# Patient Record
Sex: Female | Born: 1995 | Race: White | Hispanic: No | Marital: Married | Smoking: Never smoker
Health system: Southern US, Community
[De-identification: ages and names within clinical notes are randomized; demographics above are authoritative.]

## PROBLEM LIST (undated history)

## (undated) DIAGNOSIS — T7840XA Allergy, unspecified, initial encounter: Secondary | ICD-10-CM

## (undated) DIAGNOSIS — O139 Gestational [pregnancy-induced] hypertension without significant proteinuria, unspecified trimester: Secondary | ICD-10-CM

## (undated) DIAGNOSIS — J45909 Unspecified asthma, uncomplicated: Secondary | ICD-10-CM

## (undated) DIAGNOSIS — F419 Anxiety disorder, unspecified: Secondary | ICD-10-CM

## (undated) DIAGNOSIS — M2550 Pain in unspecified joint: Secondary | ICD-10-CM

## (undated) DIAGNOSIS — K219 Gastro-esophageal reflux disease without esophagitis: Secondary | ICD-10-CM

## (undated) DIAGNOSIS — D649 Anemia, unspecified: Secondary | ICD-10-CM

## (undated) DIAGNOSIS — E785 Hyperlipidemia, unspecified: Secondary | ICD-10-CM

## (undated) DIAGNOSIS — G935 Compression of brain: Secondary | ICD-10-CM

## (undated) DIAGNOSIS — M549 Dorsalgia, unspecified: Secondary | ICD-10-CM

## (undated) DIAGNOSIS — Z8711 Personal history of peptic ulcer disease: Secondary | ICD-10-CM

## (undated) DIAGNOSIS — R12 Heartburn: Secondary | ICD-10-CM

## (undated) DIAGNOSIS — Z8719 Personal history of other diseases of the digestive system: Secondary | ICD-10-CM

## (undated) HISTORY — DX: Gastro-esophageal reflux disease without esophagitis: K21.9

## (undated) HISTORY — DX: Allergy, unspecified, initial encounter: T78.40XA

## (undated) HISTORY — DX: Unspecified asthma, uncomplicated: J45.909

## (undated) HISTORY — PX: WISDOM TOOTH EXTRACTION: SHX21

## (undated) HISTORY — DX: Dorsalgia, unspecified: M54.9

## (undated) HISTORY — DX: Personal history of peptic ulcer disease: Z87.11

## (undated) HISTORY — DX: Compression of brain: G93.5

## (undated) HISTORY — DX: Anxiety disorder, unspecified: F41.9

## (undated) HISTORY — DX: Heartburn: R12

## (undated) HISTORY — DX: Pain in unspecified joint: M25.50

## (undated) HISTORY — DX: Hyperlipidemia, unspecified: E78.5

---
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
## 2016-04-10 DIAGNOSIS — F419 Anxiety disorder, unspecified: Secondary | ICD-10-CM | POA: Insufficient documentation

## 2017-10-23 DIAGNOSIS — J019 Acute sinusitis, unspecified: Secondary | ICD-10-CM | POA: Diagnosis not present

## 2017-11-02 ENCOUNTER — Encounter: Payer: Self-pay | Admitting: Pediatrics

## 2017-11-02 ENCOUNTER — Ambulatory Visit (INDEPENDENT_AMBULATORY_CARE_PROVIDER_SITE_OTHER): Payer: BLUE CROSS/BLUE SHIELD | Admitting: Pediatrics

## 2017-11-02 VITALS — BP 130/80 | HR 97 | Temp 98.5°F | Resp 18 | Ht 67.5 in | Wt 279.8 lb

## 2017-11-02 DIAGNOSIS — H1010 Acute atopic conjunctivitis, unspecified eye: Secondary | ICD-10-CM | POA: Diagnosis not present

## 2017-11-02 DIAGNOSIS — J4530 Mild persistent asthma, uncomplicated: Secondary | ICD-10-CM | POA: Diagnosis not present

## 2017-11-02 DIAGNOSIS — J3089 Other allergic rhinitis: Secondary | ICD-10-CM | POA: Diagnosis not present

## 2017-11-02 DIAGNOSIS — J302 Other seasonal allergic rhinitis: Secondary | ICD-10-CM | POA: Insufficient documentation

## 2017-11-02 HISTORY — DX: Acute atopic conjunctivitis, unspecified eye: H10.10

## 2017-11-02 HISTORY — DX: Mild persistent asthma, uncomplicated: J45.30

## 2017-11-02 HISTORY — DX: Other seasonal allergic rhinitis: J30.2

## 2017-11-02 MED ORDER — MONTELUKAST SODIUM 10 MG PO TABS: ORAL_TABLET | ORAL | 5 refills | Status: DC

## 2017-11-02 NOTE — Progress Notes (Signed)
[address] [place] [address] Dept: [phone]  New Patient Note  Patient ID: Ashley Acevedo, female    DOB: 1995/11/18  Age: 22 y.o. MRN: [id number] Date of Office Visit: 11/02/2017 Referring provider: No referring provider defined for this encounter.    Chief Complaint: Allergic Rhinitis   HPI Ashley Acevedo presents for evaluation of a allergic rhinitis since childhood. Her symptoms have been worse in the past 2 years. Her symptoms are perennial but worse in the spring and fall of the year. She has had seasonal asthma since age 72. She has aggravation of her symptoms on exposure to dust , cigarette smoke,  cats and dogs. Her boyfriend has a cat. She has 2 dogs in the home. Pineapple makes her mouth itch but no other allergic symptoms from it. She does not have a history of eczema or chronic hives.   Review of Systems  Constitutional: Negative.   HENT:       Allergic rhinitis since childhood  Eyes:       Itchy at times  Respiratory:       Asthmatic difficulties since age 40. Pneumonia once at age  21  Cardiovascular: Negative.   Gastrointestinal: Negative.   Genitourinary: Negative.   Musculoskeletal: Negative.   Skin: Negative.   Neurological: Negative.   Endo/Heme/Allergies:       No diabetes or thyroid disease  Psychiatric/Behavioral: Negative.     Outpatient Encounter Medications as of 11/02/2017  Medication Sig  . albuterol (PROAIR HFA) 108 (90 Base) MCG/ACT inhaler Inhale 2 puffs into the lungs every 6 (six) hours as needed for wheezing or shortness of breath.  . cetirizine (ZYRTEC) 10 MG tablet Take 10 mg by mouth daily.  . drospirenone-ethinyl estradiol (YAZ,GIANVI,LORYNA) 3-0.02 MG tablet Take 1 tablet by mouth daily.  . fluticasone (FLONASE) 50 MCG/ACT nasal spray Place 1 spray into both nostrils daily.  . Multiple Vitamins-Minerals (MULTIVITAMIN ADULT) CHEW Chew by mouth.  . montelukast (SINGULAIR) 10 MG tablet Take 1 tablet once a day for coughing or  wheezing   No facility-administered encounter medications on file as of 11/02/2017.      Drug Allergies:  Allergies  Allergen Reactions  . Codeine Rash  . Hydrocodone Rash    Family History: [name]'s family history includes Asthma in her maternal grandmother and mother... Sinus problems in her mother. There is no family history of hayfever, angioedema, eczema, hives, food allergies, chronic bronchitis or emphysema.  Social and environmental. She has 2 dogs indoors. Her boyfriend has a cat . She is not exposed to cigarette smoking. She has never smoked cigarettes. She works indoors. Her symptoms are not worse at work.  Physical Exam: BP 130/80   Pulse 97   Temp 98.5 F (36.9 C) (Oral)   Resp 18   Ht 5' 7.5" (1.715 m)   Wt 279 lb 12.8 oz (126.9 kg)   SpO2 98%   BMI 43.18 kg/m    Physical Exam  Constitutional: She is oriented to person, place, and time. She appears well-developed and well-nourished.  HENT:  Eyes normal. Ears normal. Nose moderate swelling of nasal turbinates. Pharynx normal.  Neck: Neck supple. No thyromegaly present.  Cardiovascular:  S1 and S2 normal no murmurs  Pulmonary/Chest:  Clear to percussion and auscultation  Abdominal: Soft. There is no tenderness (no hepatosplenomegaly).  Lymphadenopathy:    She has no cervical adenopathy.  Neurological: She is alert and oriented to person, place, and time.  Skin:  Clear  Psychiatric:  She has a normal mood and affect. Her behavior is normal. Judgment and thought content normal.  Vitals reviewed.   Diagnostics: FVC 4.25 L FEV1 2.93 L. Predicted FVC 4.31 L predicted FEV1 3.69 L. After albuterol 2 puffs FVC 4.29 L FEV1 3.13 L-this shows a mild reduction in the FEV1 with slight improvement after albuterol  Allergy skin tests were positive to grass pollens, weeds, tree pollens, dust mite, cat, dog, horse and some molds. Skin testing to pineapple was negative but result not always diagnostics for pineapple  allergy   Assessment  Assessment and Plan: 1. Mild persistent asthma without complication   2. Other allergic rhinitis   3. Seasonal allergic conjunctivitis     Meds ordered this encounter  Medications  . montelukast (SINGULAIR) 10 MG tablet    Sig: Take 1 tablet once a day for coughing or wheezing    Dispense:  30 tablet    Refill:  5    Patient Instructions  Environmental control of dust mite Keep the animals out of her bedroom Zyrtec 10 mg-take 1 tablet once a day for runny nose or itchy eyes Fluticasone 2 sprays per nostril at night for stuffy nose Opcon-A one drop 3 times a day if needed for itchy eyes Montelukast 10 mg-take 1 tablet once a day for coughing or wheezing Pro-air 2 puffs every 4 hours if needed for wheezing or coughing spells Add prednisone 20 mg twice a day for 3 days, 20 mg on the fourth day, 10 mg on the fifth day to bring your allergic symptoms under control Call me if you are not doing well on this treatment plan Avoid pineapples   Return in about 4 weeks (around 11/30/2017).   Thank you for the opportunity to care for this patient.  Please do not hesitate to contact me with questions.  Tonette BihariJ. A. Mililani Murthy, M.D.  [facility] [address] [place], [place] [address] [phone]

## 2017-11-02 NOTE — Patient Instructions (Addendum)
Environmental control of dust mite Keep the animals out of her bedroom Zyrtec 10 mg-take 1 tablet once a day for runny nose or itchy eyes Fluticasone 2 sprays per nostril at night for stuffy nose Opcon-A one drop 3 times a day if needed for itchy eyes Montelukast 10 mg-take 1 tablet once a day for coughing or wheezing Pro-air 2 puffs every 4 hours if needed for wheezing or coughing spells Add prednisone 20 mg twice a day for 3 days, 20 mg on the fourth day, 10 mg on the fifth day to bring your allergic symptoms under control Call me if you are not doing well on this treatment plan Avoid pineapples

## 2017-11-24 DIAGNOSIS — R509 Fever, unspecified: Secondary | ICD-10-CM | POA: Diagnosis not present

## 2017-11-24 DIAGNOSIS — J4520 Mild intermittent asthma, uncomplicated: Secondary | ICD-10-CM | POA: Diagnosis not present

## 2017-11-24 DIAGNOSIS — J101 Influenza due to other identified influenza virus with other respiratory manifestations: Secondary | ICD-10-CM | POA: Diagnosis not present

## 2017-12-03 ENCOUNTER — Encounter: Payer: Self-pay | Admitting: Allergy

## 2017-12-03 ENCOUNTER — Ambulatory Visit (INDEPENDENT_AMBULATORY_CARE_PROVIDER_SITE_OTHER): Payer: BLUE CROSS/BLUE SHIELD | Admitting: Allergy

## 2017-12-03 VITALS — BP 130/78 | HR 94 | Resp 18

## 2017-12-03 DIAGNOSIS — H1010 Acute atopic conjunctivitis, unspecified eye: Secondary | ICD-10-CM

## 2017-12-03 DIAGNOSIS — J101 Influenza due to other identified influenza virus with other respiratory manifestations: Secondary | ICD-10-CM | POA: Diagnosis not present

## 2017-12-03 DIAGNOSIS — J3089 Other allergic rhinitis: Secondary | ICD-10-CM | POA: Diagnosis not present

## 2017-12-03 DIAGNOSIS — J4530 Mild persistent asthma, uncomplicated: Secondary | ICD-10-CM

## 2017-12-03 DIAGNOSIS — T781XXD Other adverse food reactions, not elsewhere classified, subsequent encounter: Secondary | ICD-10-CM

## 2017-12-03 NOTE — Patient Instructions (Signed)
Asthma    - Montelukast 10mg  daily    - Pro-air 2 puffs every 4 hours if needed for wheezing or coughing spells Asthma control goals:   Full participation in all desired activities (may need albuterol before activity)  Albuterol use two time or less a week on average (not counting use with activity)  Cough interfering with sleep two time or less a month  Oral steroids no more than once a year  No hospitalizations   Allergic rhinoconjunctivitis    - Environmental control of dust mite    - Keep the animals out of her bedroom    - Zyrtec 10 mg-take 1 tablet once a day for runny nose or itchy eyes    - Fluticasone 2 sprays per nostril at night for stuffy nose    - Opcon-A one drop 3 times a day if needed for itchy eyes  Oral allergy syndrome    - Avoid pineapples   Follow-up 3 months or sooner if needed

## 2017-12-03 NOTE — Progress Notes (Signed)
Follow-up Note  RE: Ashley Acevedo MRN: [id number] DOB: 04/03/1996 Date of Office Visit: 12/03/2017   History of present illness: Ashley Acevedo is a 22 y.o. female presenting today for follow-up of  asthma and allergic rhinoconjunctivitis.  She was last seen in the office on November 02, 2017 by Dr. Beaulah DinningBardelas.  At that time she was having issues with her breathing she was provided with a prednisone 5-day course as well as started on Singulair.  She states she did not notice a difference in her breathing after starting on Singulair.  However last week she developed flulike symptoms and did test positive for flu A.  She completed Tamiflu course.  She states that her boyfriend was sick first with cold-like symptoms that now she knows was flu symptoms.  She states she has improved but she still has some nighttime cough.  She is not needing to use cough medicine anymore.  She also has not had any further fever since initial onset of symptoms.  She does take Zyrtec and Flonase daily.  He has not needed to use her eyedrop.  Continues to avoid pineapples. She is potentially interested in allergy shots as her boyfriend does have a cat.  She states however the cat belongs to his parents and they will be out of the country for 2 years.  She also states that the cat is quite old and thus the cat may die before the parents return to reconnect with the cat.  She is not interested in having any cats in the future.  Review of systems: Review of Systems  Constitutional: Positive for fever. Negative for chills and malaise/fatigue.  HENT: Positive for congestion. Negative for ear discharge, ear pain, nosebleeds, sinus pain, sore throat and tinnitus.   Eyes: Negative for pain, discharge and redness.  Respiratory: Positive for cough and shortness of breath.   Cardiovascular: Negative for chest pain.  Gastrointestinal: Negative for abdominal pain, constipation, diarrhea, heartburn, nausea and vomiting.    Musculoskeletal: Positive for myalgias. Negative for joint pain.  Skin: Negative for itching and rash.  Neurological: Negative for headaches.    All other systems negative unless noted above in HPI  Past medical/social/surgical/family history have been reviewed and are unchanged unless specifically indicated below.  No changes  Medication List: Allergies as of 12/03/2017      Reactions   Codeine Rash   Hydrocodone Rash      Medication List        Accurate as of 12/03/17 12:26 PM. Always use your most recent med list.          cetirizine 10 MG tablet Commonly known as:  ZYRTEC Take 10 mg by mouth daily.   drospirenone-ethinyl estradiol 3-0.02 MG tablet Commonly known as:  YAZ,GIANVI,LORYNA Take 1 tablet by mouth daily.   fluticasone 50 MCG/ACT nasal spray Commonly known as:  FLONASE Place 1 spray into both nostrils daily.   montelukast 10 MG tablet Commonly known as:  SINGULAIR Take 1 tablet once a day for coughing or wheezing   MULTIVITAMIN ADULT Chew Chew by mouth.   PROAIR HFA 108 (90 Base) MCG/ACT inhaler Generic drug:  albuterol Inhale 2 puffs into the lungs every 6 (six) hours as needed for wheezing or shortness of breath.       Known medication allergies: Allergies  Allergen Reactions  . Codeine Rash  . Hydrocodone Rash     Physical examination: Blood pressure 130/78, pulse 94, resp. rate 18, SpO2 95 %.  General: Alert, interactive, in no acute distress. HEENT: PERRLA, TMs pearly gray, turbinates mildly edematous with clear discharge, post-pharynx non erythematous. Neck: Supple without lymphadenopathy. Lungs: Clear to auscultation without wheezing, rhonchi or rales. {no increased work of breathing. CV: Normal S1, S2 without murmurs. Abdomen: Nondistended, nontender. Skin: Warm and dry, without lesions or rashes. Extremities:  No clubbing, cyanosis or edema. Neuro:   Grossly intact.  Diagnositics/Labs:  Spirometry: FEV1: 3.07L 108%, FVC:  4.03L 116%, ratio consistent with Nonobstructive pattern  Assessment and plan:   Asthma, mild persistent    - Montelukast 10mg  daily    - Pro-air 2 puffs every 4 hours if needed for wheezing or coughing spells Asthma control goals:   Full participation in all desired activities (may need albuterol before activity)  Albuterol use two time or less a week on average (not counting use with activity)  Cough interfering with sleep two time or less a month  Oral steroids no more than once a year  No hospitalizations   Allergic rhinoconjunctivitis    - Environmental control of dust mite    - Keep the animals out of her bedroom    - Zyrtec 10 mg-take 1 tablet once a day for runny nose or itchy eyes    - Fluticasone 2 sprays per nostril at night for stuffy nose    - Opcon-A one drop 3 times a day if needed for itchy eyes    -Allergen immunotherapy discussed today including protocol, benefits and risks.  Also provided with the informational packet.  She will think over this option as she knows she cats exposure in the home is only temporary.    Oral allergy syndrome    - Avoid pineapples  Recent influenza type A   -Completed Tamiflu course.  She is improving.  She still has some residual cough  Follow-up 3 months or sooner if needed   I appreciate the opportunity to take part in [name]'s care. Please do not hesitate to contact me with questions.  Sincerely,   Margo Aye, MD Allergy/Immunology [facility]

## 2017-12-11 ENCOUNTER — Ambulatory Visit: Payer: BLUE CROSS/BLUE SHIELD | Admitting: Podiatry

## 2018-01-06 DIAGNOSIS — L600 Ingrowing nail: Secondary | ICD-10-CM | POA: Diagnosis not present

## 2018-03-09 ENCOUNTER — Telehealth: Payer: Self-pay | Admitting: Allergy

## 2018-03-09 NOTE — Telephone Encounter (Signed)
Patient may have a sunscreen allergy?? Can this be tested?? Please call patient to answer any questions or her breakouts

## 2018-03-09 NOTE — Telephone Encounter (Signed)
I spoke with patient and she said it is no one sunscreen that she has the issue with. She has a red raised rash. I have her scheduled 03-22-18 for office visit and a possible patch placement. Please advise with any further information or recommendations.

## 2018-03-09 NOTE — Telephone Encounter (Signed)
If she can make of the sunscreens she has developed a rash with that will be helpful.  There are 2 major types of sunscreen the chemical sunscreen vs physical blockers.  It would be helpful if she has this information for the visit.  And if she wants to perform patch testing to the sunscreens she has reacted to she can bring then in for the visit.

## 2018-03-09 NOTE — Telephone Encounter (Signed)
Patient has been advised and will do as recommended.

## 2018-03-22 ENCOUNTER — Ambulatory Visit (INDEPENDENT_AMBULATORY_CARE_PROVIDER_SITE_OTHER): Payer: BLUE CROSS/BLUE SHIELD | Admitting: Allergy

## 2018-03-22 ENCOUNTER — Encounter: Payer: Self-pay | Admitting: Allergy

## 2018-03-22 VITALS — BP 116/84 | HR 76 | Resp 20

## 2018-03-22 DIAGNOSIS — J3089 Other allergic rhinitis: Secondary | ICD-10-CM

## 2018-03-22 DIAGNOSIS — L253 Unspecified contact dermatitis due to other chemical products: Secondary | ICD-10-CM

## 2018-03-22 DIAGNOSIS — J4530 Mild persistent asthma, uncomplicated: Secondary | ICD-10-CM | POA: Diagnosis not present

## 2018-03-22 DIAGNOSIS — T781XXD Other adverse food reactions, not elsewhere classified, subsequent encounter: Secondary | ICD-10-CM | POA: Diagnosis not present

## 2018-03-22 NOTE — Progress Notes (Signed)
Follow-up Note  RE: Ashley Acevedo MRN: [id number] DOB: 27-Nov-1995 Date of Office Visit: 03/22/2018   History of present illness: Ashley Acevedo is a 22 y.o. female presenting today for evaluation of sunscreen allergy.  She was last seen in the office on December 03, 2017 by myself.  She has a history of asthma, allergic rhinoconjunctivitis and oral allergy syndrome to pineapples.  She states when she was a child there was a sunscreen that she had a reaction to and she stopped using that particular sunscreen.  Then again last summer she reported using 2 different types of sunscreens and developed a very itchy bumpy rash all over her body with sun exposure.  She was seen at an urgent care and was prescribed prednisone for the rash.  She avoided asthma sun exposure as she could and did not use any more of the sunscreen.  The most recently over [facility] she reports using the Sun Bum brand sunscreen and she used a different one on her face than she did for her body.  The one she used on her face cause her to break out again similarly to the rash she had last summer.  She did not break out on the areas on her body where she put the different sunscreen on.  That she is concerned for sunscreen allergy and is here for patch testing today.   She has brought with her 6 different types of sunscreen that she has used in the past 1 of them she recalls not having any symptoms with the rest that she has reacted to. In regards to her other atopic issues she states that her asthma is doing very well on Singulair alone.  She really uses her albuterol and does recall using it this past weekend while she was off her antihistamines and staying at her parents house that have a dog.  She has not required any oral steroids or ED or urgent care visits for her breathing since her last visit. With her allergies they are well controlled with use of Zyrtec daily and fluticasone daily. She continues to avoid  pineapples.   Review of systems: Review of Systems  Constitutional: Negative for chills, fever and malaise/fatigue.  HENT: Negative for congestion, ear discharge, ear pain, nosebleeds and sore throat.   Eyes: Negative for pain, discharge and redness.  Respiratory: Positive for cough. Negative for shortness of breath and wheezing.   Cardiovascular: Negative for chest pain.  Gastrointestinal: Negative for abdominal pain, constipation, diarrhea, heartburn, nausea and vomiting.  Musculoskeletal: Negative for joint pain.  Skin: Negative for itching and rash.  Neurological: Negative for headaches.    All other systems negative unless noted above in HPI  Past medical/social/surgical/family history have been reviewed and are unchanged unless specifically indicated below.  No changes  Medication List: Allergies as of 03/22/2018      Reactions   Codeine Rash   Hydrocodone Rash      Medication List        Accurate as of 03/22/18 12:41 PM. Always use your most recent med list.          cetirizine 10 MG tablet Commonly known as:  ZYRTEC Take 10 mg by mouth daily.   drospirenone-ethinyl estradiol 3-0.02 MG tablet Commonly known as:  YAZ,GIANVI,LORYNA Take 1 tablet by mouth daily.   fluticasone 50 MCG/ACT nasal spray Commonly known as:  FLONASE Place 1 spray into both nostrils daily.   montelukast 10 MG tablet Commonly known as:  SINGULAIR Take 1 tablet once a day for coughing or wheezing   MULTIVITAMIN ADULT Chew Chew by mouth.   PROAIR HFA 108 (90 Base) MCG/ACT inhaler Generic drug:  albuterol Inhale 2 puffs into the lungs every 6 (six) hours as needed for wheezing or shortness of breath.       Known medication allergies: Allergies  Allergen Reactions  . Codeine Rash  . Hydrocodone Rash     Physical examination: Blood pressure 116/84, pulse 76, resp. rate 20, SpO2 98 %.  General: Alert, interactive, in no acute distress. HEENT: PERRLA, TMs pearly gray,  turbinates minimally edematous without discharge, post-pharynx non erythematous. Neck: Supple without lymphadenopathy. Lungs: Clear to auscultation without wheezing, rhonchi or rales. {no increased work of breathing. CV: Normal S1, S2 without murmurs. Abdomen: Nondistended, nontender. Skin: Warm and dry, without lesions or rashes. Extremities:  No clubbing, cyanosis or edema. Neuro:   Grossly intact.  Diagnositics/Labs:  Spirometry: FEV1: 3.12L  83%, FVC: 3.91L  89%, ratio consistent with Nonobstructive pattern   Assessment and plan:   Contact dermatitis   - possible allergy to chemical sunscreens   - TRUE test patches placed as well as testing for the sunscreens she has brought in today   -She has been advised to avoid getting the patches wet and she will return in 2 days for initial reading and then again in 4 days for final reading.  - She has been advised that she can take her antihistamines while the patches are in place     Asthma, mild persistent    - Montelukast 10mg  daily    - Pro-air 2 puffs every 4 hours if needed for wheezing or coughing spells Asthma control goals:   Full participation in all desired activities (may need albuterol before activity)  Albuterol use two time or less a week on average (not counting use with activity)  Cough interfering with sleep two time or less a month  Oral steroids no more than once a year  No hospitalizations   Allergic rhinoconjunctivitis    - Environmental control of dust mite    - Keep the animals out of her bedroom    - Zyrtec 10 mg-take 1 tablet once a day for runny nose or itchy eyes    - Fluticasone 2 sprays per nostril at night for stuffy nose    - Opcon-A one drop 3 times a day if needed for itchy eyes  Oral allergy syndrome    - Avoid pineapples   Follow-up 2 days for patch test reading  I appreciate the opportunity to take part in Tynasia's care. Please do not hesitate to contact me with  questions.  Sincerely,   Margo AyeShaylar Asuna Gallentine, MD Allergy/Immunology [facility]

## 2018-03-22 NOTE — Patient Instructions (Addendum)
Contact dermatitis   - possible allergy to chemical sunscreens   - TRUE test patches placed as well as testing for the sunscreens she has brought in today   -She has been advised to avoid getting the patches wet and she will return in 2 days for initial reading and then again in 4 days for final reading.  - She has been advised that she can take her antihistamines while the patches are in place     Asthma    - Montelukast 10mg  daily    - Pro-air 2 puffs every 4 hours if needed for wheezing or coughing spells Asthma control goals:   Full participation in all desired activities (may need albuterol before activity)  Albuterol use two time or less a week on average (not counting use with activity)  Cough interfering with sleep two time or less a month  Oral steroids no more than once a year  No hospitalizations   Allergic rhinoconjunctivitis    - Environmental control of dust mite    - Keep the animals out of her bedroom    - Zyrtec 10 mg-take 1 tablet once a day for runny nose or itchy eyes    - Fluticasone 2 sprays per nostril at night for stuffy nose    - Opcon-A one drop 3 times a day if needed for itchy eyes  Oral allergy syndrome    - Avoid pineapples   Follow-up 2 days for patch test reading

## 2018-03-24 ENCOUNTER — Encounter: Payer: Self-pay | Admitting: Allergy & Immunology

## 2018-03-24 ENCOUNTER — Ambulatory Visit: Payer: BLUE CROSS/BLUE SHIELD | Admitting: Allergy & Immunology

## 2018-03-24 DIAGNOSIS — L253 Unspecified contact dermatitis due to other chemical products: Secondary | ICD-10-CM

## 2018-03-24 NOTE — Progress Notes (Signed)
    Follow-up Note  RE: Ashley Acevedo MRN: [id number] DOB: 04/28/1996 Date of Office Visit: 03/24/2018  Primary care provider: Ward, Clois ComberKimberly Dawn, FNP Referring provider: Ward, Clois ComberKimberly Dawn, FNP   Ashley Acevedo returns to the office today for the initial patch test interpretation, given suspected history of contact dermatitis.    Diagnostics:   TRUE TEST 48-hour hour reading: equivocal reaction to #18 (Quaternium-15), mild reaction to #25 (Diazolidinyl urea), mild reaction to #31 (Hydrocortisone-17-butyrate) and mild reaction to #32 (Mercapto-benzothiazole)   She also had several samples of sunscreen placed onto her back. These were all negative today.   Plan:   Allergic contact dermatitis - The patient has been provided detailed information regarding the substances she is sensitive to, as well as products containing the substances.   - Meticulous avoidance of these substances is recommended.  - If avoidance is not possible, the use of barrier creams or lotions is recommended. - If symptoms persist or progress despite meticulous avoidance of the above chemicals, Dermatology Referral may be warranted.   Malachi BondsJoel Shallyn Constancio, MD  [facility]

## 2018-03-26 ENCOUNTER — Encounter: Payer: Self-pay | Admitting: Family Medicine

## 2018-03-26 ENCOUNTER — Ambulatory Visit: Payer: BLUE CROSS/BLUE SHIELD | Admitting: Family Medicine

## 2018-03-26 DIAGNOSIS — L253 Unspecified contact dermatitis due to other chemical products: Secondary | ICD-10-CM | POA: Insufficient documentation

## 2018-03-26 NOTE — Progress Notes (Signed)
   [address] [place] [address] Dept: [phone]  FOLLOW UP NOTE  Patient ID: Ashley Acevedo, female    DOB: 12/28/1995  Age: 22 y.o. MRN: [id number] Date of Office Visit: 03/26/2018  Assessment  Chief Complaint: Allergy Testing (Patch Read )  HPI Ashley Acevedo is a 22 year old female who presents to the clinic for the final 72 hour patch reading given the suspected history of contact dermatitis.   Drug Allergies:  Allergies  Allergen Reactions  . Codeine Rash  . Hydrocodone Rash    Physical Exam: There were no vitals taken for this visit.   Physical Exam  Diagnostics: TrueTest patch test was negative for all metals. Sunscreen panel was negative. Sunscreens used in the panel are as follows: left to right top to bottom: Ultra Sheer Neutrogena, Neutrogena Clear Face, Neutrogena Hydra Boost, Sun Bum Mineral, Sun Bum SPF 30, Ocean Potion    Assessment and Plan: 1. 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 Call the office if your symptoms worsen or you have questions.   Return if symptoms worsen or fail to improve.    Thank you for the opportunity to care for this patient.  Please do not hesitate to contact me with questions.  Thermon LeylandAnne Daemion Mcniel, FNP [facility]  I have provided oversight concerning Thermon Leylandnne Deondra Wigger' evaluation and treatment of this patient's health issues addressed during today's encounter. I agree with the assessment and therapeutic plan as outlined in the note.   Thank you for the opportunity to care for this patient.  Please do not hesitate to contact me with questions.  Tonette BihariJ. A. Bardelas, M.D.  [facility] [address] [place], [place] [address] [phone]

## 2018-03-26 NOTE — Patient Instructions (Addendum)
The metas and sunscreen panels were all negative today Try Coppertone for Babies on an isolated spot and see if you develop a rash (not on your face) Call us if this is not working for you Apply Eucrissa to red or itchy areas twice a day as needed  Follow up as needed. Call the office if your symptoms worsen or you have questions.

## 2018-07-20 DIAGNOSIS — L814 Other melanin hyperpigmentation: Secondary | ICD-10-CM | POA: Diagnosis not present

## 2018-07-20 DIAGNOSIS — D2262 Melanocytic nevi of left upper limb, including shoulder: Secondary | ICD-10-CM | POA: Diagnosis not present

## 2018-07-20 DIAGNOSIS — D225 Melanocytic nevi of trunk: Secondary | ICD-10-CM | POA: Diagnosis not present

## 2018-07-20 DIAGNOSIS — Z411 Encounter for cosmetic surgery: Secondary | ICD-10-CM | POA: Diagnosis not present

## 2018-08-16 ENCOUNTER — Other Ambulatory Visit: Payer: Self-pay | Admitting: Allergy

## 2018-08-16 DIAGNOSIS — F419 Anxiety disorder, unspecified: Secondary | ICD-10-CM | POA: Diagnosis not present

## 2018-08-16 DIAGNOSIS — J4520 Mild intermittent asthma, uncomplicated: Secondary | ICD-10-CM | POA: Diagnosis not present

## 2018-08-16 MED ORDER — MONTELUKAST SODIUM 10 MG PO TABS: ORAL_TABLET | ORAL | 5 refills | Status: DC

## 2018-08-31 DIAGNOSIS — L600 Ingrowing nail: Secondary | ICD-10-CM | POA: Diagnosis not present

## 2018-09-29 DIAGNOSIS — J019 Acute sinusitis, unspecified: Secondary | ICD-10-CM | POA: Diagnosis not present

## 2018-10-18 DIAGNOSIS — Z6841 Body Mass Index (BMI) 40.0 and over, adult: Secondary | ICD-10-CM | POA: Diagnosis not present

## 2018-10-18 DIAGNOSIS — Z01419 Encounter for gynecological examination (general) (routine) without abnormal findings: Secondary | ICD-10-CM | POA: Diagnosis not present

## 2018-10-18 DIAGNOSIS — Z113 Encounter for screening for infections with a predominantly sexual mode of transmission: Secondary | ICD-10-CM | POA: Diagnosis not present

## 2018-10-18 DIAGNOSIS — Z130 Encounter for screening for diseases of the blood and blood-forming organs and certain disorders involving the immune mechanism: Secondary | ICD-10-CM | POA: Diagnosis not present

## 2018-10-18 DIAGNOSIS — Z1389 Encounter for screening for other disorder: Secondary | ICD-10-CM | POA: Diagnosis not present

## 2018-10-19 DIAGNOSIS — Z124 Encounter for screening for malignant neoplasm of cervix: Secondary | ICD-10-CM | POA: Diagnosis not present

## 2018-10-19 DIAGNOSIS — Z1151 Encounter for screening for human papillomavirus (HPV): Secondary | ICD-10-CM | POA: Diagnosis not present

## 2018-10-25 DIAGNOSIS — J301 Allergic rhinitis due to pollen: Secondary | ICD-10-CM | POA: Diagnosis not present

## 2018-10-25 DIAGNOSIS — J343 Hypertrophy of nasal turbinates: Secondary | ICD-10-CM | POA: Diagnosis not present

## 2018-10-25 DIAGNOSIS — J019 Acute sinusitis, unspecified: Secondary | ICD-10-CM | POA: Diagnosis not present

## 2018-10-25 DIAGNOSIS — J342 Deviated nasal septum: Secondary | ICD-10-CM | POA: Diagnosis not present

## 2018-11-17 DIAGNOSIS — J329 Chronic sinusitis, unspecified: Secondary | ICD-10-CM | POA: Diagnosis not present

## 2018-11-29 DIAGNOSIS — G44201 Tension-type headache, unspecified, intractable: Secondary | ICD-10-CM | POA: Diagnosis not present

## 2018-11-29 DIAGNOSIS — M531 Cervicobrachial syndrome: Secondary | ICD-10-CM | POA: Diagnosis not present

## 2018-11-29 DIAGNOSIS — M9900 Segmental and somatic dysfunction of head region: Secondary | ICD-10-CM | POA: Diagnosis not present

## 2018-11-29 DIAGNOSIS — M9901 Segmental and somatic dysfunction of cervical region: Secondary | ICD-10-CM | POA: Diagnosis not present

## 2018-12-01 DIAGNOSIS — M531 Cervicobrachial syndrome: Secondary | ICD-10-CM | POA: Diagnosis not present

## 2018-12-01 DIAGNOSIS — M9900 Segmental and somatic dysfunction of head region: Secondary | ICD-10-CM | POA: Diagnosis not present

## 2018-12-01 DIAGNOSIS — G44201 Tension-type headache, unspecified, intractable: Secondary | ICD-10-CM | POA: Diagnosis not present

## 2018-12-01 DIAGNOSIS — M9901 Segmental and somatic dysfunction of cervical region: Secondary | ICD-10-CM | POA: Diagnosis not present

## 2018-12-08 DIAGNOSIS — G44201 Tension-type headache, unspecified, intractable: Secondary | ICD-10-CM | POA: Diagnosis not present

## 2018-12-08 DIAGNOSIS — M531 Cervicobrachial syndrome: Secondary | ICD-10-CM | POA: Diagnosis not present

## 2018-12-08 DIAGNOSIS — M9900 Segmental and somatic dysfunction of head region: Secondary | ICD-10-CM | POA: Diagnosis not present

## 2018-12-08 DIAGNOSIS — M9901 Segmental and somatic dysfunction of cervical region: Secondary | ICD-10-CM | POA: Diagnosis not present

## 2018-12-11 DIAGNOSIS — M9900 Segmental and somatic dysfunction of head region: Secondary | ICD-10-CM | POA: Diagnosis not present

## 2018-12-11 DIAGNOSIS — M9901 Segmental and somatic dysfunction of cervical region: Secondary | ICD-10-CM | POA: Diagnosis not present

## 2018-12-11 DIAGNOSIS — M531 Cervicobrachial syndrome: Secondary | ICD-10-CM | POA: Diagnosis not present

## 2018-12-11 DIAGNOSIS — G44201 Tension-type headache, unspecified, intractable: Secondary | ICD-10-CM | POA: Diagnosis not present

## 2018-12-13 DIAGNOSIS — M9901 Segmental and somatic dysfunction of cervical region: Secondary | ICD-10-CM | POA: Diagnosis not present

## 2018-12-13 DIAGNOSIS — M531 Cervicobrachial syndrome: Secondary | ICD-10-CM | POA: Diagnosis not present

## 2018-12-13 DIAGNOSIS — M9900 Segmental and somatic dysfunction of head region: Secondary | ICD-10-CM | POA: Diagnosis not present

## 2018-12-13 DIAGNOSIS — G44201 Tension-type headache, unspecified, intractable: Secondary | ICD-10-CM | POA: Diagnosis not present

## 2018-12-15 DIAGNOSIS — G44201 Tension-type headache, unspecified, intractable: Secondary | ICD-10-CM | POA: Diagnosis not present

## 2018-12-15 DIAGNOSIS — M9901 Segmental and somatic dysfunction of cervical region: Secondary | ICD-10-CM | POA: Diagnosis not present

## 2018-12-15 DIAGNOSIS — M531 Cervicobrachial syndrome: Secondary | ICD-10-CM | POA: Diagnosis not present

## 2018-12-15 DIAGNOSIS — M9900 Segmental and somatic dysfunction of head region: Secondary | ICD-10-CM | POA: Diagnosis not present

## 2018-12-22 DIAGNOSIS — M9901 Segmental and somatic dysfunction of cervical region: Secondary | ICD-10-CM | POA: Diagnosis not present

## 2018-12-22 DIAGNOSIS — M9900 Segmental and somatic dysfunction of head region: Secondary | ICD-10-CM | POA: Diagnosis not present

## 2018-12-22 DIAGNOSIS — M531 Cervicobrachial syndrome: Secondary | ICD-10-CM | POA: Diagnosis not present

## 2018-12-22 DIAGNOSIS — G44201 Tension-type headache, unspecified, intractable: Secondary | ICD-10-CM | POA: Diagnosis not present

## 2018-12-25 DIAGNOSIS — G44201 Tension-type headache, unspecified, intractable: Secondary | ICD-10-CM | POA: Diagnosis not present

## 2018-12-25 DIAGNOSIS — M9901 Segmental and somatic dysfunction of cervical region: Secondary | ICD-10-CM | POA: Diagnosis not present

## 2018-12-25 DIAGNOSIS — M9900 Segmental and somatic dysfunction of head region: Secondary | ICD-10-CM | POA: Diagnosis not present

## 2018-12-25 DIAGNOSIS — M531 Cervicobrachial syndrome: Secondary | ICD-10-CM | POA: Diagnosis not present

## 2018-12-29 DIAGNOSIS — G44201 Tension-type headache, unspecified, intractable: Secondary | ICD-10-CM | POA: Diagnosis not present

## 2018-12-29 DIAGNOSIS — M9900 Segmental and somatic dysfunction of head region: Secondary | ICD-10-CM | POA: Diagnosis not present

## 2018-12-29 DIAGNOSIS — M9901 Segmental and somatic dysfunction of cervical region: Secondary | ICD-10-CM | POA: Diagnosis not present

## 2018-12-29 DIAGNOSIS — M531 Cervicobrachial syndrome: Secondary | ICD-10-CM | POA: Diagnosis not present

## 2019-08-10 DIAGNOSIS — Z0000 Encounter for general adult medical examination without abnormal findings: Secondary | ICD-10-CM | POA: Diagnosis not present

## 2019-08-10 DIAGNOSIS — F419 Anxiety disorder, unspecified: Secondary | ICD-10-CM | POA: Diagnosis not present

## 2019-08-10 DIAGNOSIS — J301 Allergic rhinitis due to pollen: Secondary | ICD-10-CM | POA: Diagnosis not present

## 2019-08-10 DIAGNOSIS — Z6841 Body Mass Index (BMI) 40.0 and over, adult: Secondary | ICD-10-CM | POA: Diagnosis not present

## 2019-08-10 DIAGNOSIS — J4520 Mild intermittent asthma, uncomplicated: Secondary | ICD-10-CM | POA: Diagnosis not present

## 2019-08-24 ENCOUNTER — Other Ambulatory Visit: Payer: Self-pay

## 2019-08-24 ENCOUNTER — Encounter: Payer: Self-pay | Admitting: Family Medicine

## 2019-08-24 ENCOUNTER — Encounter (INDEPENDENT_AMBULATORY_CARE_PROVIDER_SITE_OTHER): Payer: Self-pay | Admitting: Family Medicine

## 2019-08-24 ENCOUNTER — Ambulatory Visit (INDEPENDENT_AMBULATORY_CARE_PROVIDER_SITE_OTHER): Payer: BC Managed Care – PPO | Admitting: Family Medicine

## 2019-08-24 VITALS — BP 122/83 | HR 109 | Temp 98.0°F | Ht 68.0 in | Wt 295.0 lb

## 2019-08-24 DIAGNOSIS — Z9189 Other specified personal risk factors, not elsewhere classified: Secondary | ICD-10-CM

## 2019-08-24 DIAGNOSIS — R0602 Shortness of breath: Secondary | ICD-10-CM | POA: Diagnosis not present

## 2019-08-24 DIAGNOSIS — R5383 Other fatigue: Secondary | ICD-10-CM

## 2019-08-24 DIAGNOSIS — R739 Hyperglycemia, unspecified: Secondary | ICD-10-CM

## 2019-08-24 DIAGNOSIS — Z6841 Body Mass Index (BMI) 40.0 and over, adult: Secondary | ICD-10-CM

## 2019-08-24 DIAGNOSIS — F3289 Other specified depressive episodes: Secondary | ICD-10-CM

## 2019-08-24 DIAGNOSIS — E7849 Other hyperlipidemia: Secondary | ICD-10-CM | POA: Diagnosis not present

## 2019-08-24 DIAGNOSIS — J45909 Unspecified asthma, uncomplicated: Secondary | ICD-10-CM

## 2019-08-24 DIAGNOSIS — K219 Gastro-esophageal reflux disease without esophagitis: Secondary | ICD-10-CM

## 2019-08-24 DIAGNOSIS — Z0289 Encounter for other administrative examinations: Secondary | ICD-10-CM

## 2019-08-24 NOTE — Progress Notes (Signed)
.  Office: [phone]  /  Fax: [phone]   HPI:   Chief Complaint: OBESITY  Ashley Acevedo (MR# [id number]) is a 23 y.o. female who presents on 08/24/2019 for obesity evaluation and treatment. Current BMI is Body mass index is 44.85 kg/m.Marland Kitchen. Thao has struggled with obesity for years and has been unsuccessful in either losing weight or maintaining long term weight loss. Sera attended our information session and states she is currently in the action stage of change and ready to dedicate time achieving and maintaining a healthier weight.   Rin's mom is a patient here. [facility]  Janelie states her family eats meals together she thinks her family will eat healthier with her her desired weight loss is 80-95 lbs she has been heavy most of her life her heaviest weight ever was 295 lbs. she skips breakfast most weekdays she is frequently drinking liquids with calories she sometimes makes poor food choices she frequently eats larger portions than normal  she has binge eating behaviors she struggles with emotional eating    Fatigue Eloina feels her energy is lower than it should be. This has worsened with weight gain and has worsened recently. Elinora denies daytime somnolence and  denies waking up still tired. Patient generally gets 6-8 hours of sleep per night, and states they generally have restful sleep. Snoring is present. Apneic episodes are not present. Epworth Sleepiness Score is 3.  Dyspnea on exertion Malisha notes increasing shortness of breath with exercising and seems to be worsening over time with weight gain. She notes getting out of breath sooner with activity than she used to. This has not gotten worse recently. Estephanie denies orthopnea.  Hyperglycemia Katiria has a history of some elevated blood glucose readings without a diagnosis of diabetes. She craves bread and drinks sugary drinks, i.e., juice, tea, and regular soda.  At risk for diabetes Yevonne is at  higher than average risk for developing diabetes due to her obesity. She currently denies polyuria or polydipsia.  Depression with emotional eating behaviors Deyjah is struggling with emotional eating and using food for comfort to the extent that it is negatively impacting her health. She often snacks when she is not hungry. Adelin sometimes feels she is out of control and then feels guilty that she made poor food choices. She has been working on behavior modification techniques to help reduce her emotional eating and has been somewhat successful. Jerri reports increased weight gain with COVID/stress and increased portions. She shows no sign of suicidal or homicidal ideations.  Depression Screen Kalysta's Food and Mood (modified PHQ-9) score was 8. Depression screen PHQ 2/9 08/24/2019  Decreased Interest 1  Down, Depressed, Hopeless 1  PHQ - 2 Score 2  Altered sleeping 1  Tired, decreased energy 2  Change in appetite 1  Feeling bad or failure about yourself  1  Trouble concentrating 1  Moving slowly or fidgety/restless 0  Suicidal thoughts 0  PHQ-9 Score 8  Difficult doing work/chores Not difficult at all   Hyperlipidemia Lashane has hyperlipidemia and has been trying to improve her cholesterol levels with intensive lifestyle modification including a low saturated fat diet, exercise and weight loss. We reviewed her recent FLP she had at her PCP's. HDL in the high 70's, which is good.  She denies any chest pain, claudication or myalgias.  Asthma Semaj has a diagnosis of asthma, which is stable.  Gastroesophageal Reflux Disease (GERD) Priscillia has a diagnosis of GERD, which is stable.  ASSESSMENT AND PLAN:  Other fatigue - Plan: EKG 12-Lead, Anemia panel, Vitamin D (25 hydroxy)  Shortness of breath on exertion  Other hyperlipidemia  Hyperglycemia - Plan: HgB A1c, Insulin, random  Asthma, unspecified asthma severity, unspecified whether complicated, unspecified whether  persistent  Gastroesophageal reflux disease, unspecified whether esophagitis present  Other depression - with emotional eating  At risk for diabetes mellitus  Class 3 severe obesity with serious comorbidity and body mass index (BMI) of 45.0 to 49.9 in adult, unspecified obesity type ([place])  PLAN:  Fatigue Jeanine was informed that her fatigue may be related to obesity, depression or many other causes. Labs will be ordered, and in the meanwhile Aneka has agreed to work on diet, exercise and weight loss to help with fatigue. Proper sleep hygiene was discussed including the need for 7-8 hours of quality sleep each night. A sleep study was not ordered based on symptoms and Epworth score. EKG within normal limits. She has a history of decreased MCV. Will check labs.  Dyspnea on exertion Lachrisha's shortness of breath appears to be obesity related and exercise induced. IC was reviewed. Will check anemia panel. She has agreed to work on weight loss and gradually increase exercise to treat her exercise induced shortness of breath. If Zulma follows our instructions and loses weight without improvement of her shortness of breath, we will plan to refer to pulmonology. We will monitor this condition regularly. Malena agrees to this plan.  Hyperglycemia Fasting labs will be obtained and results with be discussed with Madalina in 2 weeks at her follow-up visit. In the meanwhile Jaiyanna was started on a lower simple carbohydrate diet and will work on weight loss efforts. She was instructed to increase her water intake.  Diabetes risk counseling Senya was given extended (15 minutes) diabetes prevention counseling today. She is 23 y.o. female and has risk factors for diabetes including obesity. We discussed intensive lifestyle modifications today with an emphasis on weight loss as well as increasing exercise and decreasing simple carbohydrates in her diet.  Depression with Emotional Eating Behaviors We discussed  behavior modification techniques today to help Tifany deal with her emotional eating and depression. Harvest declines seeing Dr. Mallie Mussel, our bariatric psychologist, at this time.  Depression Screen Safira had a mildly positive depression screening. Depression is commonly associated with obesity and often results in emotional eating behaviors. We will monitor this closely and work on CBT to help improve the non-hunger eating patterns. Referral to Psychology may be required if no improvement is seen as she continues in our clinic.  Hyperlipidemia Freddye was informed of the American Heart Association Guidelines emphasizing intensive lifestyle modifications as the first line treatment for hyperlipidemia. We discussed many lifestyle modifications today in depth, and Chanler will continue to work on decreasing saturated fats such as fatty red meat, butter and many fried foods. Will monitor going forward. She will also increase vegetables and lean protein in her diet and continue to work on exercise and weight loss efforts.  Asthma Will monitor asthma at this time and Sanaiyah will follow-up as directed.  Gastroesophageal Reflux Disease (GERD) Will monitor GERD at this time and Aibhlinn will follow-up as directed.  Obesity Wilhelmine is currently in the action stage of change and her goal is to continue with weight loss efforts. She has agreed to follow the Category 3 plan. Natalin is currently walking 60-90 minutes 1 time per week (pre-COVID was at the gym much more often). We discussed the following Behavioral Modification Strategies  today: increasing lean protein intake, decreasing simple carbohydrates, increasing vegetables, increase  H20 intake, decrease liquid calories, work on meal planning and easy cooking plans, and celebration eating strategies.  Donn has agreed to follow-up with our clinic in 2 weeks. She was informed of the importance of frequent follow-up visits to maximize her success with intensive  lifestyle modifications for her multiple health conditions. She was informed we would discuss her lab results at her next visit unless there is a critical issue that needs to be addressed sooner. Maraya agreed to keep her next visit at the agreed upon time to discuss these results.  ALLERGIES: Allergies  Allergen Reactions  . Codeine Rash  . Hydrocodone Rash  . Sunscreens Itching and Rash    MEDICATIONS: Current Outpatient Medications on File Prior to Visit  Medication Sig Dispense Refill  . albuterol (PROAIR HFA) 108 (90 Base) MCG/ACT inhaler Inhale 2 puffs into the lungs every 6 (six) hours as needed for wheezing or shortness of breath.    . busPIRone (BUSPAR) 10 MG tablet Take 10 mg by mouth 3 (three) times daily.    . cetirizine (ZYRTEC) 10 MG tablet Take 10 mg by mouth daily.    . diphenhydrAMINE HCl, Sleep, (ZZZQUIL PO) Take by mouth.    . drospirenone-ethinyl estradiol (YAZ,GIANVI,LORYNA) 3-0.02 MG tablet Take 1 tablet by mouth daily.    . fluticasone (FLONASE) 50 MCG/ACT nasal spray Place 1 spray into both nostrils daily.    Marland Kitchen ibuprofen (ADVIL) 800 MG tablet Take 800 mg by mouth every 8 (eight) hours as needed.     No current facility-administered medications on file prior to visit.     PAST MEDICAL HISTORY: Past Medical History:  Diagnosis Date  . Allergies   . Anxiety   . Asthma   . Back pain   . Heartburn   . History of stomach ulcers   . Hyperlipidemia   . Joint pain     PAST SURGICAL HISTORY: Past Surgical History:  Procedure Laterality Date  . WISDOM TOOTH EXTRACTION      SOCIAL HISTORY: Social History   Tobacco Use  . Smoking status: Never Smoker  . Smokeless tobacco: Never Used  Substance Use Topics  . Alcohol use: No    Frequency: Never  . Drug use: No    FAMILY HISTORY: Family History  Problem Relation Age of Onset  . Asthma Mother   . Obesity Mother   . Asthma Maternal Grandmother   . Hyperlipidemia Father   . Allergic rhinitis Neg Hx    . Angioedema Neg Hx   . Eczema Neg Hx   . Immunodeficiency Neg Hx   . Urticaria Neg Hx    ROS: Review of Systems  HENT: Positive for sinus pain.   Eyes:       Positive for wearing glasses or contacts at work when using computer. Positive for floaters.  Respiratory:       Positive for asthma.  Cardiovascular: Negative for chest pain, orthopnea and claudication.  Gastrointestinal:       Positive for GERD.  Musculoskeletal: Negative for myalgias.  Skin:       Positive for skin dryness.  Psychiatric/Behavioral: Positive for depression (emotional eating). Negative for suicidal ideas.       Negative for homicidal ideas. Positive for stress.   PHYSICAL EXAM: Blood pressure 122/83, pulse (!) 109, temperature 98 F (36.7 C), temperature source Oral, height  (1.727 m), weight 295 lb (133.8 kg), last menstrual period 08/20/2019, SpO2  97 %. Body mass index is 44.85 kg/m. Physical Exam Vitals signs reviewed.  Constitutional:      Appearance: Normal appearance. She is well-developed. She is obese.  HENT:     Head: Normocephalic and atraumatic.     Nose: Nose normal.  Eyes:     General: No scleral icterus. Neck:     Musculoskeletal: Normal range of motion.  Cardiovascular:     Rate and Rhythm: Normal rate and regular rhythm.  Pulmonary:     Effort: Pulmonary effort is normal. No respiratory distress.  Abdominal:     Palpations: Abdomen is soft.     Tenderness: There is no abdominal tenderness.  Musculoskeletal: Normal range of motion.     Comments: Range of motion normal in all four extremities.  Skin:    General: Skin is warm and dry.  Neurological:     Mental Status: She is alert and oriented to person, place, and time.     Coordination: Coordination normal.  Psychiatric:        Mood and Affect: Mood and affect normal.        Behavior: Behavior normal.   RECENT LABS AND TESTS: BMET No results found for: NA, K, CL, CO2, GLUCOSE, BUN, CREATININE, CALCIUM, GFRNONAA,  GFRAA No results found for: HGBA1C No results found for: INSULIN CBC No results found for: WBC, RBC, HGB, HCT, PLT, MCV, MCH, MCHC, RDW, LYMPHSABS, MONOABS, EOSABS, BASOSABS Iron/TIBC/Ferritin/ %Sat No results found for: IRON, TIBC, FERRITIN, IRONPCTSAT Lipid Panel  No results found for: CHOL, TRIG, HDL, CHOLHDL, VLDL, LDLCALC, LDLDIRECT Hepatic Function Panel  No results found for: PROT, ALBUMIN, AST, ALT, ALKPHOS, BILITOT, BILIDIR, IBILI No results found for: TSH  No results found for: Vitamin D, 25-Hydroxy  ECG shows sinus rhythm with a rate of 94 BPM. Low voltage in precordial leads. Nonspecific T-abnormality. Nondiagnostic for age. Abnormal.  INDIRECT CALORIMETER done today shows a VO2 of 281 and a REE of 1958. Her calculated basal metabolic rate is 7829 thus her basal metabolic rate is worse than expected.  OBESITY BEHAVIORAL INTERVENTION VISIT  Today's visit was #1   Starting weight: 295 lbs Starting date: 08/24/2019 Today's weight: 295 lbs  Today's date: 08/24/2019 Total lbs lost to date: 0    08/24/2019  Height  (1.727 m)  Weight 295 lb (133.8 kg)  BMI (Calculated) 44.86  BLOOD PRESSURE - SYSTOLIC 122  BLOOD PRESSURE - DIASTOLIC 83  Waist Measurement  51 inches   Body Fat % 51.4 %  Total Body Water (lbs) 107.4 lbs  RMR 1958   ASK: We discussed the diagnosis of obesity with Charnele E Lyda Kalata today and Aja agreed to give [place] permission to discuss obesity behavioral modification therapy today.  ASSESS: Laveyah has the diagnosis of obesity and her BMI today is 45.0. Delories is in the action stage of change.   ADVISE: Annaleise was educated on the multiple health risks of obesity as well as the benefit of weight loss to improve her health. She was advised of the need for long term treatment and the importance of lifestyle modifications to improve her current health and to decrease her risk of future health problems.  AGREE: Multiple dietary modification options  and treatment options were discussed and  Silas agreed to follow the recommendations documented in the above note.  ARRANGE: Mesha was educated on the importance of frequent visits to treat obesity as outlined per CMS and USPSTF guidelines and agreed to schedule her next follow up appointment today.  IMarianna Payment, am acting as Energy manager for [employer]. Earlene Plater, DO  I have reviewed the above documentation for accuracy and completeness, and I agree with the above. Helane Rima, DO

## 2019-08-26 LAB — ANEMIA PANEL
Ferritin: 35 ng/mL (ref 15–150)
Folate, Hemolysate: 414 ng/mL
Folate, RBC: 1119 ng/mL (ref >498)
Hematocrit: 37 % (ref 34.0–46.6)
Iron Saturation: 18 % (ref 15–55)
Iron: 66 ug/dL (ref 27–159)
Retic Ct Pct: 1.7 % (ref 0.6–2.6)
Total Iron Binding Capacity: 360 ug/dL (ref 250–450)
UIBC: 294 ug/dL (ref 131–425)
Vitamin B-12: 284 pg/mL (ref 232–1245)

## 2019-08-26 LAB — VITAMIN D 25 HYDROXY (VIT D DEFICIENCY, FRACTURES): Vit D, 25-Hydroxy: 25.3 ng/mL — ABNORMAL LOW (ref 30.0–100.0)

## 2019-08-26 LAB — HEMOGLOBIN A1C
Est. average glucose Bld gHb Est-mCnc: 94 mg/dL
Hgb A1c MFr Bld: 4.9 % (ref 4.8–5.6)

## 2019-08-26 LAB — INSULIN, RANDOM: INSULIN: 27.6 u[IU]/mL — ABNORMAL HIGH (ref 2.6–24.9)

## 2019-09-05 ENCOUNTER — Other Ambulatory Visit: Payer: Self-pay

## 2019-09-05 ENCOUNTER — Ambulatory Visit (INDEPENDENT_AMBULATORY_CARE_PROVIDER_SITE_OTHER): Payer: BC Managed Care – PPO | Admitting: Family Medicine

## 2019-09-05 ENCOUNTER — Encounter (INDEPENDENT_AMBULATORY_CARE_PROVIDER_SITE_OTHER): Payer: Self-pay | Admitting: Family Medicine

## 2019-09-05 VITALS — BP 131/79 | HR 93 | Temp 98.3°F | Ht 68.0 in | Wt 293.0 lb

## 2019-09-05 DIAGNOSIS — E559 Vitamin D deficiency, unspecified: Secondary | ICD-10-CM

## 2019-09-05 DIAGNOSIS — G4709 Other insomnia: Secondary | ICD-10-CM

## 2019-09-05 DIAGNOSIS — E538 Deficiency of other specified B group vitamins: Secondary | ICD-10-CM

## 2019-09-05 DIAGNOSIS — Z6841 Body Mass Index (BMI) 40.0 and over, adult: Secondary | ICD-10-CM

## 2019-09-05 DIAGNOSIS — E8881 Metabolic syndrome: Secondary | ICD-10-CM | POA: Diagnosis not present

## 2019-09-05 DIAGNOSIS — Z9189 Other specified personal risk factors, not elsewhere classified: Secondary | ICD-10-CM | POA: Diagnosis not present

## 2019-09-05 MED ORDER — VITAMIN D (ERGOCALCIFEROL) 1.25 MG (50000 UNIT) PO CAPS: 50000.0000 [IU] | ORAL_CAPSULE | ORAL | 0 refills | Status: DC

## 2019-09-05 MED ORDER — TRAZODONE HCL 50 MG PO TABS: 25.0000 mg | ORAL_TABLET | Freq: Every evening | ORAL | 0 refills | Status: DC | PRN

## 2019-09-06 ENCOUNTER — Encounter (INDEPENDENT_AMBULATORY_CARE_PROVIDER_SITE_OTHER): Payer: Self-pay | Admitting: Family Medicine

## 2019-09-06 NOTE — Progress Notes (Signed)
Office: [phone]  /  Fax: [phone]   HPI:   Chief Complaint: OBESITY Ashley Acevedo is here to discuss her progress with her obesity treatment plan. She is on the Category 3 plan and is following her eating plan approximately 50 % of the time. 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 Ashley Acevedo is moving into a new (older) home. She does not have a refrigerator yet. She has not been able to follow the diet well because of this. When she did follow breakfast and lunch, she was not hungry. Her weight is 293 lb (132.9 kg) today and has had a weight loss of 2 pounds over a period of 1 to 2 weeks since her last visit. She has lost 2 lbs since starting treatment with Ashley Acevedo.  Vitamin D Deficiency Ashley Acevedo has a diagnosis of vitamin D deficiency. She is not currently taking Vit D and denies nausea, vomiting or muscle weakness.  At risk for osteopenia and osteoporosis Ashley Acevedo is at higher risk of osteopenia and osteoporosis due to vitamin D deficiency.   Insulin Resistance Ashley Acevedo has a diagnosis of insulin resistance based on her elevated fasting insulin level >5. Last insulin level was 27.6 on 08/24/2019. Although Ashley Acevedo blood glucose readings are still under good control, insulin resistance puts her at greater risk of metabolic syndrome and diabetes. She is not taking metformin currently and would prefer to avoid medications. She continues to work on diet and exercise to decrease risk of diabetes.  Vitamin B12 Deficiency Ashley Acevedo has a diagnosis of B12 insufficiency. Last Vit B12 level was 284 on 08/24/2019, the goal level is >400. She is not a vegetarian and does not have a previous diagnosis of pernicious anemia. She does not have a history of weight loss surgery.   Insomnia Ashley Acevedo complains of insomnia. She states she only sleeps 6 to 8 hours per night on average. She is taking benadryl 25 mg PO qhs.  ASSESSMENT AND PLAN:  Vitamin D deficiency - Plan: Vitamin D,  Ergocalciferol, (DRISDOL) 1.25 MG (50000 UT) CAPS capsule  Insulin resistance  B12 nutritional deficiency  Other insomnia - Plan: traZODone (DESYREL) 50 MG tablet  At risk for osteoporosis  Class 3 severe obesity with serious comorbidity and body mass index (BMI) of 40.0 to 44.9 in adult, unspecified obesity type (HCC)  PLAN:  Vitamin D Deficiency Ashley Acevedo was informed that low vitamin D levels contributes to fatigue and are associated with obesity, breast, and colon cancer. Ashley Acevedo agrees to start prescription Vit D 50,000 IU every week #4 with no refills. She will follow up for routine testing of vitamin D, at least 2-3 times per year. She was informed of the risk of over-replacement of vitamin D and agrees to not increase her dose unless she discusses this with Ashley Acevedo first. Ashley Acevedo agrees to follow up with our clinic in 2 weeks.  At risk for osteopenia and osteoporosis Ashley Acevedo was given extended (15 minutes) osteoporosis prevention counseling today. Ashley Acevedo is at risk for osteopenia and osteoporsis due to her vitamin D deficiency. She was encouraged to take her vitamin D and follow her higher calcium diet and increase strengthening exercise to help strengthen her bones and decrease her risk of osteopenia and osteoporosis.  Insulin Resistance Ashley Acevedo will continue to work on weight loss, exercise, and decreasing simple carbohydrates in her diet to help decrease the risk of diabetes. We dicussed metformin including benefits and risks. She was informed that eating too many simple carbohydrates  or too many calories at one sitting increases the likelihood of GI side effects. Ashley Acevedo agrees to follow up with [place] as directed to monitor her progress.  Vitamin B12 Deficiency Ashley Acevedo will work on increasing B12 rich foods in her diet. We discussed diets high in B12 Vitamins. Ashley Acevedo is to take [employer] life-B complex vitamins. Ashley Acevedo agrees to follow up with our clinic in 2 weeks.  Insomnia The problem of  recurrent insomnia was discussed. Avoidance of caffeine sources was strongly encouraged and sleep hygiene issues were reviewed. Ashley Acevedo agrees to start Trazodone 50 mg 0.5-1 tablet PO qhs as needed for sleep #30 with no refills. Ashley Acevedo agrees to follow up with our clinic in 2 weeks.  Obesity Ashley Acevedo is currently in the action stage of change. As such, her goal is to continue with weight loss efforts She has agreed to follow the Category 3 plan Ashley Acevedo has been instructed to work up to a goal of 150 minutes of combined cardio and strengthening exercise per week or as tolerated for weight loss and overall health benefits. We discussed the following Behavioral Modification Strategies today: increasing lean protein intake, decreasing simple carbohydrates, increasing vegetables, increase H20 intake, work on meal planning and easy cooking plans, holiday eating strategies  and decrease liquid calories   Ashley Acevedo has agreed to follow up with our clinic in 2 weeks. She was informed of the importance of frequent follow up visits to maximize her success with intensive lifestyle modifications for her multiple health conditions.  ALLERGIES: Allergies  Allergen Reactions  . Codeine Rash  . Hydrocodone Rash  . Sunscreens Itching and Rash    MEDICATIONS: Current Outpatient Medications on File Prior to Visit  Medication Sig Dispense Refill  . albuterol (PROAIR HFA) 108 (90 Base) MCG/ACT inhaler Inhale 2 puffs into the lungs every 6 (six) hours as needed for wheezing or shortness of breath.    . busPIRone (BUSPAR) 10 MG tablet Take 10 mg by mouth 3 (three) times daily.    . cetirizine (ZYRTEC) 10 MG tablet Take 10 mg by mouth daily.    . diphenhydrAMINE HCl, Sleep, (ZZZQUIL PO) Take by mouth.    . drospirenone-ethinyl estradiol (YAZ,GIANVI,LORYNA) 3-0.02 MG tablet Take 1 tablet by mouth daily.    . fluticasone (FLONASE) 50 MCG/ACT nasal spray Place 1 spray into both nostrils daily.    Marland Kitchen ibuprofen (ADVIL) 800 MG  tablet Take 800 mg by mouth every 8 (eight) hours as needed.     No current facility-administered medications on file prior to visit.     PAST MEDICAL HISTORY: Past Medical History:  Diagnosis Date  . Allergies   . Anxiety   . Asthma   . Back pain   . Heartburn   . History of stomach ulcers   . Hyperlipidemia   . Joint pain     PAST SURGICAL HISTORY: Past Surgical History:  Procedure Laterality Date  . WISDOM TOOTH EXTRACTION      SOCIAL HISTORY: Social History   Tobacco Use  . Smoking status: Never Smoker  . Smokeless tobacco: Never Used  Substance Use Topics  . Alcohol use: No    Frequency: Never  . Drug use: No    FAMILY HISTORY: Family History  Problem Relation Age of Onset  . Asthma Mother   . Obesity Mother   . Asthma Maternal Grandmother   . Hyperlipidemia Father   . Allergic rhinitis Neg Hx   . Angioedema Neg Hx   . Eczema Neg Hx   .  Immunodeficiency Neg Hx   . Urticaria Neg Hx     ROS: Review of Systems  Constitutional: Positive for weight loss.  Gastrointestinal: Negative for nausea and vomiting.  Musculoskeletal:       Negative muscle weakness    PHYSICAL EXAM: Blood pressure 131/79, pulse 93, temperature 98.3 F (36.8 C), temperature source Oral, height 5\' 8"  (1.727 m), weight 293 lb (132.9 kg), last menstrual period 08/20/2019, SpO2 94 %. Body mass index is 44.55 kg/m. Physical Exam Vitals signs reviewed.  Constitutional:      Appearance: Normal appearance. She is obese.  Cardiovascular:     Rate and Rhythm: Normal rate.     Pulses: Normal pulses.  Pulmonary:     Effort: Pulmonary effort is normal.     Breath sounds: Normal breath sounds.  Musculoskeletal: Normal range of motion.  Skin:    General: Skin is warm and dry.  Neurological:     Mental Status: She is alert and oriented to person, place, and time.  Psychiatric:        Mood and Affect: Mood normal.        Behavior: Behavior normal.     RECENT LABS AND  TESTS: BMET No results found for: NA, K, CL, CO2, GLUCOSE, BUN, CREATININE, CALCIUM, GFRNONAA, GFRAA Lab Results  Component Value Date   HGBA1C 4.9 08/24/2019   Lab Results  Component Value Date   INSULIN 27.6 (H) 08/24/2019   CBC    Component Value Date/Time   HCT 37.0 08/24/2019 0915   Iron/TIBC/Ferritin/ %Sat    Component Value Date/Time   IRON 66 08/24/2019 0915   TIBC 360 08/24/2019 0915   FERRITIN 35 08/24/2019 0915   IRONPCTSAT 18 08/24/2019 0915   Lipid Panel  No results found for: CHOL, TRIG, HDL, CHOLHDL, VLDL, LDLCALC, LDLDIRECT Hepatic Function Panel  No results found for: PROT, ALBUMIN, AST, ALT, ALKPHOS, BILITOT, BILIDIR, IBILI No results found for: TSH    OBESITY BEHAVIORAL INTERVENTION VISIT  Today's visit was # 2   Starting weight: 295 lbs Starting date: 08/24/2019 Today's weight : 293 lbs Today's date: 09/05/2019 Total lbs lost to date: 2    ASK: We discussed the diagnosis of obesity with 09/07/2019 today and Karisha agreed to give [name] permission to discuss obesity behavioral modification therapy today.  ASSESS: Marranda has the diagnosis of obesity and her BMI today is 44.56 Malashia is in the action stage of change   ADVISE: Tagen was educated on the multiple health risks of obesity as well as the benefit of weight loss to improve her health. She was advised of the need for long term treatment and the importance of lifestyle modifications to improve her current health and to decrease her risk of future health problems.  AGREE: Multiple dietary modification options and treatment options were discussed and  Cletus agreed to follow the recommendations documented in the above note.  ARRANGE: Quantia was educated on the importance of frequent visits to treat obesity as outlined per CMS and USPSTF guidelines and agreed to schedule her next follow up appointment today.  [place], am acting as transcriptionist for Trude Mcburney, DO  I have  reviewed the above documentation for accuracy and completeness, and I agree with the above. Helane Rima, DO

## 2019-09-22 ENCOUNTER — Other Ambulatory Visit: Payer: Self-pay

## 2019-09-22 ENCOUNTER — Encounter (INDEPENDENT_AMBULATORY_CARE_PROVIDER_SITE_OTHER): Payer: Self-pay | Admitting: Family Medicine

## 2019-09-22 ENCOUNTER — Ambulatory Visit (INDEPENDENT_AMBULATORY_CARE_PROVIDER_SITE_OTHER): Payer: BC Managed Care – PPO | Admitting: Family Medicine

## 2019-09-22 VITALS — BP 122/67 | HR 97 | Temp 97.7°F | Ht 68.0 in | Wt 294.0 lb

## 2019-09-22 DIAGNOSIS — E8881 Metabolic syndrome: Secondary | ICD-10-CM

## 2019-09-22 DIAGNOSIS — G4709 Other insomnia: Secondary | ICD-10-CM

## 2019-09-22 DIAGNOSIS — Z6841 Body Mass Index (BMI) 40.0 and over, adult: Secondary | ICD-10-CM

## 2019-09-22 DIAGNOSIS — Z9189 Other specified personal risk factors, not elsewhere classified: Secondary | ICD-10-CM

## 2019-09-22 DIAGNOSIS — E538 Deficiency of other specified B group vitamins: Secondary | ICD-10-CM

## 2019-09-22 DIAGNOSIS — E559 Vitamin D deficiency, unspecified: Secondary | ICD-10-CM | POA: Diagnosis not present

## 2019-09-22 DIAGNOSIS — F419 Anxiety disorder, unspecified: Secondary | ICD-10-CM

## 2019-09-22 MED ORDER — VITAMIN D (ERGOCALCIFEROL) 1.25 MG (50000 UNIT) PO CAPS: 50000.0000 [IU] | ORAL_CAPSULE | ORAL | 0 refills | Status: DC

## 2019-09-26 ENCOUNTER — Encounter (INDEPENDENT_AMBULATORY_CARE_PROVIDER_SITE_OTHER): Payer: Self-pay | Admitting: Family Medicine

## 2019-09-26 NOTE — Telephone Encounter (Signed)
Please advise 

## 2019-09-26 NOTE — Progress Notes (Signed)
Office: [phone]  /  Fax: [phone]   HPI:  Chief Complaint: OBESITY Ashley Acevedo is here to discuss her progress with her obesity treatment plan. She is on the Category 3 plan and states she is following her eating plan approximately 60 % of the time. She states she is working on the house for 3 hours 7 times per week, and is walking 2-3 miles 1 time per week.  [name] finally got a refrigerator last week. She was only able to adhere to the plan well since then. She is still sleeping on the floor, but she is expecting a new king sized bed today. She is walking more and [place] so walking to see the Christmas ball lights.  Today's visit was # 3  Starting weight: 295 lbs Starting date: 08/24/2019 Today's weight : 294 lbs Today's date: 09/22/2019 Total lbs lost to date: 1 Total lbs lost since last in-office visit: 0  Vitamin D Deficiency Ashley Acevedo has a diagnosis of vitamin D deficiency. She is taking prescription Vit D supplement, and is tolerating it well.   Insulin Resistance Ashley Acevedo has a diagnosis of insulin resistance based on her last insulin level of 27.6 on 08/24/2019. She prefers to avoid medications.  At risk for diabetes Ashley Acevedo is at higher than average risk for developing diabetes due to her obesity and insulin resistance.   Insomnia Ashley Acevedo complains of insomnia. She was prescribed Trazadone at her last visit. She is taking 50 mg dose at night. She denies morning sedation. She is still waking up sometimes.  Vitamin B12 Deficiency Ashley Acevedo has a diagnosis of B12 deficiency. Last Vit B12 level was 284 on 08/24/2019, goal is >400. She is now on a high B12 diet and is taking B complex q AM.  Anxiety Ashley Acevedo is taking Buspar 10 mg PO TID as needed. She denies any concerns.  ASSESSMENT AND PLAN:  Vitamin D deficiency - Plan: Vitamin D, Ergocalciferol, (DRISDOL) 1.25 MG (50000 UT) CAPS capsule  Insulin resistance  B12 nutritional deficiency  Other  insomnia  Anxiety  At risk for diabetes mellitus  Class 3 severe obesity with serious comorbidity and body mass index (BMI) of 40.0 to 44.9 in adult, unspecified obesity type (HCC)  PLAN:  Vitamin D Deficiency Low vitamin D level contributes to fatigue and are associated with obesity, breast, and colon cancer. [name] agrees to continue taking prescription Vit D 50,000 IU every week #4 and we will refill for 1 month. She will follow up for routine testing of vitamin D, at least 2-3 times per year to avoid over-replacement. [name] agrees to follow up with our clinic in 2 weeks.  Insulin Resistance [name] will continue to work on weight loss, increasing exercise, and decreasing simple carbohydrates to help decrease the risk of diabetes. [name] agreed to follow up with us as directed to closely monitor her progress. We will recheck labs in 3 months.  Diabetes risk counseling (~15 min) Ashley Acevedo is a 23 y.o. female and has risk factors for diabetes including obesity and insulin resistance. We discussed intensive lifestyle modifications today with an emphasis on weight loss as well as increasing exercise and decreasing simple carbohydrates in her diet.  Insomnia The problem of recurrent insomnia was discussed. Sleep hygiene issues were reviewed. Ashley Acevedo will see if her new bed will improve her sleep, if not it is ok to increase Trazadone to 1.5 tablet qhs. Ashley Acevedo agrees to follow up with our clinic in 2 weeks.  Vitamin B12 Deficiency Ashley Acevedo will  work on increasing B12 rich foods in her diet. Ashley Acevedo will continue with her B12 suppelment and will follow up.  Anxiety We will continue to monitor her progress.  Obesity Ashley Acevedo is currently in the action stage of change. As such, her goal is to continue with weight loss efforts. She has agreed to follow the Category 3 plan.  Ashley Acevedo has been instructed to work up to a goal of 150 minutes of combined cardio and strengthening exercise per week for weight loss and  overall health benefits.   We discussed the following Behavioral Modification Strategies today: work on meal planning and easy cooking plans, keeping healthy foods in the home, and planning for success  Ashley Acevedo has agreed to follow up with our clinic in 2 weeks. She was informed of the importance of frequent follow up visits to maximize her success with intensive lifestyle modifications for her multiple health conditions.  ALLERGIES: Allergies  Allergen Reactions  . Codeine Rash  . Hydrocodone Rash  . Sunscreens Itching and Rash   MEDICATIONS: Current Outpatient Medications on File Prior to Visit  Medication Sig Dispense Refill  . albuterol (PROAIR HFA) 108 (90 Base) MCG/ACT inhaler Inhale 2 puffs into the lungs every 6 (six) hours as needed for wheezing or shortness of breath.    . busPIRone (BUSPAR) 10 MG tablet Take 10 mg by mouth 3 (three) times daily.    . cetirizine (ZYRTEC) 10 MG tablet Take 10 mg by mouth daily.    . diphenhydrAMINE HCl, Sleep, (ZZZQUIL PO) Take by mouth.    . drospirenone-ethinyl estradiol (YAZ,GIANVI,LORYNA) 3-0.02 MG tablet Take 1 tablet by mouth daily.    . fluticasone (FLONASE) 50 MCG/ACT nasal spray Place 1 spray into both nostrils daily.    Marland Kitchen ibuprofen (ADVIL) 800 MG tablet Take 800 mg by mouth every 8 (eight) hours as needed.    . traZODone (DESYREL) 50 MG tablet Take 0.5-1 tablets (25-50 mg total) by mouth at bedtime as needed for sleep. 30 tablet 0   No current facility-administered medications on file prior to visit.   PAST MEDICAL HISTORY: Past Medical History:  Diagnosis Date  . Allergies   . Anxiety   . Asthma   . Back pain   . Heartburn   . History of stomach ulcers   . Hyperlipidemia   . Joint pain    PAST SURGICAL HISTORY: Past Surgical History:  Procedure Laterality Date  . WISDOM TOOTH EXTRACTION     SOCIAL HISTORY: Social History   Tobacco Use  . Smoking status: Never Smoker  . Smokeless tobacco: Never Used  Substance Use  Topics  . Alcohol use: No  . Drug use: No   FAMILY HISTORY: Family History  Problem Relation Age of Onset  . Asthma Mother   . Obesity Mother   . Asthma Maternal Grandmother   . Hyperlipidemia Father   . Allergic rhinitis Neg Hx   . Angioedema Neg Hx   . Eczema Neg Hx   . Immunodeficiency Neg Hx   . Urticaria Neg Hx    ROS: Review of Systems  Constitutional: Negative for weight loss.  Psychiatric/Behavioral: The patient has insomnia.        + Anxiety   PHYSICAL EXAM: Blood pressure 122/67, pulse 97, temperature 97.7 F (36.5 C), temperature source Oral, height 5\' 8"  (1.727 m), weight 294 lb (133.4 kg), last menstrual period 09/15/2019, SpO2 96 %. Body mass index is 44.7 kg/m.   Physical Exam Vitals reviewed.  Constitutional:  Appearance: Normal appearance. She is obese.  Cardiovascular:     Rate and Rhythm: Normal rate.     Pulses: Normal pulses.  Pulmonary:     Effort: Pulmonary effort is normal.     Breath sounds: Normal breath sounds.  Musculoskeletal:        General: Normal range of motion.  Skin:    General: Skin is warm and dry.  Neurological:     Mental Status: She is alert and oriented to person, place, and time.  Psychiatric:        Mood and Affect: Mood normal.        Behavior: Behavior normal.    RECENT LABS AND TESTS: BMET No results found for: NA, K, CL, CO2, GLUCOSE, BUN, CREATININE, CALCIUM, GFRNONAA, GFRAA Lab Results  Component Value Date   HGBA1C 4.9 08/24/2019   Lab Results  Component Value Date   INSULIN 27.6 (H) 08/24/2019   CBC    Component Value Date/Time   HCT 37.0 08/24/2019 0915   Iron/TIBC/Ferritin/ %Sat    Component Value Date/Time   IRON 66 08/24/2019 0915   TIBC 360 08/24/2019 0915   FERRITIN 35 08/24/2019 0915   IRONPCTSAT 18 08/24/2019 0915   I, Burt Knack, am acting as transcriptionist for Helane Rima, DO  I have reviewed the above documentation for accuracy and completeness, and I agree with the  above. Helane Rima, DO

## 2019-09-27 DIAGNOSIS — K219 Gastro-esophageal reflux disease without esophagitis: Secondary | ICD-10-CM | POA: Diagnosis not present

## 2019-09-27 DIAGNOSIS — Z6841 Body Mass Index (BMI) 40.0 and over, adult: Secondary | ICD-10-CM | POA: Diagnosis not present

## 2019-09-27 DIAGNOSIS — R112 Nausea with vomiting, unspecified: Secondary | ICD-10-CM | POA: Diagnosis not present

## 2019-10-03 ENCOUNTER — Other Ambulatory Visit (INDEPENDENT_AMBULATORY_CARE_PROVIDER_SITE_OTHER): Payer: Self-pay | Admitting: Family Medicine

## 2019-10-03 DIAGNOSIS — G4709 Other insomnia: Secondary | ICD-10-CM

## 2019-10-04 ENCOUNTER — Other Ambulatory Visit: Payer: Self-pay

## 2019-10-04 ENCOUNTER — Ambulatory Visit (INDEPENDENT_AMBULATORY_CARE_PROVIDER_SITE_OTHER): Payer: BC Managed Care – PPO | Admitting: Family Medicine

## 2019-10-04 ENCOUNTER — Encounter (INDEPENDENT_AMBULATORY_CARE_PROVIDER_SITE_OTHER): Payer: Self-pay | Admitting: Family Medicine

## 2019-10-04 VITALS — BP 125/76 | HR 87 | Temp 98.3°F | Ht 68.0 in | Wt 289.0 lb

## 2019-10-04 DIAGNOSIS — E538 Deficiency of other specified B group vitamins: Secondary | ICD-10-CM | POA: Diagnosis not present

## 2019-10-04 DIAGNOSIS — Z9189 Other specified personal risk factors, not elsewhere classified: Secondary | ICD-10-CM | POA: Diagnosis not present

## 2019-10-04 DIAGNOSIS — E559 Vitamin D deficiency, unspecified: Secondary | ICD-10-CM | POA: Diagnosis not present

## 2019-10-04 DIAGNOSIS — G4709 Other insomnia: Secondary | ICD-10-CM

## 2019-10-04 DIAGNOSIS — Z6841 Body Mass Index (BMI) 40.0 and over, adult: Secondary | ICD-10-CM

## 2019-10-04 DIAGNOSIS — F419 Anxiety disorder, unspecified: Secondary | ICD-10-CM

## 2019-10-04 MED ORDER — VITAMIN D (ERGOCALCIFEROL) 1.25 MG (50000 UNIT) PO CAPS: 50000.0000 [IU] | ORAL_CAPSULE | ORAL | 0 refills | Status: DC

## 2019-10-08 NOTE — Telephone Encounter (Signed)
Please advise 

## 2019-10-11 ENCOUNTER — Encounter (INDEPENDENT_AMBULATORY_CARE_PROVIDER_SITE_OTHER): Payer: Self-pay | Admitting: Family Medicine

## 2019-10-11 NOTE — Progress Notes (Signed)
Chief Complaint: OBESITY Ashley Acevedo is here to discuss her progress with her obesity treatment plan. Ashley Acevedo is on the Category 3 plan and states she is following her eating plan approximately 80 % of the time. Ashley Acevedo states she is walking for 30-45 minutes 2 times per week.  Today's visit was # 4  Starting weight: 295 lbs Starting date: 08/24/2019 Today's weight : 289 lbs Today's date: 10/04/2019 Total lbs lost to date: 6 Total lbs lost since last in-office visit: 5  Subjective:   Interim History: Ashley Acevedo is doing much better now that she has a bed and fridge. She had a few episodes of epigastric pain and nausea. Once after eating fries with ketchup and then after eating pizza. She saw her primary care physician about her concerns and was diagnoses with possible gastritis. She was given a PPI to take if it did not improve. She has not taken the medication yet.  1. Vitamin D deficiency Ashley Acevedo is taking prescription Vit D. Last Vit D level was 27.6.  2. B12 nutritional deficiency Ashley Acevedo is taking B complex supplementation. Last Vit B12 level was 284.  3. Anxiety Ashley Acevedo is taking Buspar as needed and rarely.  4. Other insomnia Ashley Acevedo is taking Trazodone as needed to help her sleep.  5. At risk for osteopenia and osteoporosis Ashley Acevedo is at higher risk of osteopenia and osteoporosis due to Vitamin D deficiency.   Reviewed and updated this visit by clinician and staff: allergies, medications, problem list, medical history, surgical history, family history, social history and previous encounter notes.  General review of systems is unchanged or negative, with the exception of new weight loss and epigastric pain.  Assessment:   1. Vitamin D deficiency   2. B12 nutritional deficiency   3. Anxiety   4. Other insomnia   5. At risk for osteoporosis   6. Class 3 severe obesity with serious comorbidity and body mass index (BMI) of 40.0 to 44.9 in adult, unspecified  obesity type ([place])    Plan:   1. Vitamin D deficiency Low Vitamin D level contributes to fatigue and are associated with obesity, breast, and colon cancer. Ashley Acevedo agrees to continue taking prescription Vitamin D 50,000 IU every week #4 and we will refill for 1 month. She will follow up for routine testing of vitamin D, at least 2-3 times per year to avoid over-replacement. Davyn agrees to follow up with [place] as directed to monitor her progress.  2. B12 nutritional deficiency Ashley Acevedo will work on increasing B12 rich foods in her diet. B12 supplementation was not prescribed today. We will continue to monitor.  3. Anxiety We will continue to monitor her progress.  4. Other insomnia The problem of recurrent insomnia was discussed. Avoidance of caffeine was strongly encouraged and sleep hygiene issues were reviewed. We will continue to monitor her progress.  5. At risk for osteopenia and osteoporosis Ashley Acevedo was given extended (15 minutes) osteoporosis prevention counseling today. Ashley Acevedo is at risk for osteopenia and osteoporosis due to her Vitamin D deficiency. She was encouraged to take her Vitamin D and follow her higher calcium diet and increase strengthening exercise to help strengthen her bones and decrease her risk of osteopenia and osteoporosis.  6. Class 3 severe obesity with serious comorbidity and body mass index (BMI) of 40.0 to 44.9 in adult, unspecified obesity type [facility]) Ashley Acevedo is currently in the action stage of change. As such, her goal is to continue  with weight loss efforts. She has agreed to follow the Category 3 plan. The exercise goal is to eventually work up to 150 minutes of combined cardio and strengthening exercise per week for weight loss and overall health benefits. We discussed the following Behavioral Modification Strategies today: increasing lean protein intake, decreasing simple carbohydrates , increasing vegetables and increasing water intake.  Ashley Acevedo  has agreed to follow-up with our clinic in 2 to 3 weeks. She was informed of the importance of frequent follow-up visits to maximize her success with intensive lifestyle modifications for her multiple health conditions.  Objective:   Blood pressure 125/76, pulse 87, temperature 98.3 F (36.8 C), temperature source Oral, height 5\' 8"  (1.727 m), weight 289 lb (131.1 kg), last menstrual period 09/15/2019, SpO2 97 %. Body mass index is 43.94 kg/m.  General: Cooperative, alert, well developed, in no acute distress. HEENT: Conjunctivae and lids unremarkable. Neck: No thyromegaly.  Cardiovascular: Regular rhythm.  Lungs: Normal work of breathing. Extremities: No edema.  Neurologic: No focal deficits.      Component Value Date/Time   HCT 37.0 08/24/2019 0915   Lab Results  Component Value Date   HGBA1C 4.9 08/24/2019   Lab Results  Component Value Date   INSULIN 27.6 (H) 08/24/2019   Lab Results  Component Value Date   HGBA1C 4.9 08/24/2019   Lab Results  Component Value Date   IRON 66 08/24/2019   TIBC 360 08/24/2019   FERRITIN 35 08/24/2019   Attestation Statements:   13/08/2019, am acting as transcriptionist for Trude Mcburney, DO.  I have reviewed the above documentation for accuracy and completeness, and I agree with the above. Helane Rima, DO

## 2019-10-27 DIAGNOSIS — Z130 Encounter for screening for diseases of the blood and blood-forming organs and certain disorders involving the immune mechanism: Secondary | ICD-10-CM | POA: Diagnosis not present

## 2019-10-27 DIAGNOSIS — Z6841 Body Mass Index (BMI) 40.0 and over, adult: Secondary | ICD-10-CM | POA: Diagnosis not present

## 2019-10-27 DIAGNOSIS — Z01419 Encounter for gynecological examination (general) (routine) without abnormal findings: Secondary | ICD-10-CM | POA: Diagnosis not present

## 2019-10-27 DIAGNOSIS — Z1389 Encounter for screening for other disorder: Secondary | ICD-10-CM | POA: Diagnosis not present

## 2019-10-31 ENCOUNTER — Other Ambulatory Visit: Payer: Self-pay

## 2019-10-31 ENCOUNTER — Ambulatory Visit (INDEPENDENT_AMBULATORY_CARE_PROVIDER_SITE_OTHER): Payer: BC Managed Care – PPO | Admitting: Family Medicine

## 2019-10-31 ENCOUNTER — Encounter (INDEPENDENT_AMBULATORY_CARE_PROVIDER_SITE_OTHER): Payer: Self-pay | Admitting: Family Medicine

## 2019-10-31 VITALS — BP 124/74 | HR 95 | Temp 98.0°F | Ht 68.0 in | Wt 288.0 lb

## 2019-10-31 DIAGNOSIS — F419 Anxiety disorder, unspecified: Secondary | ICD-10-CM

## 2019-10-31 DIAGNOSIS — G4709 Other insomnia: Secondary | ICD-10-CM | POA: Diagnosis not present

## 2019-10-31 DIAGNOSIS — E559 Vitamin D deficiency, unspecified: Secondary | ICD-10-CM

## 2019-10-31 DIAGNOSIS — E538 Deficiency of other specified B group vitamins: Secondary | ICD-10-CM | POA: Diagnosis not present

## 2019-10-31 DIAGNOSIS — Z6841 Body Mass Index (BMI) 40.0 and over, adult: Secondary | ICD-10-CM

## 2019-10-31 DIAGNOSIS — E8881 Metabolic syndrome: Secondary | ICD-10-CM

## 2019-10-31 MED ORDER — VITAMIN D (ERGOCALCIFEROL) 1.25 MG (50000 UNIT) PO CAPS: 50000.0000 [IU] | ORAL_CAPSULE | ORAL | 0 refills | Status: DC

## 2019-10-31 NOTE — Progress Notes (Signed)
Chief Complaint:   OBESITY Ashley Acevedo is here to discuss her progress with her obesity treatment plan along with follow-up of her obesity related diagnoses. Ashley Acevedo is on the Category 3 Plan and states she is following her eating plan approximately 70% of the time. Ashley Acevedo states she is walking/high energy/dance for 30-45 minutes 3-4 times per week.  Today's visit was #: 5 Starting weight: 295 lbs Starting date: 08/24/2019 Today's weight: 288 lbs Today's date: 10/31/2019 Total lbs lost to date: 7 lbs Total lbs lost since last in-office visit: 1 lb  Interim History: Ashley Acevedo says she enjoyed the ice cream that her neighbor gave her over the holidays.  Her dyspepsia has improved after she took a PPI for 1 week.  She is now taking a probiotic.  Subjective:   1. Anxiety Stable at this time.     2. Vitamin D deficiency Ashley Acevedo's Vitamin D level was 25.3 on 08/24/2019. She is currently taking vit D. She denies nausea, vomiting or muscle weakness.  3. B12 nutritional deficiency She is not a vegetarian.  She does not have a previous diagnosis of pernicious anemia.  She does not have a history of weight loss surgery.   Lab Results  Component Value Date   VITAMINB12 284 08/24/2019   4. Other insomnia Ashley Acevedo is currently taking trazodone as needed for insomnia.  5. Insulin resistance Ashley Acevedo has a diagnosis of insulin resistance based on her elevated fasting insulin level >5. She continues to work on diet and exercise to decrease her risk of diabetes.  Lab Results  Component Value Date   INSULIN 27.6 (H) 08/24/2019   Lab Results  Component Value Date   HGBA1C 4.9 08/24/2019   Assessment/Plan:   1. Anxiety Behavior modification techniques were discussed today to help Ashley Acevedo deal with her anxiety.  Orders and follow up as documented in patient record.   2. Vitamin D deficiency Low Vitamin D level contributes to fatigue and are associated with obesity, breast, and colon cancer. She agrees to  continue to take prescription Vitamin D @50 ,000 IU every week and will follow-up for routine testing of Vitamin D, at least 2-3 times per year to avoid over-replacement.  - Vitamin D, Ergocalciferol, (DRISDOL) 1.25 MG (50000 UNIT) CAPS capsule; Take 1 capsule (50,000 Units total) by mouth every 7 (seven) days.  Dispense: 4 capsule; Refill: 0  3. B12 nutritional deficiency We will continue to monitor. Orders and follow up as documented in patient record.  Counseling . The body needs vitamin B12: to make red blood cells; to make DNA; and to help the nerves work properly so they can carry messages from the brain to the body.  . The main causes of vitamin B12 deficiency include dietary deficiency, digestive diseases, pernicious anemia, and having a surgery in which part of the stomach or small intestine is removed.  . Certain medicines can make it harder for the body to absorb vitamin B12. These medicines include: heartburn medications; some antibiotics; some medications used to treat diabetes, gout, and high cholesterol.  . In some cases, there are no symptoms of this condition. If the condition leads to anemia or nerve damage, various symptoms can occur, such as weakness or fatigue, shortness of breath, and numbness or tingling in your hands and feet.   . Treatment:  o May include taking vitamin B12 supplements.  o Avoid alcohol.  o Eat lots of healthy foods that contain vitamin B12: - Beef, pork, chicken, , and organ meats, such  as liver.  - Seafood: This includes clams, rainbow trout, salmon, tuna, and haddock. Eggs.  - Cereal and dairy products that are fortified: This means that vitamin B12 has been added to the food.  -  4. Other insomnia Orders and follow up as documented in patient record. We discussed several lifestyle modifications today and she will continue to work on diet, exercise and weight loss efforts.   Counseling  Limit or avoid alcohol, caffeinated beverages, and  cigarettes, especially close to bedtime.   Do not eat a large meal or eat spicy foods right before bedtime. This can lead to digestive discomfort that can make it hard for you to sleep.  Keep a sleep diary to help you and your health care provider figure out what could be causing your insomnia.  . Make your bedroom a dark, comfortable place where it is easy to fall asleep. ? Put up shades or blackout curtains to block light from outside. ? Use a white noise machine to block noise. ? Keep the temperature cool. . Limit screen use before bedtime. This includes: ? Watching TV. ? Using your smartphone, tablet, or computer. . Stick to a routine that includes going to bed and waking up at the same times every day and night. This can help you fall asleep faster. Consider making a quiet activity, such as reading, part of your nighttime routine. . Try to avoid taking naps during the day so that you sleep better at night. . Get out of bed if you are still awake after 15 minutes of trying to sleep. Keep the lights down, but try reading or doing a quiet activity. When you feel sleepy, go back to bed.  5. Insulin resistance Ashley Acevedo will continue to work on weight loss, exercise, and decreasing simple carbohydrates to help decrease the risk of diabetes. Ashley Acevedo agreed to follow-up with [place] as directed to closely monitor her progress.  6. Class 3 severe obesity with serious comorbidity and body mass index (BMI) of 40.0 to 44.9 in adult, unspecified obesity type Ashley Acevedo) Ashley Acevedo is currently in the action stage of change. As such, her goal is to continue with weight loss efforts. She has agreed to the Category 3 Plan.   Exercise goals: For substantial health benefits, adults should do at least 150 minutes (2 hours and 30 minutes) a week of moderate-intensity, or 75 minutes (1 hour and 15 minutes) a week of vigorous-intensity aerobic physical activity, or an equivalent combination of moderate- and vigorous-intensity  aerobic activity. Aerobic activity should be performed in episodes of at least 10 minutes, and preferably, it should be spread throughout the week. Adults should also include muscle-strengthening activities that involve all major muscle groups on 2 or more days a week.  She can also use the [employer] App.  Behavioral modification strategies: increasing lean protein intake, decreasing simple carbohydrates, increasing vegetables, increasing water intake and planning for success.  Ashley Acevedo has agreed to follow-up with our clinic in 2 weeks. She was informed of the importance of frequent follow-up visits to maximize her success with intensive lifestyle modifications for her multiple health conditions.   Objective:   Blood pressure 124/74, pulse 95, temperature 98 F (36.7 C), temperature source Oral, height 5\' 8"  (1.727 m), weight 288 lb (130.6 kg), last menstrual period 10/10/2019, SpO2 95 %. Body mass index is 43.79 kg/m.  General: Cooperative, alert, well developed, in no acute distress. HEENT: Conjunctivae and lids unremarkable. Cardiovascular: Regular rhythm.  Lungs: Normal work of breathing. Neurologic:  No focal deficits.   Lab Results  Component Value Date   HGBA1C 4.9 08/24/2019   Lab Results  Component Value Date   INSULIN 27.6 (H) 08/24/2019   Lab Results  Component Value Date   HCT 37.0 08/24/2019   Lab Results  Component Value Date   IRON 66 08/24/2019   TIBC 360 08/24/2019   FERRITIN 35 08/24/2019   Attestation Statements:   Reviewed by clinician on day of visit: allergies, medications, problem list, medical history, surgical history, family history, social history, and previous encounter notes.  I, Insurance claims handler, CMA, am acting as Energy manager for [employer], DO.  I have reviewed the above documentation for accuracy and completeness, and I agree with the above. Helane Rima, DO

## 2019-11-21 ENCOUNTER — Encounter (INDEPENDENT_AMBULATORY_CARE_PROVIDER_SITE_OTHER): Payer: Self-pay | Admitting: Family Medicine

## 2019-11-21 ENCOUNTER — Other Ambulatory Visit: Payer: Self-pay

## 2019-11-21 ENCOUNTER — Ambulatory Visit (INDEPENDENT_AMBULATORY_CARE_PROVIDER_SITE_OTHER): Payer: BC Managed Care – PPO | Admitting: Family Medicine

## 2019-11-21 VITALS — BP 117/73 | HR 104 | Temp 98.3°F | Ht 68.0 in | Wt 285.0 lb

## 2019-11-21 DIAGNOSIS — Z9189 Other specified personal risk factors, not elsewhere classified: Secondary | ICD-10-CM | POA: Diagnosis not present

## 2019-11-21 DIAGNOSIS — E8881 Metabolic syndrome: Secondary | ICD-10-CM | POA: Diagnosis not present

## 2019-11-21 DIAGNOSIS — E538 Deficiency of other specified B group vitamins: Secondary | ICD-10-CM

## 2019-11-21 DIAGNOSIS — G4709 Other insomnia: Secondary | ICD-10-CM

## 2019-11-21 DIAGNOSIS — E559 Vitamin D deficiency, unspecified: Secondary | ICD-10-CM

## 2019-11-21 DIAGNOSIS — F419 Anxiety disorder, unspecified: Secondary | ICD-10-CM

## 2019-11-21 DIAGNOSIS — Z6841 Body Mass Index (BMI) 40.0 and over, adult: Secondary | ICD-10-CM

## 2019-11-21 MED ORDER — VITAMIN D (ERGOCALCIFEROL) 1.25 MG (50000 UNIT) PO CAPS: 50000.0000 [IU] | ORAL_CAPSULE | ORAL | 0 refills | Status: DC

## 2019-11-21 MED ORDER — ESCITALOPRAM OXALATE 10 MG PO TABS: 10.0000 mg | ORAL_TABLET | Freq: Every day | ORAL | 0 refills | Status: DC

## 2019-11-21 NOTE — Progress Notes (Signed)
Chief Complaint:   OBESITY Ashley Acevedo is here to discuss her progress with her obesity treatment plan along with follow-up of her obesity related diagnoses. Ashley Acevedo is on the Category 3 Plan and states she is following her eating plan approximately 85% of the time. Ashley Acevedo states she is walking for 45 minutes 3 times per week.  Today's visit was #: 6 Starting weight: 295 lbs Starting date: 10/31/2019 Today's weight: 285 lbs Today's date: 11/21/2019 Total lbs lost to date: 10 lbs Total lbs lost since last in-office visit: 3 lbs  Interim History: Ashley Acevedo has done some celebratory eating.  She has a friend who got engaged and also celebrated during the [employer].  She has had increased anxiety lately due to lots of life changes and family illness.  Subjective:   1. Vitamin D deficiency Ashley Acevedo's Vitamin D level was 25.3 on 08/24/2019. She is currently taking vit D. She denies nausea, vomiting or muscle weakness.  2. B12 nutritional deficiency She is not a vegetarian.  She does not have a previous diagnosis of pernicious anemia.  She does not have a history of weight loss surgery.   Lab Results  Component Value Date   VITAMINB12 284 08/24/2019   3. Other insomnia Kla takes trazodone as needed at night to help her sleep.  4. Insulin resistance Ashley Acevedo has a diagnosis of insulin resistance based on her elevated fasting insulin level >5. She continues to work on diet and exercise to decrease her risk of diabetes.  Lab Results  Component Value Date   INSULIN 27.6 (H) 08/24/2019   Lab Results  Component Value Date   HGBA1C 4.9 08/24/2019   5. Anxiety Ashley Acevedo is taking Buspar 10 mg twice daily for anxiety.  6. At risk for diabetes mellitus Ashley Acevedo is at higher than average risk for developing diabetes due to her obesity.   Assessment/Plan:   1. Vitamin D deficiency Low Vitamin D level contributes to fatigue and are associated with obesity, breast, and colon cancer. She agrees to continue  to take prescription Vitamin D @50 ,000 IU every week and will follow-up for routine testing of Vitamin D, at least 2-3 times per year to avoid over-replacement.  Orders - Vitamin D, Ergocalciferol, (DRISDOL) 1.25 MG (50000 UNIT) CAPS capsule; Take 1 capsule (50,000 Units total) by mouth every 7 (seven) days.  Dispense: 4 capsule; Refill: 0  2. B12 nutritional deficiency We will continue to monitor. Orders and follow up as documented in patient record.  Counseling . The body needs vitamin B12: to make red blood cells; to make DNA; and to help the nerves work properly so they can carry messages from the brain to the body.  . The main causes of vitamin B12 deficiency include dietary deficiency, digestive diseases, pernicious anemia, and having a surgery in which part of the stomach or small intestine is removed.  . Certain medicines can make it harder for the body to absorb vitamin B12. These medicines include: heartburn medications; some antibiotics; some medications used to treat diabetes, gout, and high cholesterol.  . In some cases, there are no symptoms of this condition. If the condition leads to anemia or nerve damage, various symptoms can occur, such as weakness or fatigue, shortness of breath, and numbness or tingling in your hands and feet.   . Treatment:  o May include taking vitamin B12 supplements.  o Avoid alcohol.  o Eat lots of healthy foods that contain vitamin B12: - Beef, pork, chicken, , and organ  meats, such as liver.  - Seafood: This includes clams, rainbow trout, salmon, tuna, and haddock. Eggs.  - Cereal and dairy products that are fortified: This means that vitamin B12 has been added to the food.   3. Other insomnia The problem of recurrent insomnia was discussed. Orders and follow up as documented in patient record. Counseling: Intensive lifestyle modifications are the first line treatment for this issue. We discussed several lifestyle modifications today and she  will continue to work on diet, exercise and weight loss efforts.   Counseling  Limit or avoid alcohol, caffeinated beverages, and cigarettes, especially close to bedtime.   Do not eat a large meal or eat spicy foods right before bedtime. This can lead to digestive discomfort that can make it hard for you to sleep.  Keep a sleep diary to help you and your health care provider figure out what could be causing your insomnia.  . Make your bedroom a dark, comfortable place where it is easy to fall asleep. ? Put up shades or blackout curtains to block light from outside. ? Use a white noise machine to block noise. ? Keep the temperature cool. . Limit screen use before bedtime. This includes: ? Watching TV. ? Using your smartphone, tablet, or computer. . Stick to a routine that includes going to bed and waking up at the same times every day and night. This can help you fall asleep faster. Consider making a quiet activity, such as reading, part of your nighttime routine. . Try to avoid taking naps during the day so that you sleep better at night. . Get out of bed if you are still awake after 15 minutes of trying to sleep. Keep the lights down, but try reading or doing a quiet activity. When you feel sleepy, go back to bed.  4. Insulin resistance Ashley Acevedo will continue to work on weight loss, exercise, and decreasing simple carbohydrates to help decrease the risk of diabetes. Ashley Acevedo agreed to follow-up with [place] as directed to closely monitor her progress.  5. Anxiety Behavior modification techniques were discussed today to help Ashley Acevedo deal with her anxiety.  Orders and follow up as documented in patient record.   Orders - escitalopram (LEXAPRO) 10 MG tablet; Take 1 tablet (10 mg total) by mouth daily.  Dispense: 30 tablet; Refill: 0  6. At risk for diabetes mellitus Ashley Acevedo was given approximately 15 minutes of diabetes education and counseling today. We discussed intensive lifestyle modifications today  with an emphasis on weight loss as well as increasing exercise and decreasing simple carbohydrates in her diet. We also reviewed medication options with an emphasis on risk versus benefit of those discussed.   Repetitive spaced learning was employed today to elicit superior memory formation and behavioral change.  7. Class 3 severe obesity with serious comorbidity and body mass index (BMI) of 40.0 to 44.9 in adult, unspecified obesity type [facility]) Ashley Acevedo is currently in the action stage of change. As such, her goal is to continue with weight loss efforts. She has agreed to the Category 3 Plan.   Exercise goals: For substantial health benefits, adults should do at least 150 minutes (2 hours and 30 minutes) a week of moderate-intensity, or 75 minutes (1 hour and 15 minutes) a week of vigorous-intensity aerobic physical activity, or an equivalent combination of moderate- and vigorous-intensity aerobic activity. Aerobic activity should be performed in episodes of at least 10 minutes, and preferably, it should be spread throughout the week. Adults should also include muscle-strengthening  activities that involve all major muscle groups on 2 or more days a week.  Behavioral modification strategies: emotional eating strategies.  Ashley Acevedo has agreed to follow-up with our clinic in 2 weeks. She was informed of the importance of frequent follow-up visits to maximize her success with intensive lifestyle modifications for her multiple health conditions.   Objective:   Blood pressure 117/73, pulse (!) 104, temperature 98.3 F (36.8 C), temperature source Oral, height 5\' 8"  (1.727 m), weight 285 lb (129.3 kg), last menstrual period 11/10/2019, SpO2 97 %. Body mass index is 43.33 kg/m.  General: Cooperative, alert, well developed, in no acute distress. HEENT: Conjunctivae and lids unremarkable. Cardiovascular: Regular rhythm.  Lungs: Normal work of breathing. Neurologic: No focal deficits.   Lab Results    Component Value Date   HGBA1C 4.9 08/24/2019   Lab Results  Component Value Date   INSULIN 27.6 (H) 08/24/2019   No results found for: TSH No results found for: CHOL, HDL, LDLCALC, LDLDIRECT, TRIG, CHOLHDL Lab Results  Component Value Date   HCT 37.0 08/24/2019   Lab Results  Component Value Date   IRON 66 08/24/2019   TIBC 360 08/24/2019   FERRITIN 35 08/24/2019   Attestation Statements:   Reviewed by clinician on day of visit: allergies, medications, problem list, medical history, surgical history, family history, social history, and previous encounter notes.  I, Water quality scientist, CMA, am acting as Location manager for [employer], DO.  I have reviewed the above documentation for accuracy and completeness, and I agree with the above. Briscoe Deutscher, DO

## 2019-11-23 NOTE — Progress Notes (Signed)
Ashley Acevedo is a 24 y.o. female here to Establish care.  I acted as a Neurosurgeon for [employer], PA-C Corky Mull, LPN  History of Present Illness:   Chief Complaint  Patient presents with  . Establish Care  . Back Pain    Back pain Pt c/o mid back pain R-sided off and on x 2 months. No radiation, denies nausea or diarrhea. Takes Ibuprofen 400 mg with relief. She had this pain in the past. In 2018 presented to her prior PCP with right upper quadrant and right flank pain for 1 month.  She had an ultrasound at that time which showed fatty liver, but no gallstones.  She did have a CT scan that was negative.  She also had OB/GYN work-up to see if maybe she was having endometriosis or other etiology of pain.  Overall her symptoms went away.  However now that they have returned, she is concerned because she still does not with causing this.  She recently moved and at first attributed her symptoms to moving, sleeping on a mattress, etc.  The pain continues.  It is in her right mid, and it wraps around at times to her right flank.  She does not have any association of pain with eating.  Denies any pain with eating certain type of foods.  Denies: Unintentional weight loss, fever, rectal bleeding, stool incontinence, numbness or tingling in legs.  Health Maintenance: Immunizations -- UTD Colonoscopy -- N/A Mammogram -- N/A PAP -- done 10/2018 normal per pt will obtain records Bone Density -- N/A Weight -- Weight: 289 lb 6.1 oz (131.3 kg)    Depression screen [facility] 2/9 10/31/2019  Decreased Interest 0  Down, Depressed, Hopeless 0  PHQ - 2 Score 0  Altered sleeping 0  Tired, decreased energy 0  Change in appetite 0  Feeling bad or failure about yourself  0  Trouble concentrating 0  Moving slowly or fidgety/restless 0  Suicidal thoughts 0  PHQ-9 Score 0  Difficult doing work/chores -    GAD 7 : Generalized Anxiety Score 11/24/2019  Nervous, Anxious, on Edge 2  Control/stop worrying  2  Worry too much - different things 3  Trouble relaxing 2  Restless 2  Easily annoyed or irritable 2  Afraid - awful might happen 1  Total GAD 7 Score 14  Anxiety Difficulty Somewhat difficult     Other providers/specialists: Patient Care Team: Jarold Motto, [place] as PCP - General (Physician Assistant) Ward, Clois Comber, FNP as Nurse Practitioner (Pulmonary Disease)   Past Medical History:  Diagnosis Date  . Allergies   . Anxiety   . Asthma   . Back pain   . GERD (gastroesophageal reflux disease)   . Heartburn   . History of stomach ulcers   . Hyperlipidemia   . Joint pain      Social History   Socioeconomic History  . Marital status: Single    Spouse name: Not on file  . Number of children: Not on file  . Years of education: Not on file  . Highest education level: Not on file  Occupational History  . Not on file  Tobacco Use  . Smoking status: Never Smoker  . Smokeless tobacco: Never Used  Substance and Sexual Activity  . Alcohol use: Yes    Comment: socially  . Drug use: No  . Sexual activity: Yes    Birth control/protection: Pill  Other Topics Concern  . Not on file  Social History Narrative  Biology degree --> wants to do research   Works at a [employer]    No children or pets   Social Determinants of Corporate investment banker Strain:   . Difficulty of Paying Living Expenses: Not on file  Food Insecurity:   . Worried About Programme researcher, broadcasting/film/video in the Last Year: Not on file  . Ran Out of Food in the Last Year: Not on file  Transportation Needs:   . Lack of Transportation (Medical): Not on file  . Lack of Transportation (Non-Medical): Not on file  Physical Activity:   . Days of Exercise per Week: Not on file  . Minutes of Exercise per Session: Not on file  Stress:   . Feeling of Stress : Not on file  Social Connections:   . Frequency of Communication with Friends and Family: Not on file  . Frequency of Social  Gatherings with Friends and Family: Not on file  . Attends Religious Services: Not on file  . Active Member of Clubs or Organizations: Not on file  . Attends Banker Meetings: Not on file  . Marital Status: Not on file  Intimate Partner Violence:   . Fear of Current or Ex-Partner: Not on file  . Emotionally Abused: Not on file  . Physically Abused: Not on file  . Sexually Abused: Not on file    Past Surgical History:  Procedure Laterality Date  . WISDOM TOOTH EXTRACTION      Family History  Problem Relation Age of Onset  . Asthma Mother   . Obesity Mother   . Asthma Maternal Grandmother   . Stomach cancer Maternal Grandmother   . Hyperlipidemia Father   . Atrial fibrillation Father        had ablation and is now in rhythm  . Lung cancer Maternal Grandfather   . Prostate cancer Maternal Grandfather   . Colon cancer Maternal Grandfather        late 60's/early 55's  . Alzheimer's disease Maternal Grandfather   . Brain cancer Paternal Grandmother   . Lung cancer Paternal Grandfather   . Allergic rhinitis Neg Hx   . Angioedema Neg Hx   . Eczema Neg Hx   . Immunodeficiency Neg Hx   . Urticaria Neg Hx     Allergies  Allergen Reactions  . Clindamycin Rash  . Codeine Rash  . Hydrocodone Rash  . Prednisone Rash     Current Medications:   Current Outpatient Medications:  .  albuterol (PROAIR HFA) 108 (90 Base) MCG/ACT inhaler, Inhale 2 puffs into the lungs every 6 (six) hours as needed for wheezing or shortness of breath., Disp: , Rfl:  .  b complex vitamins capsule, Take by mouth., Disp: , Rfl:  .  cetirizine (ZYRTEC) 10 MG tablet, Take 10 mg by mouth daily., Disp: , Rfl:  .  diphenhydrAMINE HCl, Sleep, (ZZZQUIL PO), Take by mouth as needed. , Disp: , Rfl:  .  drospirenone-ethinyl estradiol (YAZ,GIANVI,LORYNA) 3-0.02 MG tablet, Take 1 tablet by mouth daily., Disp: , Rfl:  .  escitalopram (LEXAPRO) 10 MG tablet, Take 1 tablet (10 mg total) by mouth daily.,  Disp: 30 tablet, Rfl: 0 .  fluticasone (FLONASE) 50 MCG/ACT nasal spray, Place 1 spray into both nostrils daily., Disp: , Rfl:  .  ibuprofen (ADVIL) 800 MG tablet, Take 800 mg by mouth every 8 (eight) hours as needed., Disp: , Rfl:  .  pantoprazole (PROTONIX) 40 MG tablet, Take  40 mg by mouth daily. , Disp: , Rfl:  .  traZODone (DESYREL) 50 MG tablet, Take 0.5-1 tablets (25-50 mg total) by mouth at bedtime as needed for sleep., Disp: 30 tablet, Rfl: 0 .  Vitamin D, Ergocalciferol, (DRISDOL) 1.25 MG (50000 UNIT) CAPS capsule, Take 1 capsule (50,000 Units total) by mouth every 7 (seven) days., Disp: 4 capsule, Rfl: 0   Review of Systems:   ROS  Negative unless otherwise specified per HPI.   Vitals:   Vitals:   11/24/19 0840  BP: 124/86  Pulse: 98  Temp: (!) 97.2 F (36.2 C)  TempSrc: Temporal  SpO2: 97%  Weight: 289 lb 6.1 oz (131.3 kg)  Height: 5\' 8"  (1.727 m)      Body mass index is 44 kg/m.  Physical Exam:   Physical Exam Vitals and nursing note reviewed.  Constitutional:      General: She is not in acute distress.    Appearance: She is well-developed. She is not ill-appearing or toxic-appearing.  Cardiovascular:     Rate and Rhythm: Normal rate and regular rhythm.     Pulses: Normal pulses.     Heart sounds: Normal heart sounds, S1 normal and S2 normal.     Comments: No LE edema Pulmonary:     Effort: Pulmonary effort is normal.     Breath sounds: Normal breath sounds.  Abdominal:     General: Abdomen is flat. Bowel sounds are normal.     Palpations: Abdomen is soft.     Tenderness: There is no abdominal tenderness. There is no right CVA tenderness or left CVA tenderness. Negative signs include Murphy's sign.  Musculoskeletal:     Comments: No decreased ROM 2/2 pain with flexion/extension, lateral side bends, or rotation. No reproducible tenderness with deep palpation to bilateral paraspinal muscles. No bony tenderness. No evidence of erythema, rash or ecchymosis.     Skin:    General: Skin is warm and dry.  Neurological:     Mental Status: She is alert.     GCS: GCS eye subscore is 4. GCS verbal subscore is 5. GCS motor subscore is 6.  Psychiatric:        Speech: Speech normal.        Behavior: Behavior normal. Behavior is cooperative.       Assessment and Plan:   Ashley Acevedo was seen today for establish care and back pain.  Diagnoses and all orders for this visit:  Chronic right-sided thoracic back pain -     Ambulatory referral to Sports Medicine   Unclear etiology of symptoms. Possible muscle strain vs other.  Will refer to Dr. Lynne Leader, and leave imaging modalities up to his discretion.  Patient is agreeable to plan.  No red flags on discussion  . Reviewed expectations re: course of current medical issues. . Discussed self-management of symptoms. . Outlined signs and symptoms indicating need for more acute intervention. . Patient verbalized understanding and all questions were answered. . See orders for this visit as documented in the electronic medical record. . Patient received an After-Visit Summary.  CMA or LPN served as scribe during this visit. History, Physical, and Plan performed by medical provider. The above documentation has been reviewed and is accurate and complete.   Inda Coke, PA-C

## 2019-11-24 ENCOUNTER — Ambulatory Visit (INDEPENDENT_AMBULATORY_CARE_PROVIDER_SITE_OTHER): Payer: BC Managed Care – PPO | Admitting: Physician Assistant

## 2019-11-24 ENCOUNTER — Encounter: Payer: Self-pay | Admitting: Physician Assistant

## 2019-11-24 ENCOUNTER — Other Ambulatory Visit: Payer: Self-pay

## 2019-11-24 VITALS — BP 124/86 | HR 98 | Temp 97.2°F | Ht 68.0 in | Wt 289.4 lb

## 2019-11-24 DIAGNOSIS — M546 Pain in thoracic spine: Secondary | ICD-10-CM

## 2019-11-24 DIAGNOSIS — G8929 Other chronic pain: Secondary | ICD-10-CM | POA: Diagnosis not present

## 2019-11-24 NOTE — Patient Instructions (Signed)
It was great to see you!  A referral has been placed for you to see Dr. Evan Corey with [facility]. Someone from there office will be in touch soon regarding your appointment with him. His location: [facility] [place] [address] on the 1st floor.   Phone number [phone], Fax [phone].  This location is across the street from the entrance to [facility] and in the same complex as the [facility] and [facility]   Take care,  Jyssica Rief PA-C  

## 2019-11-28 ENCOUNTER — Encounter: Payer: Self-pay | Admitting: Family Medicine

## 2019-11-28 ENCOUNTER — Ambulatory Visit (INDEPENDENT_AMBULATORY_CARE_PROVIDER_SITE_OTHER): Payer: BC Managed Care – PPO | Admitting: Family Medicine

## 2019-11-28 ENCOUNTER — Ambulatory Visit (INDEPENDENT_AMBULATORY_CARE_PROVIDER_SITE_OTHER): Payer: BC Managed Care – PPO

## 2019-11-28 ENCOUNTER — Other Ambulatory Visit: Payer: Self-pay

## 2019-11-28 VITALS — BP 122/88 | HR 97 | Ht 68.0 in | Wt 290.6 lb

## 2019-11-28 DIAGNOSIS — M549 Dorsalgia, unspecified: Secondary | ICD-10-CM | POA: Diagnosis not present

## 2019-11-28 DIAGNOSIS — M48061 Spinal stenosis, lumbar region without neurogenic claudication: Secondary | ICD-10-CM | POA: Diagnosis not present

## 2019-11-28 DIAGNOSIS — M4134 Thoracogenic scoliosis, thoracic region: Secondary | ICD-10-CM | POA: Diagnosis not present

## 2019-11-28 NOTE — Patient Instructions (Signed)
Thank you for coming in today. Attend PT.  Use heat and TENS unit.  Recheck with me in 6 weeks.  Return sooner if needed.  If not doing well let me know.   Come back or go to the emergency room if you notice new weakness new numbness problems walking or bowel or bladder problems.  TENS UNIT: This is helpful for muscle pain and spasm.   Search and Purchase a TENS 7000 2nd edition at  www.tenspros.com or www.Amazon.com It should be less than $30.     TENS unit instructions: Do not shower or bathe with the unit on Turn the unit off before removing electrodes or batteries If the electrodes lose stickiness add a drop of water to the electrodes after they are disconnected from the unit and place on plastic sheet. If you continued to have difficulty, call the TENS unit company to purchase more electrodes. Do not apply lotion on the skin area prior to use. Make sure the skin is clean and dry as this will help prolong the life of the electrodes. After use, always check skin for unusual red areas, rash or other skin difficulties. If there are any skin problems, does not apply electrodes to the same area. Never remove the electrodes from the unit by pulling the wires. Do not use the TENS unit or electrodes other than as directed. Do not change electrode placement without consultating your therapist or physician. Keep 2 fingers with between each electrode. Wear time ratio is 2:1, on to off times.    For example on for 30 minutes off for 15 minutes and then on for 30 minutes off for 15 minutes

## 2019-11-28 NOTE — Progress Notes (Signed)
Subjective:    I'm seeing this patient as a consultation for:  Rinaldo Cloud, [place]. Note will be routed back to referring provider/PCP.  CC: R-sided T-spine pain  I, Molly Weber, LAT, ATC, am serving as scribe for Dr. Clementeen Graham.  HPI: Pt is a 24 y/o female presenting w/ R-sided T-spine pain x 2 months w/ no known MOI.  She rates her pain at a 4-5/10 and describes her pain as dull, constant pain  .  She notes that she had a similar issue in 2018 that resolved itself.  Pt notes that she is working from home and sitting a lot during the day.  Moved new home in late November and pain started mid to late December.   Radiating pain: No UE weakness: No UE numbness/tingling: No Aggravating factors: None noted Treatments tried: Heat, ice, IBU w/ limited relief  Diagnostic testing:  Past medical history, Surgical history, Family history, Social history, Allergies, and medications have been entered into the medical record, reviewed.   Review of Systems: No new headache, visual changes, nausea, vomiting, diarrhea, constipation, dizziness, abdominal pain, skin rash, fevers, chills, night sweats, weight loss, swollen lymph nodes, body aches, joint swelling, muscle aches, chest pain, shortness of breath, mood changes, visual or auditory hallucinations.   Objective:    Vitals:   11/28/19 1021  BP: 122/88  Pulse: 97  SpO2: 98%   General: Well Developed, well nourished, and in no acute distress.  Neuro/Psych: Alert and oriented x3, extra-ocular muscles intact, able to move all 4 extremities, sensation grossly intact. Skin: Warm and dry, no rashes noted.  Respiratory: Not using accessory muscles, speaking in full sentences, trachea midline.  Cardiovascular: Pulses palpable, no extremity edema. Abdomen: Does not appear distended. MSK:  T-spine: Nontender to spinal midline.  Minimally tender palpation right lower thoracic paraspinal musculature. L-spine: Nontender midline.  Mildly tender  palpation right upper lumbar paraspinal musculature. Normal lumbar and thoracic motion. Normal upper and lower extremity motion and strength.  Reflexes cap refill and sensation are intact throughout.  Lab and Radiology Results  X-ray images obtained today personally and independently reviewed.  T-spine: No acute fractures or severe degenerative changes.  Trace scoliosis convex left  L-spine: Mild to moderate DDD and facet DJD L5-S1.  No acute fractures or severe degenerative changes.  Await formal radiology review  Impression and Recommendations:    Assessment and Plan: 24 y.o. female with persistent right lower thoracic upper lumbar back pain.  Pain at this point due to myofascial dysfunction and spasm.  Very unlikely to be due to significant nerve or bone injury or other cause.  X-rays today show mild degenerative scoliosis changes however radiology overread is still pending.  Plan for trial of physical therapy.  Also recommend heat and TENS unit.  Recheck back in 6 weeks.  If not better would consider advanced imaging such as MRI.  Precautions reviewed.   Orders Placed This Encounter  Procedures  . DG Thoracic Spine 2 View    Standing Status:   Future    Number of Occurrences:   1    Standing Expiration Date:   01/25/2021    Order Specific Question:   Reason for Exam (SYMPTOM  OR DIAGNOSIS REQUIRED)    Answer:   eval right low to midback pain x 2 months    Order Specific Question:   Is patient pregnant?    Answer:   No    Order Specific Question:   Preferred imaging location?  Answer:   [facility]    Order Specific Question:   Radiology Contrast Protocol - do NOT remove file path    Answer:   \\[email or web address]  . DG Lumbar Spine 2-3 Views    Standing Status:   Future    Number of Occurrences:   1    Standing Expiration Date:   01/25/2021    Order Specific Question:   Reason for Exam (SYMPTOM  OR DIAGNOSIS REQUIRED)     Answer:   eval right upper lumbar pain x 2 months    Order Specific Question:   Is patient pregnant?    Answer:   No    Order Specific Question:   Preferred imaging location?    Answer:   [facility]    Order Specific Question:   Radiology Contrast Protocol - do NOT remove file path    Answer:   \\[email or web address]  . Ambulatory referral to Physical Therapy    Referral Priority:   Routine    Referral Type:   Physical Medicine    Referral Reason:   Specialty Services Required    Requested Specialty:   Physical Therapy   No orders of the defined types were placed in this encounter.   Discussed warning signs or symptoms. Please see discharge instructions. Patient expresses understanding.   The above documentation has been reviewed and is accurate and complete Lynne Leader

## 2019-11-29 NOTE — Progress Notes (Signed)
X-ray thoracic spine shows minimal scoliosis.  No significant arthritis.

## 2019-11-29 NOTE — Progress Notes (Signed)
X-ray shows minimal arthritis changes in low back.  Minimal scoliosis present.  Otherwise pretty normal looking back x-ray.

## 2019-12-12 ENCOUNTER — Ambulatory Visit (INDEPENDENT_AMBULATORY_CARE_PROVIDER_SITE_OTHER): Payer: BC Managed Care – PPO | Admitting: Family Medicine

## 2019-12-12 ENCOUNTER — Encounter (INDEPENDENT_AMBULATORY_CARE_PROVIDER_SITE_OTHER): Payer: Self-pay | Admitting: Family Medicine

## 2019-12-12 ENCOUNTER — Other Ambulatory Visit: Payer: Self-pay

## 2019-12-12 VITALS — BP 118/84 | HR 103 | Temp 98.5°F | Ht 68.0 in | Wt 285.0 lb

## 2019-12-12 DIAGNOSIS — G4709 Other insomnia: Secondary | ICD-10-CM

## 2019-12-12 DIAGNOSIS — E559 Vitamin D deficiency, unspecified: Secondary | ICD-10-CM | POA: Diagnosis not present

## 2019-12-12 DIAGNOSIS — E8881 Metabolic syndrome: Secondary | ICD-10-CM | POA: Diagnosis not present

## 2019-12-12 DIAGNOSIS — E538 Deficiency of other specified B group vitamins: Secondary | ICD-10-CM | POA: Diagnosis not present

## 2019-12-12 DIAGNOSIS — Z9189 Other specified personal risk factors, not elsewhere classified: Secondary | ICD-10-CM

## 2019-12-12 DIAGNOSIS — E88819 Insulin resistance, unspecified: Secondary | ICD-10-CM

## 2019-12-12 DIAGNOSIS — Z6841 Body Mass Index (BMI) 40.0 and over, adult: Secondary | ICD-10-CM

## 2019-12-12 DIAGNOSIS — F419 Anxiety disorder, unspecified: Secondary | ICD-10-CM

## 2019-12-12 MED ORDER — ESCITALOPRAM OXALATE 10 MG PO TABS: 10.0000 mg | ORAL_TABLET | Freq: Every day | ORAL | 0 refills | Status: DC

## 2019-12-12 MED ORDER — VITAMIN D (ERGOCALCIFEROL) 1.25 MG (50000 UNIT) PO CAPS: 50000.0000 [IU] | ORAL_CAPSULE | ORAL | 0 refills | Status: DC

## 2019-12-12 NOTE — Progress Notes (Signed)
Chief Complaint:   OBESITY Ashley Acevedo is here to discuss her progress with her obesity treatment plan along with follow-up of her obesity related diagnoses. Ashley Acevedo is on the Category 3 Plan and states she is following her eating plan approximately 75% of the time. Ashley Acevedo states she is walking for 60 minutes 3 times per week.  Today's visit was #: 7 Starting weight: 295 lbs Starting date: 10/31/2019 Today's weight: 285 lbs Today's date: 12/12/2019 Total lbs lost to date: 10 lbs Total lbs lost since last in-office visit: 0  Interim History: Food recall:  Breakfast:  Two eggs with raspberries.  Lunch:  Frozen meal or sandwich.  Dinner will be chicken and roasted vegetables.  Snack will be yogurt, cheese, almonds.  She has increased water consumption.  She will be back at work 8-5 starting today.    Subjective:   1. Insulin resistance Ashley Acevedo has a diagnosis of insulin resistance based on her elevated fasting insulin level >5. She continues to work on diet and exercise to decrease her risk of diabetes.  Lab Results  Component Value Date   INSULIN 27.6 (H) 08/24/2019   Lab Results  Component Value Date   HGBA1C 4.9 08/24/2019   2. Vitamin D deficiency Ashley Acevedo's Vitamin D level was 25.3 on 08/24/2019. She is currently taking vit D. She denies nausea, vomiting or muscle weakness.  3. B12 nutritional deficiency She is not a vegetarian.  She does not have a previous diagnosis of pernicious anemia.  She does not have a history of weight loss surgery.  She is taking a B complex vitamin.  Lab Results  Component Value Date   VITAMINB12 284 08/24/2019   4. Other insomnia Latoria takes trazodone for sleep.  5. Anxiety, with emotional eating Ashley Acevedo started Lexapro 10 mg daily at last visit.  Assessment/Plan:   1. Insulin resistance Ashley Acevedo will continue to work on weight loss, exercise, and decreasing simple carbohydrates to help decrease the risk of diabetes. Ashley Acevedo agreed to follow-up with Ashley Acevedo  as directed to closely monitor her progress.  2. Vitamin D deficiency Low Vitamin D level contributes to fatigue and are associated with obesity, breast, and colon cancer. She agrees to continue to take prescription Vitamin D @50 ,000 IU every week and will follow-up for routine testing of Vitamin D, at least 2-3 times per year to avoid over-replacement.  Orders - Vitamin D, Ergocalciferol, (DRISDOL) 1.25 MG (50000 UNIT) CAPS capsule; Take 1 capsule (50,000 Units total) by mouth every 7 (seven) days.  Dispense: 4 capsule; Refill: 0  3. B12 nutritional deficiency The diagnosis was reviewed with the patient. Counseling provided today, see below. We will continue to monitor. Orders and follow up as documented in patient record.  Counseling . The body needs vitamin B12: to make red blood cells; to make DNA; and to help the nerves work properly so they can carry messages from the brain to the body.  . The main causes of vitamin B12 deficiency include dietary deficiency, digestive diseases, pernicious anemia, and having a surgery in which part of the stomach or small intestine is removed.  . Certain medicines can make it harder for the body to absorb vitamin B12. These medicines include: heartburn medications; some antibiotics; some medications used to treat diabetes, gout, and high cholesterol.  . In some cases, there are no symptoms of this condition. If the condition leads to anemia or nerve damage, various symptoms can occur, such as weakness or fatigue, shortness of breath, and numbness  or tingling in your hands and feet.   . Treatment:  o May include taking vitamin B12 supplements.  o Avoid alcohol.  o Eat lots of healthy foods that contain vitamin B12: - Beef, pork, chicken, [place], and organ meats, such as liver.  - Seafood: This includes clams, rainbow trout, salmon, tuna, and haddock. Eggs.  - Cereal and dairy products that are fortified: This means that vitamin B12 has been added to the  food.   4. Other insomnia The problem of recurrent insomnia was discussed. Orders and follow up as documented in patient record. Counseling: Intensive lifestyle modifications are the first line treatment for this issue. We discussed several lifestyle modifications today and she will continue to work on diet, exercise and weight loss efforts.   Counseling  Limit or avoid alcohol, caffeinated beverages, and cigarettes, especially close to bedtime.   Do not eat a large meal or eat spicy foods right before bedtime. This can lead to digestive discomfort that can make it hard for you to sleep.  Keep a sleep diary to help you and your health care provider figure out what could be causing your insomnia.  . Make your bedroom a dark, comfortable place where it is easy to fall asleep. ? Put up shades or blackout curtains to block light from outside. ? Use a white noise machine to block noise. ? Keep the temperature cool. . Limit screen use before bedtime. This includes: ? Watching TV. ? Using your smartphone, tablet, or computer. . Stick to a routine that includes going to bed and waking up at the same times every day and night. This can help you fall asleep faster. Consider making a quiet activity, such as reading, part of your nighttime routine. . Try to avoid taking naps during the day so that you sleep better at night. . Get out of bed if you are still awake after 15 minutes of trying to sleep. Keep the lights down, but try reading or doing a quiet activity. When you feel sleepy, go back to bed.  5. Anxiety, with emotional eating Behavior modification techniques were discussed today to help Ashley Acevedo deal with her anxiety.  Orders and follow up as documented in patient record.   Orders - escitalopram (LEXAPRO) 10 MG tablet; Take 1 tablet (10 mg total) by mouth daily.  Dispense: 90 tablet; Refill: 0  6. At risk for diabetes mellitus Ashley Acevedo was given approximately 15 minutes of diabetes education and  counseling today. We discussed intensive lifestyle modifications today with an emphasis on weight loss as well as increasing exercise and decreasing simple carbohydrates in her diet. We also reviewed medication options with an emphasis on risk versus benefit of those discussed.   Repetitive spaced learning was employed today to elicit superior memory formation and behavioral change.  7. Class 3 severe obesity with serious comorbidity and body mass index (BMI) of 40.0 to 44.9 in adult, unspecified obesity type [facility]) Ashley Acevedo is currently in the action stage of change. As such, her goal is to continue with weight loss efforts. She has agreed to the Category 3 Plan.   Exercise goals: As is.  Behavioral modification strategies: increasing lean protein intake, increasing water intake and keeping a strict food journal.  Brenlynn has agreed to follow-up with our clinic in 3 weeks. She was informed of the importance of frequent follow-up visits to maximize her success with intensive lifestyle modifications for her multiple health conditions.   Objective:   Blood pressure 118/84, pulse Marland Kitchen)  103, temperature 98.5 F (36.9 C), temperature source Oral, height 5\' 8"  (1.727 m), weight 285 lb (129.3 kg), last menstrual period 12/09/2019, SpO2 97 %. Body mass index is 43.33 kg/m.  General: Cooperative, alert, well developed, in no acute distress. HEENT: Conjunctivae and lids unremarkable. Cardiovascular: Regular rhythm.  Lungs: Normal work of breathing. Neurologic: No focal deficits.   Lab Results  Component Value Date   HGBA1C 4.9 08/24/2019   Lab Results  Component Value Date   INSULIN 27.6 (H) 08/24/2019   Lab Results  Component Value Date   HCT 37.0 08/24/2019   Lab Results  Component Value Date   IRON 66 08/24/2019   TIBC 360 08/24/2019   FERRITIN 35 08/24/2019   Attestation Statements:   Reviewed by clinician on day of visit: allergies, medications, problem list, medical history,  surgical history, family history, social history, and previous encounter notes.  I, 13/08/2019, CMA, am acting as Insurance claims handler for Energy manager DO.  I have reviewed the above documentation for accuracy and completeness, and I agree with the above. [employer], DO

## 2019-12-16 ENCOUNTER — Ambulatory Visit: Payer: BC Managed Care – PPO | Attending: Family Medicine | Admitting: Physical Therapy

## 2019-12-16 ENCOUNTER — Other Ambulatory Visit: Payer: Self-pay

## 2019-12-16 ENCOUNTER — Encounter: Payer: Self-pay | Admitting: Physical Therapy

## 2019-12-16 DIAGNOSIS — M62830 Muscle spasm of back: Secondary | ICD-10-CM | POA: Diagnosis not present

## 2019-12-16 DIAGNOSIS — R293 Abnormal posture: Secondary | ICD-10-CM | POA: Diagnosis not present

## 2019-12-16 NOTE — Therapy (Signed)
[place], [place], [address] Phone: [phone]   Fax:  [phone]  Physical Therapy Evaluation  Patient Details  Name: Ashley Acevedo MRN: [id number] Date of Birth: 12-27-1995 Referring Provider (PT): Gregor Hams, MD   Encounter Date: 12/16/2019  PT End of Session - 12/16/19 0801    Visit Number  1    Number of Visits  13    Date for PT Re-Evaluation  01/27/20    Authorization Type  [employer] 30VL    PT Start Time  0716    PT Stop Time  0755    PT Time Calculation (min)  39 min    Activity Tolerance  Patient tolerated treatment well    Behavior During Therapy  [facility] for tasks assessed/performed       Past Medical History:  Diagnosis Date  . Allergies   . Anxiety   . Asthma   . Back pain   . GERD (gastroesophageal reflux disease)   . Heartburn   . History of stomach ulcers   . Hyperlipidemia   . Joint pain     Past Surgical History:  Procedure Laterality Date  . WISDOM TOOTH EXTRACTION      There were no vitals filed for this visit.   Subjective Assessment - 12/16/19 0722    Subjective  I moved in Nov and doing a lot of renovations. I don't know what happened. Points to lower Rt rib cage- something is pressing. sometimes goes higher. every now and then Rt elbow hurts- hyperextended in high school. First week back in office in  a year and cannot use hot pack    Currently in Pain?  Yes    Pain Score  4     Pain Location  Back    Pain Orientation  Right;Mid    Pain Descriptors / Indicators  Sore    Aggravating Factors   twisting to Rt    Pain Relieving Factors  TENS, heat, sleep on Lt         [facility] PT Assessment - 12/16/19 0001      Assessment   Medical Diagnosis  back pain    Referring Provider (PT)  Gregor Hams, MD    Hand Dominance  Right      Precautions   Precautions  None      Restrictions   Weight Bearing Restrictions  No      Balance Screen   Has the patient fallen  in the past 6 months  No      [place] residence    Living Arrangements  Spouse/significant other      Prior Function   Level of Independence  Independent    Vocation  Full time employment    Vocation Requirements  on a computer      Cognition   Overall Cognitive Status  Within Functional Limits for tasks assessed      Sensation   Additional Comments  [facility]      Posture/Postural Control   Posture Comments  forward head, Rt GHJ depression, flat thoracic spine, incr lumbar lordosis.      ROM / Strength   AROM / PROM / Strength  AROM      AROM   Overall AROM Comments  WFL      Palpation   Palpation comment  concordant pain Rt-lower ribs &costovertebral joints  Objective measurements completed on examination: See above findings.      [facility] Adult PT Treatment/Exercise - 12/16/19 0001      Exercises   Exercises  Shoulder      Shoulder Exercises: Seated   External Rotation  10 reps    Theraband Level (Shoulder External Rotation)  Level 2 (Red)      Shoulder Exercises: Stretch   Other Shoulder Stretches  cat-camel-child pose & with sidebend    Other Shoulder Stretches  thoracic ext over chair, seated sidebend arm OH      Manual Therapy   Manual Therapy  Joint mobilization;Soft tissue mobilization    Joint Mobilization  Rt lower costovertebral PAs    Soft tissue mobilization  Rt thoracic paraspinals & into intercostals              PT Education - 12/16/19 0803    Education Details  anatomy of condition, POC, HEP, exercise form/rationale    Person(s) Educated  Patient    Methods  Explanation;Demonstration;Tactile cues;Verbal cues;Handout    Comprehension  Verbalized understanding;Returned demonstration;Verbal cues required;Tactile cues required;Need further instruction       PT Short Term Goals - 12/16/19 0801      PT SHORT TERM GOAL #1   Title  verbalize use & understanding of exercises during  her day    Baseline  began educating at eval    Time  3    Period  Weeks    Status  New    Target Date  01/06/20        PT Long Term Goals - 12/16/19 0759      PT LONG TERM GOAL #1   Title  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 computer using exercises    Baseline  began educating at eval    Time  6    Period  Weeks    Status  New    Target Date  01/27/20      PT LONG TERM GOAL #2   Title  demo improved posture while seated at computer    Baseline  see flowsheet for postural deviations      PT LONG TERM GOAL #3   Title  reduction in right elbow pain    Baseline  reports beginning with back pain      PT LONG TERM GOAL #4   Title  able to sleep on Rt side    Baseline  wakes up if rolls onto that side & wakes with pain             Plan - 12/16/19 0753    Clinical Impression Statement  Pt presents to PT with complaints of Rt-sided lower thoracic pain consistent with muscular spasm. 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 We discussed this and the importance of movement and postural training. Concordant pain in intercostals and paraspinals at costovertebral joints in lower right rib cage. H/o Rt elbow injury could possibly be aggrivated by postural deviations as she is Right-handed but has been doing a lot of remodeling at her house so could also be due to overuse. Manual therapy today to reduce spasm and advised that she may be sore. Will benefit from skilled PT in order to address pain and reach goals.    Examination-Activity Limitations  Reach Overhead;Bed Mobility;Sleep;Bend;Sit;Stand;Lift    Examination-Participation Restrictions  Other    Stability/Clinical Decision Making  Stable/Uncomplicated    Clinical Decision  Making  Low    Rehab Potential  Good    PT Frequency  2x / week    PT Duration  6 weeks    PT Treatment/Interventions  ADLs/Self Care Home Management;Cryotherapy;Electrical  Stimulation;Functional mobility training;Ultrasound;Traction;Moist Heat;Therapeutic activities;Therapeutic exercise;Neuromuscular re-education;Patient/family education;Passive range of motion;Manual techniques;Dry needling;Taping;Spinal Manipulations;Joint Manipulations    PT Next Visit Plan  DN PRN, STM, abdominal engagement    PT Home Exercise Plan  cat-camel-child pose, seated extension, seated sidebend, GHJ ER red tband    Consulted and Agree with Plan of Care  Patient       Patient will benefit from skilled therapeutic intervention in order to improve the following deficits and impairments:  Increased muscle spasms, Decreased activity tolerance, Pain, Improper body mechanics, Postural dysfunction  Visit Diagnosis: Muscle spasm of back - Plan: PT plan of care cert/re-cert  Abnormal posture - Plan: PT plan of care cert/re-cert     Problem List Patient Active Problem List   Diagnosis Date Noted  . Mild persistent asthma without complication 11/02/2017  . Seasonal allergic conjunctivitis 11/02/2017  . Anxiety 04/10/2016    Demari Gales C. Xiamara Hulet PT, DPT 12/16/19 8:06 AM   [facility] [address] [place], [place], [address] Phone: [phone]   Fax:  [phone]  Name: Ashley Acevedo MRN: [id number] Date of Birth: 11/15/1995

## 2019-12-30 ENCOUNTER — Other Ambulatory Visit: Payer: Self-pay

## 2019-12-30 ENCOUNTER — Ambulatory Visit: Payer: BC Managed Care – PPO | Admitting: Physical Therapy

## 2019-12-30 ENCOUNTER — Encounter: Payer: Self-pay | Admitting: Physical Therapy

## 2019-12-30 DIAGNOSIS — M62830 Muscle spasm of back: Secondary | ICD-10-CM | POA: Diagnosis not present

## 2019-12-30 DIAGNOSIS — R293 Abnormal posture: Secondary | ICD-10-CM

## 2019-12-30 NOTE — Therapy (Signed)
[place], [place], [address] Phone: [phone]   Fax:  [phone]  Physical Therapy Treatment  Patient Details  Name: Ashley Acevedo MRN: [id number] Date of Birth: 06-16-1996 Referring Provider (PT): Gregor Hams, MD   Encounter Date: 12/30/2019  PT End of Session - 12/30/19 1345    Visit Number  2    Number of Visits  13    Date for PT Re-Evaluation  01/27/20    Authorization Type  [employer]BS 30VL    PT Start Time  0845    PT Stop Time  0930    PT Time Calculation (min)  45 min    Activity Tolerance  Patient tolerated treatment well    Behavior During Therapy  [facility] for tasks assessed/performed       Past Medical History:  Diagnosis Date  . Allergies   . Anxiety   . Asthma   . Back pain   . GERD (gastroesophageal reflux disease)   . Heartburn   . History of stomach ulcers   . Hyperlipidemia   . Joint pain     Past Surgical History:  Procedure Laterality Date  . WISDOM TOOTH EXTRACTION      There were no vitals filed for this visit.  Subjective Assessment - 12/30/19 1344    Subjective  Still feeling it at work but seems to have moved up toward my shoulder blade. Doing HEP 2-3/week    Currently in Pain?  Yes    Pain Score  3     Pain Location  Back    Pain Orientation  Right;Mid    Pain Descriptors / Indicators  Aching                       [facility] Adult PT Treatment/Exercise - 12/30/19 0001      Shoulder Exercises: Supine   Horizontal ABduction  10 reps    Theraband Level (Shoulder Horizontal ABduction)  Level 2 (Red)      Shoulder Exercises: Seated   Theraband Level (Shoulder External Rotation)  Level 2 (Red)    External Rotation Limitations  elbows at side    Theraband Level (Shoulder Diagonals)  Level 2 (Red)    Diagonals Limitations  extending arm s & returning to midline      Shoulder Exercises: Prone   External Rotation Limitations  Qped upper body rotation  HBH    Other Prone Exercises  prone press ups      Shoulder Exercises: Stretch   Other Shoulder Stretches  cat-cow    Other Shoulder Stretches  thoracic ext with bridge over foam roll      Manual Therapy   Joint Mobilization  rib mobility focused on Lt side hypomobility; mid thoracic PA gr 4; prone rib mobility end of session bilaterally               PT Short Term Goals - 12/16/19 0801      PT SHORT TERM GOAL #1   Title  verbalize use & understanding of exercises during her day    Baseline  began educating at eval    Time  3    Period  Weeks    Status  New    Target Date  01/06/20        PT Long Term Goals - 12/16/19 0759      PT LONG TERM GOAL #1   Title  will be able to tolerate work  day on computer using exercises    Baseline  began educating at eval    Time  6    Period  Weeks    Status  New    Target Date  01/27/20      PT LONG TERM GOAL #2   Title  demo improved posture while seated at computer    Baseline  see flowsheet for postural deviations      PT LONG TERM GOAL #3   Title  reduction in right elbow pain    Baseline  reports beginning with back pain      PT LONG TERM GOAL #4   Title  able to sleep on Rt side    Baseline  wakes up if rolls onto that side & wakes with pain            Plan - 12/30/19 1058    Clinical Impression Statement  Today the patient reports that her mid thoracic pain has traveled to her distal scapular region. She reports a 3/10 pain and stated that her pain has improved but has only been performing her HEP 2 days/week. Patient is painful in the right scapular region (just distal to the inferior angle) when performing upper extremity resistive exercises. She would benefit from skilled PT in order to further decrease pain and increase strengthening in the UE.    PT Treatment/Interventions  ADLs/Self Care Home Management;Cryotherapy;Electrical Stimulation;Functional mobility training;Ultrasound;Traction;Moist  Heat;Therapeutic activities;Therapeutic exercise;Neuromuscular re-education;Patient/family education;Passive range of motion;Manual techniques;Dry needling;Taping;Spinal Manipulations;Joint Manipulations    PT Next Visit Plan  Core strengthing, UE strength and thoracic mobs    PT Home Exercise Plan  cat-camel-child pose, seated extension, seated sidebend, GHJ ER red tband, supine OH band pull, qped thoracic rotation    Consulted and Agree with Plan of Care  Patient       Patient will benefit from skilled therapeutic intervention in order to improve the following deficits and impairments:  Increased muscle spasms, Decreased activity tolerance, Pain, Improper body mechanics, Postural dysfunction  Visit Diagnosis: Muscle spasm of back  Abnormal posture     Problem List Patient Active Problem List   Diagnosis Date Noted  . Mild persistent asthma without complication 11/02/2017  . Seasonal allergic conjunctivitis 11/02/2017  . Anxiety 04/10/2016    Cy Bresee C. Charly Hunton PT, DPT 12/30/19 3:59 PM   [facility] [address] [place], [place], [address] Phone: [phone]   Fax:  [phone]  Name: Ashley Acevedo MRN: [id number] Date of Birth: January 17, 1996

## 2020-01-02 ENCOUNTER — Ambulatory Visit (INDEPENDENT_AMBULATORY_CARE_PROVIDER_SITE_OTHER): Payer: BC Managed Care – PPO | Admitting: Family Medicine

## 2020-01-02 ENCOUNTER — Encounter (INDEPENDENT_AMBULATORY_CARE_PROVIDER_SITE_OTHER): Payer: Self-pay | Admitting: Family Medicine

## 2020-01-02 ENCOUNTER — Other Ambulatory Visit: Payer: Self-pay

## 2020-01-02 VITALS — BP 135/70 | HR 96 | Temp 98.1°F | Ht 68.0 in | Wt 289.0 lb

## 2020-01-02 DIAGNOSIS — Z6841 Body Mass Index (BMI) 40.0 and over, adult: Secondary | ICD-10-CM

## 2020-01-02 DIAGNOSIS — E538 Deficiency of other specified B group vitamins: Secondary | ICD-10-CM

## 2020-01-02 DIAGNOSIS — E8881 Metabolic syndrome: Secondary | ICD-10-CM

## 2020-01-02 DIAGNOSIS — F3289 Other specified depressive episodes: Secondary | ICD-10-CM

## 2020-01-02 DIAGNOSIS — E559 Vitamin D deficiency, unspecified: Secondary | ICD-10-CM

## 2020-01-02 DIAGNOSIS — G4700 Insomnia, unspecified: Secondary | ICD-10-CM | POA: Diagnosis not present

## 2020-01-02 DIAGNOSIS — Z9189 Other specified personal risk factors, not elsewhere classified: Secondary | ICD-10-CM

## 2020-01-02 DIAGNOSIS — E88819 Insulin resistance, unspecified: Secondary | ICD-10-CM

## 2020-01-02 NOTE — Progress Notes (Signed)
Chief Complaint:   OBESITY Ashley Acevedo is here to discuss her progress with her obesity treatment plan along with follow-up of her obesity related diagnoses. Ashley Acevedo is on the Category 3 Plan and states she is following her eating plan approximately 75% of the time. Ashley Acevedo states she is walking for 30-60 minutes 3 times per week.  Today's visit was #: 8 Starting weight: 295 lbs Starting date: 10/31/2019 Today's weight: 289 lbs Today's date: 01/02/2020 Total lbs lost to date: 0 Total lbs lost since last in-office visit: 0  Interim History: Ashley Acevedo is still struggling with dinner.  She says that breakfast and lunch are easy because she is on her own.  She is up 4 pounds of water weight today.  She says she is premenstrual.  She is trying [employer].  Subjective:   1. Vitamin B12 deficiency She is not a vegetarian.  She does not have a previous diagnosis of pernicious anemia.  She does not have a history of weight loss surgery.   Lab Results  Component Value Date   VITAMINB12 284 08/24/2019   2. Insulin resistance Ashley Acevedo has a diagnosis of insulin resistance based on her elevated fasting insulin level >5. She continues to work on diet and exercise to decrease her risk of diabetes.  Lab Results  Component Value Date   INSULIN 27.6 (H) 08/24/2019   Lab Results  Component Value Date   HGBA1C 4.9 08/24/2019   3. Vitamin D deficiency Ashley Acevedo's Vitamin D level was 25.3 on 08/24/2019. She is currently taking vit D. She denies nausea, vomiting or muscle weakness.  4. Insomnia Ashley Acevedo is taking trazodone as needed to help her sleep.   5. Other depression, with emotional eating Ashley Acevedo is struggling with emotional eating and using food for comfort to the extent that it is negatively impacting her health. She has been working on behavior modification techniques to help reduce her emotional eating and has been somewhat successful. She is taking Lexapro 10 mg daily.   Assessment/Plan:   1.  Vitamin B12 deficiency We will continue to monitor. Orders and follow up as documented in patient record.  Counseling . The body needs vitamin B12: to make red blood cells; to make DNA; and to help the nerves work properly so they can carry messages from the brain to the body.  . The main causes of vitamin B12 deficiency include dietary deficiency, digestive diseases, pernicious anemia, and having a surgery in which part of the stomach or small intestine is removed.  . Certain medicines can make it harder for the body to absorb vitamin B12. These medicines include: heartburn medications; some antibiotics; some medications used to treat diabetes, gout, and high cholesterol.  . In some cases, there are no symptoms of this condition. If the condition leads to anemia or nerve damage, various symptoms can occur, such as weakness or fatigue, shortness of breath, and numbness or tingling in your hands and feet.   . Treatment:  o May include taking vitamin B12 supplements.  o Avoid alcohol.  o Eat lots of healthy foods that contain vitamin B12: - Beef, pork, chicken, [place], and organ meats, such as liver.  - Seafood: This includes clams, rainbow trout, salmon, tuna, and haddock. Eggs.  - Cereal and dairy products that are fortified: This means that vitamin B12 has been added to the food.   Orders - Anemia panel  2. Insulin resistance Ashley Acevedo will continue to work on weight loss, exercise, and decreasing simple carbohydrates  to help decrease the risk of diabetes. Ashley Acevedo agreed to follow-up with [place] as directed to closely monitor her progress.  Orders - Comprehensive metabolic panel - Insulin, random  3. Vitamin D deficiency Low Vitamin D level contributes to fatigue and are associated with obesity, breast, and colon cancer. She agrees to continue to take prescription Vitamin D @50 ,000 IU every week and will follow-up for routine testing of Vitamin D, at least 2-3 times per year to avoid  over-replacement.  Orders - VITAMIN D 25 Hydroxy (Vit-D Deficiency, Fractures)  4. Insomnia The problem of recurrent insomnia was discussed. Orders and follow up as documented in patient record. Counseling: Intensive lifestyle modifications are the first line treatment for this issue. We discussed several lifestyle modifications today and she will continue to work on diet, exercise and weight loss efforts.   Counseling  Limit or avoid alcohol, caffeinated beverages, and cigarettes, especially close to bedtime.   Do not eat a large meal or eat spicy foods right before bedtime. This can lead to digestive discomfort that can make it hard for you to sleep.  Keep a sleep diary to help you and your health care provider figure out what could be causing your insomnia.  . Make your bedroom a dark, comfortable place where it is easy to fall asleep. ? Put up shades or blackout curtains to block light from outside. ? Use a white noise machine to block noise. ? Keep the temperature cool. . Limit screen use before bedtime. This includes: ? Watching TV. ? Using your smartphone, tablet, or computer. . Stick to a routine that includes going to bed and waking up at the same times every day and night. This can help you fall asleep faster. Consider making a quiet activity, such as reading, part of your nighttime routine. . Try to avoid taking naps during the day so that you sleep better at night. . Get out of bed if you are still awake after 15 minutes of trying to sleep. Keep the lights down, but try reading or doing a quiet activity. When you feel sleepy, go back to bed.  Orders - CBC with Differential/Platelet - Lipid panel  5. Other depression, with emotional eating Behavior modification techniques were discussed today to help Ashley Acevedo deal with her emotional/non-hunger eating behaviors.  Orders and follow up as documented in patient record.   6. At risk for diabetes mellitus Ashley Acevedo was given  approximately 15 minutes of diabetes education and counseling today. We discussed intensive lifestyle modifications today with an emphasis on weight loss as well as increasing exercise and decreasing simple carbohydrates in her diet. We also reviewed medication options with an emphasis on risk versus benefit of those discussed.   Repetitive spaced learning was employed today to elicit superior memory formation and behavioral change.  7. Class 3 severe obesity with serious comorbidity and body mass index (BMI) of 40.0 to 44.9 in adult, unspecified obesity type Ashley Acevedo) Ashley Acevedo is currently in the action stage of change. As such, her goal is to continue with weight loss efforts. She has agreed to the Category 3 Plan.   Exercise goals: For substantial health benefits, adults should do at least 150 minutes (2 hours and 30 minutes) a week of moderate-intensity, or 75 minutes (1 hour and 15 minutes) a week of vigorous-intensity aerobic physical activity, or an equivalent combination of moderate- and vigorous-intensity aerobic activity. Aerobic activity should be performed in episodes of at least 10 minutes, and preferably, it should be spread throughout  the week.  Behavioral modification strategies: increasing lean protein intake and increasing water intake.  Ashley Acevedo has agreed to follow-up with our clinic in 2 weeks. She was informed of the importance of frequent follow-up visits to maximize her success with intensive lifestyle modifications for her multiple health conditions.   Ashley Acevedo was informed we would discuss her lab results at her next visit unless there is a critical issue that needs to be addressed sooner. Ashley Acevedo agreed to keep her next visit at the agreed upon time to discuss these results.  Objective:   Blood pressure 135/70, pulse 96, temperature 98.1 F (36.7 C), temperature source Oral, height 5\' 8"  (1.727 m), weight 289 lb (131.1 kg), last menstrual period 12/09/2019, SpO2 95 %. Body mass index  is 43.94 kg/m.  General: Cooperative, alert, well developed, in no acute distress. HEENT: Conjunctivae and lids unremarkable. Cardiovascular: Regular rhythm.  Lungs: Normal work of breathing. Neurologic: No focal deficits.   Lab Results  Component Value Date   HGBA1C 4.9 08/24/2019   Lab Results  Component Value Date   INSULIN 27.6 (H) 08/24/2019   Lab Results  Component Value Date   HCT 37.0 08/24/2019   Lab Results  Component Value Date   IRON 66 08/24/2019   TIBC 360 08/24/2019   FERRITIN 35 08/24/2019   Attestation Statements:   Reviewed by clinician on day of visit: allergies, medications, problem list, medical history, surgical history, family history, social history, and previous encounter notes.  I, 13/08/2019, CMA, am acting as Insurance claims handler for Energy manager, DO.  I have reviewed the above documentation for accuracy and completeness, and I agree with the above. [employer], DO

## 2020-01-03 ENCOUNTER — Encounter: Payer: Self-pay | Admitting: Physical Therapy

## 2020-01-03 ENCOUNTER — Ambulatory Visit: Payer: BC Managed Care – PPO | Admitting: Physical Therapy

## 2020-01-03 ENCOUNTER — Encounter (INDEPENDENT_AMBULATORY_CARE_PROVIDER_SITE_OTHER): Payer: Self-pay | Admitting: Family Medicine

## 2020-01-03 DIAGNOSIS — M62830 Muscle spasm of back: Secondary | ICD-10-CM

## 2020-01-03 DIAGNOSIS — R293 Abnormal posture: Secondary | ICD-10-CM

## 2020-01-03 LAB — CBC WITH DIFFERENTIAL/PLATELET
Basophils Absolute: 0.1 10*3/uL (ref 0.0–0.2)
Basos: 1 %
EOS (ABSOLUTE): 0.2 10*3/uL (ref 0.0–0.4)
Eos: 3 %
Hemoglobin: 12.4 g/dL (ref 11.1–15.9)
Immature Grans (Abs): 0 10*3/uL (ref 0.0–0.1)
Immature Granulocytes: 1 %
Lymphocytes Absolute: 2.8 10*3/uL (ref 0.7–3.1)
Lymphs: 44 %
MCH: 27.9 pg (ref 26.6–33.0)
MCHC: 31.2 g/dL — ABNORMAL LOW (ref 31.5–35.7)
MCV: 89 fL (ref 79–97)
Monocytes Absolute: 0.4 10*3/uL (ref 0.1–0.9)
Monocytes: 6 %
Neutrophils Absolute: 3 10*3/uL (ref 1.4–7.0)
Neutrophils: 45 %
Platelets: 327 10*3/uL (ref 150–450)
RBC: 4.44 x10E6/uL (ref 3.77–5.28)
RDW: 12.6 % (ref 11.7–15.4)
WBC: 6.4 10*3/uL (ref 3.4–10.8)

## 2020-01-03 LAB — ANEMIA PANEL
Ferritin: 25 ng/mL (ref 15–150)
Folate, Hemolysate: 397 ng/mL
Folate, RBC: 1000 ng/mL (ref >498)
Hematocrit: 39.7 % (ref 34.0–46.6)
Iron Saturation: 16 % (ref 15–55)
Iron: 59 ug/dL (ref 27–159)
Retic Ct Pct: 1.9 % (ref 0.6–2.6)
Total Iron Binding Capacity: 374 ug/dL (ref 250–450)
UIBC: 315 ug/dL (ref 131–425)
Vitamin B-12: 360 pg/mL (ref 232–1245)

## 2020-01-03 LAB — VITAMIN D 25 HYDROXY (VIT D DEFICIENCY, FRACTURES): Vit D, 25-Hydroxy: 38.9 ng/mL (ref 30.0–100.0)

## 2020-01-03 LAB — LIPID PANEL
Chol/HDL Ratio: 2.7 ratio (ref 0.0–4.4)
Cholesterol, Total: 210 mg/dL — ABNORMAL HIGH (ref 100–199)
HDL: 79 mg/dL (ref >39)
LDL Chol Calc (NIH): 107 mg/dL — ABNORMAL HIGH (ref 0–99)
Triglycerides: 137 mg/dL (ref 0–149)
VLDL Cholesterol Cal: 24 mg/dL (ref 5–40)

## 2020-01-03 LAB — COMPREHENSIVE METABOLIC PANEL
ALT: 12 IU/L (ref 0–32)
AST: 14 IU/L (ref 0–40)
Albumin/Globulin Ratio: 1.3 (ref 1.2–2.2)
Albumin: 3.9 g/dL (ref 3.9–5.0)
Alkaline Phosphatase: 82 IU/L (ref 39–117)
BUN/Creatinine Ratio: 17 (ref 9–23)
BUN: 12 mg/dL (ref 6–20)
Bilirubin Total: <0.2 mg/dL (ref 0.0–1.2)
CO2: 20 mmol/L (ref 20–29)
Calcium: 9.4 mg/dL (ref 8.7–10.2)
Chloride: 104 mmol/L (ref 96–106)
Creatinine, Ser: 0.69 mg/dL (ref 0.57–1.00)
GFR calc Af Amer: 140 mL/min/{1.73_m2} (ref >59)
GFR calc non Af Amer: 121 mL/min/{1.73_m2} (ref >59)
Globulin, Total: 2.9 g/dL (ref 1.5–4.5)
Glucose: 81 mg/dL (ref 65–99)
Potassium: 4.2 mmol/L (ref 3.5–5.2)
Sodium: 141 mmol/L (ref 134–144)
Total Protein: 6.8 g/dL (ref 6.0–8.5)

## 2020-01-03 LAB — INSULIN, RANDOM: INSULIN: 23.6 u[IU]/mL (ref 2.6–24.9)

## 2020-01-03 NOTE — Telephone Encounter (Signed)
Please advise. Thanks.  

## 2020-01-03 NOTE — Therapy (Signed)
[place], [place], [address] Phone: [phone]   Fax:  [phone]  Physical Therapy Treatment  Patient Details  Name: Ashley Acevedo MRN: [id number] Date of Birth: [birth year] Referring Provider (PT): Gregor Hams, MD   Encounter Date: 01/03/2020  PT End of Session - 01/03/20 0837    Visit Number  3    Number of Visits  13    Date for PT Re-Evaluation  01/27/20    Authorization Type  [employer] 30VL    PT Start Time  0830    PT Stop Time  0925    PT Time Calculation (min)  55 min    Activity Tolerance  Patient tolerated treatment well    Behavior During Therapy  [facility] for tasks assessed/performed       Past Medical History:  Diagnosis Date  . Allergies   . Anxiety   . Asthma   . Back pain   . GERD (gastroesophageal reflux disease)   . Heartburn   . History of stomach ulcers   . Hyperlipidemia   . Joint pain     Past Surgical History:  Procedure Laterality Date  . WISDOM TOOTH EXTRACTION      There were no vitals filed for this visit.  Subjective Assessment - 01/03/20 0835    Subjective  Pain continues to bother her.  R sided mid to low back pain.    Currently in Pain?  Yes    Pain Score  3     Pain Location  Back    Pain Orientation  Right;Mid;Lower    Pain Descriptors / Indicators  Discomfort    Pain Type  Chronic pain    Pain Onset  More than a month ago    Pain Frequency  Intermittent    Aggravating Factors   stress, sitting    Pain Relieving Factors  TENS, heat, exercises are not really changing much        [facility] Adult PT Treatment/Exercise - 01/03/20 0001      Lumbar Exercises: Quadruped   Other Quadruped Lumbar Exercises  Used Reformer in standing for spine mobility, decompression added rotation (long box 1 [place] )       Shoulder Exercises: Sidelying   Other Sidelying Exercises  trunk stretching Rt side for 1 min       Shoulder Exercises: Standing   Horizontal  ABduction  Strengthening;Both;20 reps;Theraband    Theraband Level (Shoulder Horizontal ABduction)  Level 2 (Red);Level 3 (Green)    External Rotation  Strengthening;Both;20 reps    Theraband Level (Shoulder External Rotation)  Level 3 (Green)    Flexion  Strengthening;Both;15 reps    Theraband Level (Shoulder Flexion)  Level 3 (Green)    Diagonals  Strengthening;Both;10 reps    Theraband Level (Shoulder Diagonals)  Level 3 (Green)      Shoulder Exercises: Stretch   Other Shoulder Stretches  cat-cow- childs pose to thread the needline     Other Shoulder Stretches  upper trunk rotation x 5 each side , less restrictee      Modalities   Modalities  Moist Heat      Moist Heat Therapy   Number Minutes Moist Heat  10 Minutes    Moist Heat Location  Other (comment)   TL spine R     Manual Therapy   Joint Mobilization  PA thoracic spine, and ribs costovertebral  PT Short Term Goals - 01/03/20 0916      PT SHORT TERM GOAL #1   Title  verbalize use & understanding of exercises during her day    Status  On-going        PT Long Term Goals - 01/03/20 0916      PT LONG TERM GOAL #1   Title  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 computer using exercises    Status  On-going      PT LONG TERM GOAL #2   Title  demo improved posture while seated at computer    Status  On-going      PT LONG TERM GOAL #3   Title  reduction in right elbow pain    Status  On-going      PT LONG TERM GOAL #4   Title  able to sleep on Rt side    Status  On-going            Plan - 01/03/20 0916    Clinical Impression Statement  Patient has been more consistent with exercises.  Able to perform exercises in standing against wall for more core challenge and postural awareness.  Add green band for more resistance.  Cont to encourage spinal mobility.    PT Treatment/Interventions  ADLs/Self Care Home Management;Cryotherapy;Electrical Stimulation;Functional mobility  training;Ultrasound;Traction;Moist Heat;Therapeutic activities;Therapeutic exercise;Neuromuscular re-education;Patient/family education;Passive range of motion;Manual techniques;Dry needling;Taping;Spinal Manipulations;Joint Manipulations    PT Next Visit Plan  Core strengthing, UE strength and thoracic mobs    PT Home Exercise Plan  cat-camel-child pose, seated extension, seated sidebend, GHJ ER red tband, supine OH band pull, qped thoracic rotation    Consulted and Agree with Plan of Care  Patient       Patient will benefit from skilled therapeutic intervention in order to improve the following deficits and impairments:  Increased muscle spasms, Decreased activity tolerance, Pain, Improper body mechanics, Postural dysfunction  Visit Diagnosis: Muscle spasm of back  Abnormal posture     Problem List Patient Active Problem List   Diagnosis Date Noted  . Mild persistent asthma without complication 11/02/2017  . Seasonal allergic conjunctivitis 11/02/2017  . Anxiety 04/10/2016    Ashley Acevedo 01/03/2020, 9:20 AM  [facility] [address] [place], [place], [address] Phone: [phone]   Fax:  [phone]  Name: Ashley Acevedo MRN: [id number] Date of Birth: 02/06/1996   Karie Mainland, PT 01/03/20 9:20 AM Phone: [phone] Fax: [phone]

## 2020-01-06 ENCOUNTER — Ambulatory Visit: Payer: BC Managed Care – PPO | Admitting: Physical Therapy

## 2020-01-06 ENCOUNTER — Encounter: Payer: Self-pay | Admitting: Physical Therapy

## 2020-01-06 ENCOUNTER — Other Ambulatory Visit: Payer: Self-pay

## 2020-01-06 DIAGNOSIS — R293 Abnormal posture: Secondary | ICD-10-CM

## 2020-01-06 DIAGNOSIS — M62830 Muscle spasm of back: Secondary | ICD-10-CM | POA: Diagnosis not present

## 2020-01-06 NOTE — Therapy (Signed)
[facility] [address] [place], [place], [address] Phone: [phone]   Fax:  [phone]  Physical Therapy Treatment  Patient Details  Name: Ashley Acevedo MRN: [id number] Date of Birth: 1995-12-14 Referring Provider (PT): Rodolph Bong, MD   Encounter Date: 01/06/2020  PT End of Session - 01/06/20 0719    Visit Number  4    Number of Visits  13    Date for PT Re-Evaluation  01/27/20    Authorization Type  BCBS 30VL    PT Start Time  0715    PT Stop Time  0750    PT Time Calculation (min)  35 min    Activity Tolerance  Patient tolerated treatment well    Behavior During Therapy  [facility] for tasks assessed/performed       Past Medical History:  Diagnosis Date  . Allergies   . Anxiety   . Asthma   . Back pain   . GERD (gastroesophageal reflux disease)   . Heartburn   . History of stomach ulcers   . Hyperlipidemia   . Joint pain     Past Surgical History:  Procedure Laterality Date  . WISDOM TOOTH EXTRACTION      There were no vitals filed for this visit.  Subjective Assessment - 01/06/20 0717    Subjective  I still feel it, maybe less frequent. Sometimes pain with taking a deep breath.    Currently in Pain?  No/denies                       [facility] Adult PT Treatment/Exercise - 01/06/20 0001      Lumbar Exercises: Stretches   Other Lumbar Stretch Exercise  child pose    Other Lumbar Stretch Exercise  standing door stretch QL      Lumbar Exercises: Supine   Dead Bug Limitations  beganwith feet on table, then lifted to 90    Isometric Hip Flexion Limitations  alternating with post pelvic tilt      Lumbar Exercises: Quadruped   Opposite Arm/Leg Raise Limitations  x10 each             PT Education - 01/06/20 0757    Education Details  TPDN & expected outcomes, exercise form/rationale, importance of regular movement, standing desk    Person(s) Educated  Patient    Methods   Explanation;Handout    Comprehension  Verbalized understanding;Need further instruction       PT Short Term Goals - 01/06/20 0757      PT SHORT TERM GOAL #1   Title  verbalize use & understanding of exercises during her day    Status  Achieved        PT Long Term Goals - 01/03/20 0916      PT LONG TERM GOAL #1   Title  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 computer using exercises    Status  On-going      PT LONG TERM GOAL #2   Title  demo improved posture while seated at computer    Status  On-going      PT LONG TERM GOAL #3   Title  reduction in right elbow pain    Status  On-going      PT LONG TERM GOAL #4   Title  able to sleep on Rt side    Status  On-going            Plan -  01/06/20 0754    Clinical Impression Statement  DN to intercostals and Rt lats reduced intensity of stretch but still felt it. Added core strengthening to HEP. Will continue DN next visit and continue with reformer.    PT Treatment/Interventions  ADLs/Self Care Home Management;Cryotherapy;Electrical Stimulation;Functional mobility training;Ultrasound;Traction;Moist Heat;Therapeutic activities;Therapeutic exercise;Neuromuscular re-education;Patient/family education;Passive range of motion;Manual techniques;Dry needling;Taping;Spinal Manipulations;Joint Manipulations    PT Next Visit Plan  continue DN, reformer    PT Home Exercise Plan  cat-camel-child pose, seated extension, seated sidebend, GHJ ER red tband, supine OH band pull, qped thoracic rotation, dead bug, bird dog    Consulted and Agree with Plan of Care  Patient       Patient will benefit from skilled therapeutic intervention in order to improve the following deficits and impairments:  Increased muscle spasms, Decreased activity tolerance, Pain, Improper body mechanics, Postural dysfunction  Visit Diagnosis: Muscle spasm of back  Abnormal posture     Problem List Patient Active Problem List   Diagnosis Date Noted  . Mild  persistent asthma without complication 86/75/4492  . Seasonal allergic conjunctivitis 11/02/2017  . Anxiety 04/10/2016    Ashley Acevedo C. Ashley Acevedo PT, DPT 01/06/20 7:59 AM   [facility] [address] [place], [place], [address] Phone: [phone]   Fax:  [phone]  Name: Ashley Acevedo MRN: [id number] Date of Birth: 1995/11/10

## 2020-01-09 ENCOUNTER — Ambulatory Visit: Payer: BC Managed Care – PPO | Admitting: Family Medicine

## 2020-01-10 ENCOUNTER — Ambulatory Visit: Payer: BC Managed Care – PPO | Admitting: Physical Therapy

## 2020-01-10 ENCOUNTER — Other Ambulatory Visit: Payer: Self-pay

## 2020-01-10 ENCOUNTER — Encounter: Payer: Self-pay | Admitting: Physical Therapy

## 2020-01-10 DIAGNOSIS — R293 Abnormal posture: Secondary | ICD-10-CM | POA: Diagnosis not present

## 2020-01-10 DIAGNOSIS — M62830 Muscle spasm of back: Secondary | ICD-10-CM | POA: Diagnosis not present

## 2020-01-10 NOTE — Therapy (Signed)
[place], [place], [address] Phone: [phone]   Fax:  [phone]  Physical Therapy Treatment  Patient Details  Name: Ashley Acevedo MRN: [id number] Date of Birth: 05/12/1996 Referring Provider (PT): Gregor Hams, MD   Encounter Date: 01/10/2020  PT End of Session - 01/10/20 1632    Visit Number  5    Number of Visits  13    Date for PT Re-Evaluation  01/27/20    Authorization Type  BCBS 30VL    PT Start Time  1632    PT Stop Time  1711    PT Time Calculation (min)  39 min    Activity Tolerance  Patient tolerated treatment well    Behavior During Therapy  [facility] for tasks assessed/performed       Past Medical History:  Diagnosis Date  . Allergies   . Anxiety   . Asthma   . Back pain   . GERD (gastroesophageal reflux disease)   . Heartburn   . History of stomach ulcers   . Hyperlipidemia   . Joint pain     Past Surgical History:  Procedure Laterality Date  . WISDOM TOOTH EXTRACTION      There were no vitals filed for this visit.  Subjective Assessment - 01/10/20 1634    Subjective  "I was sore after needling, but it was helpful." Patient reports improvements in pain and functionality. She also indicated that stretching helps.    Pain Score  2     Pain Location  Back    Pain Orientation  Right;Mid;Lower    Pain Descriptors / Indicators  Discomfort    Pain Type  Chronic pain    Pain Onset  More than a month ago    Pain Frequency  Intermittent    Aggravating Factors   stress, sitting    Pain Relieving Factors  TENS, heat, DN, Stretching    Multiple Pain Sites  No                       [facility] Adult PT Treatment/Exercise - 01/10/20 0001      Pilates   Pilates Reformer  Side planks. UE extension      Lumbar Exercises: Stretches   Other Lumbar Stretch Exercise  child pose, walk hands over to the left    Other Lumbar Stretch Exercise  Thread the needle       Lumbar  Exercises: Supine   Dead Bug  10 reps    Dead Bug Limitations  --      Lumbar Exercises: Quadruped   Opposite Arm/Leg Raise  10 reps;Right arm/Left leg;Left arm/Right leg    Opposite Arm/Leg Raise Limitations  --       Trigger Point Dry Needling - 01/10/20 0001    Consent Given?  Yes    Education Handout Provided  Previously provided    Muscles Treated Upper Quadrant  Latissimus dorsi    Latissimus dorsi Response  Palpable increased muscle length;Twitch response elicited             PT Short Term Goals - 01/06/20 0757      PT SHORT TERM GOAL #1   Title  verbalize use & understanding of exercises during her day    Status  Achieved        PT Long Term Goals - 01/03/20 0916      PT LONG TERM GOAL #1   Title  will  be able to tolerate work day on computer using exercises    Status  On-going      PT LONG TERM GOAL #2   Title  demo improved posture while seated at computer    Status  On-going      PT LONG TERM GOAL #3   Title  reduction in right elbow pain    Status  On-going      PT LONG TERM GOAL #4   Title  able to sleep on Rt side    Status  On-going            Plan - 01/10/20 1716    Clinical Impression Statement  Muscle lengthening and a decrease in pain was reported with DN. The paient responded favorably to stretching especially when lengthening the Rt. side on the reformer. Patient would benefit from PT in order to further address pain and core strengthening.    Personal Factors and Comorbidities  Comorbidity 1    Comorbidities  Weight    Examination-Activity Limitations  Reach Overhead;Bed Mobility;Sleep;Bend;Sit;Stand;Lift    Examination-Participation Restrictions  Other    Stability/Clinical Decision Making  Stable/Uncomplicated    Clinical Decision Making  Low    Rehab Potential  Good    PT Frequency  2x / week    PT Duration  6 weeks    PT Treatment/Interventions  ADLs/Self Care Home Management;Cryotherapy;Electrical Stimulation;Functional  mobility training;Ultrasound;Traction;Moist Heat;Therapeutic activities;Therapeutic exercise;Neuromuscular re-education;Patient/family education;Passive range of motion;Manual techniques;Dry needling;Taping;Spinal Manipulations;Joint Manipulations    PT Next Visit Plan  continue DN, reformer    PT Home Exercise Plan  cat-camel-child pose, seated extension, seated sidebend, GHJ ER red tband, supine OH band pull, qped thoracic rotation, dead bug, bird dog    Consulted and Agree with Plan of Care  Patient       Patient will benefit from skilled therapeutic intervention in order to improve the following deficits and impairments:  Increased muscle spasms, Decreased activity tolerance, Pain, Improper body mechanics, Postural dysfunction  Visit Diagnosis: Muscle spasm of back  Abnormal posture     Problem List Patient Active Problem List   Diagnosis Date Noted  . Mild persistent asthma without complication 11/02/2017  . Seasonal allergic conjunctivitis 11/02/2017  . Anxiety 04/10/2016    Cato Mulligan, SPT 01/10/2020, 6:07 PM  [facility] [address] [place], [place], [address] Phone: [phone]   Fax:  [phone]  Name: Ashley Acevedo MRN: [id number] Date of Birth: 04/12/1996

## 2020-01-12 ENCOUNTER — Other Ambulatory Visit: Payer: Self-pay

## 2020-01-12 ENCOUNTER — Ambulatory Visit: Payer: BC Managed Care – PPO | Attending: Family Medicine | Admitting: Physical Therapy

## 2020-01-12 ENCOUNTER — Encounter: Payer: Self-pay | Admitting: Physical Therapy

## 2020-01-12 DIAGNOSIS — M62830 Muscle spasm of back: Secondary | ICD-10-CM

## 2020-01-12 DIAGNOSIS — R293 Abnormal posture: Secondary | ICD-10-CM | POA: Diagnosis not present

## 2020-01-12 NOTE — Therapy (Signed)
[place], [place], [address] Phone: [phone]   Fax:  [phone]  Physical Therapy Treatment  Patient Details  Name: Ashley Acevedo MRN: [id number] Date of Birth: 10-15-1995 Referring Provider (PT): Gregor Hams, MD   Encounter Date: 01/12/2020  PT End of Session - 01/12/20 1757    Visit Number  6    Number of Visits  13    Date for PT Re-Evaluation  01/27/20    Authorization Type  BCBS 30VL    PT Start Time  9381    PT Stop Time  1755    PT Time Calculation (min)  40 min    Activity Tolerance  Patient tolerated treatment well    Behavior During Therapy  [facility] for tasks assessed/performed       Past Medical History:  Diagnosis Date  . Allergies   . Anxiety   . Asthma   . Back pain   . GERD (gastroesophageal reflux disease)   . Heartburn   . History of stomach ulcers   . Hyperlipidemia   . Joint pain     Past Surgical History:  Procedure Laterality Date  . WISDOM TOOTH EXTRACTION      There were no vitals filed for this visit.  Subjective Assessment - 01/12/20 1752    Subjective  Patient reports that she was sore after working on the reformer, but she likes it. Pointed along her lats ant into medial arm describing soreness. She now feels a spot along Lt scapular border that she didn't notice before.    Pain Score  0-No pain    Pain Location  Back    Pain Orientation  Right;Mid;Lower    Pain Descriptors / Indicators  Discomfort    Pain Type  Chronic pain    Pain Onset  More than a month ago    Pain Frequency  Intermittent    Aggravating Factors   stress, sitting    Pain Relieving Factors  TENS, heat, DN, stretching    Multiple Pain Sites  No         [facility] PT Assessment - 01/12/20 0001      Posture/Postural Control   Posture Comments  demo good resting scapular retraction                   [facility] Adult PT Treatment/Exercise - 01/12/20 0001      Pilates   Pilates  Reformer  see PT note      Lumbar Exercises: Stretches   Other Lumbar Stretch Exercise  Open book      Manual Therapy   Manual therapy comments  discussed theracane and demonstrated    Joint Mobilization  PA thoracic spine, and ribs costovertebral    cavitations noted   Soft tissue mobilization  rhomboids, middle traps         Pilates Reformer used for LE/core strength, postural strength, lumbopelvic disassociation and core control.  Exercises included: Arm circles in side lying,  Prone tricep pulls &straight arm press, Supine straight arm press & circles        PT Short Term Goals - 01/06/20 0757      PT SHORT TERM GOAL #1   Title  verbalize use & understanding of exercises during her day    Status  Achieved        PT Long Term Goals - 01/03/20 0916      PT LONG TERM GOAL #1   Title  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 computer using exercises    Status  On-going      PT LONG TERM GOAL #2   Title  demo improved posture while seated at computer    Status  On-going      PT LONG TERM GOAL #3   Title  reduction in right elbow pain    Status  On-going      PT LONG TERM GOAL #4   Title  able to sleep on Rt side    Status  On-going            Plan - 01/12/20 1746    Clinical Impression Statement  Patient did well on the reformer. She requested manual therapy as she found it to be beneficial in previous sessions.She reports that she feels better after manual therapy was performed.    PT Treatment/Interventions  ADLs/Self Care Home Management;Cryotherapy;Electrical Stimulation;Functional mobility training;Ultrasound;Traction;Moist Heat;Therapeutic activities;Therapeutic exercise;Neuromuscular re-education;Patient/family education;Passive range of motion;Manual techniques;Dry needling;Taping;Spinal Manipulations;Joint Manipulations    PT Next Visit Plan  continue DN, reformer    PT Home Exercise Plan  cat-camel-child pose, seated extension, seated sidebend, GHJ  ER red tband, supine OH band pull, qped thoracic rotation, dead bug, bird dog       Patient will benefit from skilled therapeutic intervention in order to improve the following deficits and impairments:  Increased muscle spasms, Decreased activity tolerance, Pain, Improper body mechanics, Postural dysfunction  Visit Diagnosis: Muscle spasm of back  Abnormal posture     Problem List Patient Active Problem List   Diagnosis Date Noted  . Mild persistent asthma without complication 11/02/2017  . Seasonal allergic conjunctivitis 11/02/2017  . Anxiety 04/10/2016    Birdell Frasier C. Jessie Schrieber PT, DPT 01/12/20 8:15 PM   [facility] [address] [place], [place], [address] Phone: [phone]   Fax:  [phone]  Name: Ashley Acevedo MRN: [id number] Date of Birth: 10-16-1995

## 2020-01-17 ENCOUNTER — Other Ambulatory Visit: Payer: Self-pay

## 2020-01-17 ENCOUNTER — Ambulatory Visit: Payer: BC Managed Care – PPO | Admitting: Physical Therapy

## 2020-01-17 DIAGNOSIS — M62830 Muscle spasm of back: Secondary | ICD-10-CM | POA: Diagnosis not present

## 2020-01-17 DIAGNOSIS — R293 Abnormal posture: Secondary | ICD-10-CM | POA: Diagnosis not present

## 2020-01-17 NOTE — Therapy (Signed)
[facility] [address] [place], [place], [address] Phone: [phone]   Fax:  [phone]  Physical Therapy Treatment  Patient Details  Name: Ashley Acevedo MRN: [id number] Date of Birth: 04-25-1996 Referring Provider (PT): Rodolph Bong, MD   Encounter Date: 01/17/2020  PT End of Session - 01/17/20 1712    Visit Number  7    Number of Visits  13    Date for PT Re-Evaluation  01/27/20    Authorization Type  BCBS 30VL    PT Start Time  1658    PT Stop Time  1745    PT Time Calculation (min)  47 min    Activity Tolerance  Patient tolerated treatment well    Behavior During Therapy  [facility] for tasks assessed/performed       Past Medical History:  Diagnosis Date  . Allergies   . Anxiety   . Asthma   . Back pain   . GERD (gastroesophageal reflux disease)   . Heartburn   . History of stomach ulcers   . Hyperlipidemia   . Joint pain     Past Surgical History:  Procedure Laterality Date  . WISDOM TOOTH EXTRACTION      There were no vitals filed for this visit.  Subjective Assessment - 01/17/20 1701    Subjective  Uses TENs every other day. Continues to have Rt sided back pain, central low back. Rates as 3/10.    Currently in Pain?  Yes    Pain Score  4     Pain Location  Back    Pain Orientation  Right;Mid;Lower    Pain Descriptors / Indicators  Discomfort    Pain Type  Chronic pain    Pain Onset  More than a month ago    Pain Frequency  Intermittent         [facility] Adult PT Treatment/Exercise - 01/17/20 0001      Pilates   Pilates Tower  see note       Lumbar Exercises: Quadruped   Other Quadruped Lumbar Exercises  childs pose end of session     Other Quadruped Lumbar Exercises  prone scapular mobility       Manual Therapy   Joint Mobilization  PA thoracic and unilateral for rotation    Gr III-IV        Pilates Tower for LE/Core strength, postural strength, lumbopelvic disassociation and core control.   Exercises included:  [place] shoulder extension 2 x 10 added knee lift 2nd set  Abduction x 10   Cat Kneeling/Seated with push through bar: x 8 added rotation   Bridging x 10, added arm springs x 10, cues for full elbow extension and grip   Lower trunk rotation in between sets   Sidefacing 1 Red overhead press x 10, then pull down x 10 , not difficult for her but lacks focus on lats  Door handle for rhomboid stretch x 3     Arc stretching for Rt side x 1 min added slight rotation  Then did L side as well for comparison  PT Education - 01/17/20 1717    Education Details  scoliosis, core and lengthening stretches    Person(s) Educated  Patient    Methods  Explanation;Demonstration    Comprehension  Verbalized understanding;Need further instruction;Returned demonstration       PT Short Term Goals - 01/06/20 0757      PT SHORT TERM GOAL #1   Title  verbalize use & understanding of exercises during her day    Status  Achieved        PT Long Term Goals - 01/17/20 1734      PT LONG TERM GOAL #1   Title  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 computer using exercises    Baseline  gets up and does her stretching at the chair    Status  On-going      PT LONG TERM GOAL #2   Title  demo improved posture while seated at computer    Baseline  making changes for ergonomics, not an option for a standing desk    Status  On-going      PT [place] #3   Title  reduction in right elbow pain    Baseline  more in forearm, old injury in high school (elbow)    Status  On-going      PT LONG TERM GOAL #4   Title  able to sleep on Rt side    Baseline  can lay on it but wakes up on the Rt side , mild improved since PT    Status  On-going            Plan - 01/17/20 1751    Clinical Impression Statement  Worked on tractioning and lengthening stretches to relieve thoracic stiffness.  She is making minimal progress and is being proactive with her workstation, taking frequent  breaks and doing her exercises.    PT Treatment/Interventions  ADLs/Self Care Home Management;Cryotherapy;Electrical Stimulation;Functional mobility training;Ultrasound;Traction;Moist Heat;Therapeutic activities;Therapeutic exercise;Neuromuscular re-education;Patient/family education;Passive range of motion;Manual techniques;Dry needling;Taping;Spinal Manipulations;Joint Manipulations    PT Next Visit Plan  continue DN, quadruped for core and full body integration    PT Home Exercise Plan  cat-camel-child pose, seated extension, seated sidebend, GHJ ER red tband, supine OH band pull, qped thoracic rotation, dead bug, bird dog    Consulted and Agree with Plan of Care  Patient       Patient will benefit from skilled therapeutic intervention in order to improve the following deficits and impairments:  Increased muscle spasms, Decreased activity tolerance, Pain, Improper body mechanics, Postural dysfunction  Visit Diagnosis: Muscle spasm of back  Abnormal posture     Problem List Patient Active Problem List   Diagnosis Date Noted  . Mild persistent asthma without complication 78/24/2353  . Seasonal allergic conjunctivitis 11/02/2017  . Anxiety 04/10/2016    Marqui Formby 01/17/2020, 5:54 PM  [facility] [address] [place], [place], [address] Phone: [phone]   Fax:  [phone]  Name: PASCUALA KLUTTS MRN: [id number] Date of Birth: [birth year]  Raeford Razor, PT 01/17/20 5:54 PM Phone: [phone] Fax: [phone]

## 2020-01-19 ENCOUNTER — Ambulatory Visit: Payer: BC Managed Care – PPO | Admitting: Physical Therapy

## 2020-01-23 ENCOUNTER — Encounter (INDEPENDENT_AMBULATORY_CARE_PROVIDER_SITE_OTHER): Payer: Self-pay | Admitting: Family Medicine

## 2020-01-23 ENCOUNTER — Other Ambulatory Visit: Payer: Self-pay

## 2020-01-23 ENCOUNTER — Ambulatory Visit (INDEPENDENT_AMBULATORY_CARE_PROVIDER_SITE_OTHER): Payer: BC Managed Care – PPO | Admitting: Family Medicine

## 2020-01-23 VITALS — BP 125/78 | HR 94 | Temp 98.0°F | Ht 68.0 in | Wt 292.0 lb

## 2020-01-23 DIAGNOSIS — R790 Abnormal level of blood mineral: Secondary | ICD-10-CM | POA: Diagnosis not present

## 2020-01-23 DIAGNOSIS — E559 Vitamin D deficiency, unspecified: Secondary | ICD-10-CM

## 2020-01-23 DIAGNOSIS — E538 Deficiency of other specified B group vitamins: Secondary | ICD-10-CM | POA: Diagnosis not present

## 2020-01-23 DIAGNOSIS — F3289 Other specified depressive episodes: Secondary | ICD-10-CM

## 2020-01-23 DIAGNOSIS — F5102 Adjustment insomnia: Secondary | ICD-10-CM | POA: Insufficient documentation

## 2020-01-23 DIAGNOSIS — Z9189 Other specified personal risk factors, not elsewhere classified: Secondary | ICD-10-CM

## 2020-01-23 DIAGNOSIS — F329 Major depressive disorder, single episode, unspecified: Secondary | ICD-10-CM | POA: Insufficient documentation

## 2020-01-23 DIAGNOSIS — E8881 Metabolic syndrome: Secondary | ICD-10-CM | POA: Diagnosis not present

## 2020-01-23 DIAGNOSIS — F32A Depression, unspecified: Secondary | ICD-10-CM

## 2020-01-23 DIAGNOSIS — E88819 Insulin resistance, unspecified: Secondary | ICD-10-CM

## 2020-01-23 DIAGNOSIS — Z6841 Body Mass Index (BMI) 40.0 and over, adult: Secondary | ICD-10-CM

## 2020-01-23 HISTORY — DX: Adjustment insomnia: F51.02

## 2020-01-23 HISTORY — DX: Deficiency of other specified B group vitamins: E53.8

## 2020-01-23 HISTORY — DX: Vitamin D deficiency, unspecified: E55.9

## 2020-01-23 HISTORY — DX: Depression, unspecified: F32.A

## 2020-01-23 NOTE — Progress Notes (Signed)
Chief Complaint:   OBESITY Ashley Acevedo is here to discuss her progress with her obesity treatment plan along with follow-up of her obesity related diagnoses. Ashley Acevedo is on the Category 3 Plan and states she is following her eating plan approximately 75% of the time. Ashley Acevedo states she is walking for 30-45 minutes 2 times per week.  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  She is doing well with meal planning during the week, but is making poor choices on the weekends.  Subjective:   1. Insulin resistance Ashley Acevedo has a diagnosis of insulin resistance based on her elevated fasting insulin level >5. Her most recent insulin level shows improvement.  Lab Results  Component Value Date   INSULIN 23.6 01/02/2020   INSULIN 27.6 (H) 08/24/2019   Lab Results  Component Value Date   HGBA1C 4.9 08/24/2019   2. Vitamin B12 deficiency This has been improving with supplementation.  Lab Results  Component Value Date   VITAMINB12 360 01/02/2020   3. Vitamin D deficiency Ashley Acevedo's Vitamin D level was 38.9 on 01/02/2020. She is currently taking prescription vitamin D 50,000 IU each week. She denies nausea, vomiting or muscle weakness.  4. Low ferritin CBC Latest Ref Rng & Units 01/02/2020 08/24/2019  WBC 3.4 - 10.8 x10E3/uL 6.4 -  Hemoglobin 11.1 - 15.9 g/dL 12.4 -  Hematocrit 34.0 - 46.6 % 39.7 37.0  Platelets 150 - 450 x10E3/uL 327 -   Lab Results  Component Value Date   IRON 59 01/02/2020   TIBC 374 01/02/2020   FERRITIN 25 01/02/2020   5. Other depression, with emotional eating Ashley Acevedo is struggling with emotional eating and using food for comfort to the extent that it is negatively impacting her health. She has been working on behavior  modification techniques to help reduce her emotional eating and has been somewhat successful. She is taking Lexapro which helps.  Assessment/Plan:   1. Insulin resistance Ashley Acevedo will continue to work on weight loss, exercise, and decreasing simple carbohydrates to help decrease the risk of diabetes. Ashley Acevedo agreed to follow-up with [place] as directed to closely monitor her progress.  We reviewed Saxenda as a medication option.  2. Vitamin B12 deficiency We will continue to monitor. Orders and follow up as documented in patient record.    Counseling . The body needs vitamin B12: to make red blood cells; to make DNA; and to help the nerves work properly so they can carry messages from the brain to the body.  . The main causes of vitamin B12 deficiency include dietary deficiency, digestive diseases, pernicious anemia, and having a surgery in which part of the stomach or small intestine is removed.  . Certain medicines can make it harder for the body to absorb vitamin B12. These medicines include: heartburn medications; some antibiotics; some medications used to treat diabetes, gout, and high cholesterol.  . In some cases, there are no symptoms of this condition. If the condition leads to anemia or nerve damage, various symptoms can occur, such as weakness or fatigue, shortness of breath, and numbness or tingling in your hands and feet.   . Treatment:  o May include taking vitamin B12 supplements.  o Avoid alcohol.  o Eat lots of healthy foods that  contain vitamin B12: - Beef, pork, chicken, [place], and organ meats, such as liver.  - Seafood: This includes clams, rainbow trout, salmon, tuna, and haddock. Eggs.  - Cereal and dairy products that are fortified: This means that vitamin B12 has been added to the food.   3. Vitamin D deficiency Low Vitamin D level contributes to fatigue and are associated with obesity, breast, and colon cancer. She agrees to continue to take prescription Vitamin D @50 ,000 IU  every week and will follow-up for routine testing of Vitamin D, at least 2-3 times per year to avoid over-replacement.  4. Low ferritin Orders and follow up as documented in patient record.  Counseling . Iron is essential for our bodies to make red blood cells.  Reasons that someone may be deficient include: an iron-deficient diet (more likely in those following vegan or vegetarian diets), women with heavy menses, patients with GI disorders or poor absorption, patients that have had bariatric surgery, frequent blood donors, patients with cancer, and patients with heart disease.   An iron supplement has been recommended. This is found over-the-counter. We recommend: Every other day: Slow Fe 45mg  Iron Supplement for Iron Deficiency, Slow Release, High Potency, Easy to Swallow Tablets.  Marland Kitchen foods include dark leafy greens, red and white meats, eggs, seafood, and beans.   . Certain foods and drinks prevent your body from absorbing iron properly. Avoid eating these foods in the same meal as iron-rich foods or with iron supplements. These foods include: coffee, black tea, and red wine; milk, dairy products, and foods that are high in calcium; beans and soybeans; whole grains.  . Constipation can be a side effect of iron supplementation. Increased water and fiber intake are helpful. Water goal: > 2 liters/day. Fiber goal: > 25 grams/day.  5. Other depression, with emotional eating Behavior modification techniques were discussed today to help Ashley Acevedo deal with her emotional/non-hunger eating behaviors.  Orders and follow up as documented in patient record.   6. At risk for hyperglycemia Ashley Acevedo was given approximately 15 minutes of counseling today regarding prevention of hyperglycemia. She was advised of hyperglycemia causes and the fact hyperglycemia is often asymptomatic. Ashley Acevedo was instructed to avoid skipping meals, eat regular protein rich meals and schedule low calorie but protein rich snacks as  needed.   Repetitive spaced learning was employed today to elicit superior memory formation and behavioral change  7. Class 3 severe obesity with serious comorbidity and body mass index (BMI) of 40.0 to 44.9 in adult, unspecified obesity type Ashley Acevedo is currently in the action stage of change. As such, her goal is to continue with weight loss efforts. She has agreed to the Category 3 Plan.   Exercise goals: For substantial health benefits, adults should do at least 150 minutes (2 hours and 30 minutes) a week of moderate-intensity, or 75 minutes (1 hour and 15 minutes) a week of vigorous-intensity aerobic physical activity, or an equivalent combination of moderate- and vigorous-intensity aerobic activity. Aerobic activity should be performed in episodes of at least 10 minutes, and preferably, it should be spread throughout the week.  Behavioral modification strategies: decreasing simple carbohydrates and keeping a strict food journal.  Ashley Acevedo has agreed to follow-up with our clinic in 2 weeks. She was informed of the importance of frequent follow-up visits to maximize her success with intensive lifestyle modifications for her multiple health conditions.   Objective:   Blood pressure 125/78, pulse 94, temperature 98 F (36.7 C), temperature source Oral, height  5\' 8"  (1.727 m), weight 292 lb (132.5 kg), last menstrual period 12/27/2019, SpO2 96 %. Body mass index is 44.4 kg/m.  General: Cooperative, alert, well developed, in no acute distress. HEENT: Conjunctivae and lids unremarkable. Cardiovascular: Regular rhythm.  Lungs: Normal work of breathing. Neurologic: No focal deficits.   Lab Results  Component Value Date   CREATININE 0.69 01/02/2020   BUN 12 01/02/2020   NA 141 01/02/2020   K 4.2 01/02/2020   CL 104 01/02/2020   CO2 20 01/02/2020   Lab Results  Component Value Date   ALT 12 01/02/2020   AST 14 01/02/2020   ALKPHOS 82 01/02/2020   BILITOT <0.2 01/02/2020   Lab  Results  Component Value Date   HGBA1C 4.9 08/24/2019   Lab Results  Component Value Date   INSULIN 23.6 01/02/2020   INSULIN 27.6 (H) 08/24/2019   Lab Results  Component Value Date   CHOL 210 (H) 01/02/2020   HDL 79 01/02/2020   LDLCALC 107 (H) 01/02/2020   TRIG 137 01/02/2020   CHOLHDL 2.7 01/02/2020   Lab Results  Component Value Date   WBC 6.4 01/02/2020   HGB 12.4 01/02/2020   HCT 39.7 01/02/2020   MCV 89 01/02/2020   PLT 327 01/02/2020   Lab Results  Component Value Date   IRON 59 01/02/2020   TIBC 374 01/02/2020   FERRITIN 25 01/02/2020   Attestation Statements:   Reviewed by clinician on day of visit: allergies, medications, problem list, medical history, surgical history, family history, social history, and previous encounter notes.  I, 01/04/2020, CMA, am acting as Insurance claims handler for Energy manager, DO.  I have reviewed the above documentation for accuracy and completeness, and I agree with the above. [employer], DO

## 2020-01-24 ENCOUNTER — Ambulatory Visit: Payer: BC Managed Care – PPO | Admitting: Physical Therapy

## 2020-01-24 ENCOUNTER — Encounter: Payer: Self-pay | Admitting: Physical Therapy

## 2020-01-24 DIAGNOSIS — R293 Abnormal posture: Secondary | ICD-10-CM

## 2020-01-24 DIAGNOSIS — M62830 Muscle spasm of back: Secondary | ICD-10-CM

## 2020-01-24 NOTE — Therapy (Signed)
[facility] Outpatient [facility] [address] [place], [place], [address] Phone: [phone]   Fax:  [phone]  Physical Therapy Treatment  Patient Details  Name: Ashley Acevedo MRN: [id number] Date of Birth: Aug 22, 1996 Referring Provider (PT): Rodolph Bong, MD   Encounter Date: 01/24/2020  PT End of Session - 01/24/20 1717    Visit Number  8    Number of Visits  13    Date for PT Re-Evaluation  02/03/20    Authorization Type  BCBS 30VL    PT Start Time  1713    PT Stop Time  1744    PT Time Calculation (min)  31 min    Activity Tolerance  Patient tolerated treatment well    Behavior During Therapy  [facility] for tasks assessed/performed       Past Medical History:  Diagnosis Date  . Allergies   . Anxiety   . Asthma   . Back pain   . GERD (gastroesophageal reflux disease)   . Heartburn   . History of stomach ulcers   . Hyperlipidemia   . Joint pain     Past Surgical History:  Procedure Laterality Date  . WISDOM TOOTH EXTRACTION      There were no vitals filed for this visit.  Subjective Assessment - 01/24/20 1716    Subjective  I can make it feel better with stretches but then it's the same when I am done sretching. Travels up above shoulder blade. better than it was.    Currently in Pain?  No/denies                       [facility] Adult PT Treatment/Exercise - 01/24/20 0001      Manual Therapy   Manual therapy comments  skilled palpation and monitoring during TPDN    Joint Mobilization  prone thoracic & rib mobs, seated rib mob on Rt with breathing, prone rt first rib mob    Soft tissue mobilization  Rt upper trap, lats & subscap       Trigger Point Dry Needling - 01/24/20 0001    Muscles Treated Head and Neck  Upper trapezius    Muscles Treated Upper Quadrant  Latissimus dorsi;Subscapularis    Other Dry Needling  Rt thoracic paraspinals    Upper Trapezius Response  Twitch reponse elicited;Palpable increased  muscle length    Subscapularis Response  Twitch response elicited;Palpable increased muscle length    Latissimus dorsi Response  Twitch response elicited;Palpable increased muscle length             PT Short Term Goals - 01/06/20 0757      PT SHORT TERM GOAL #1   Title  verbalize use & understanding of exercises during her day    Status  Achieved        PT Long Term Goals - 01/17/20 1734      PT LONG TERM GOAL #1   Title  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 computer using exercises    Baseline  gets up and does her stretching at the chair    Status  On-going      PT LONG TERM GOAL #2   Title  demo improved posture while seated at computer    Baseline  making changes for ergonomics, not an option for a standing desk    Status  On-going      PT LONG TERM GOAL #3   Title  reduction in  right elbow pain    Baseline  more in forearm, old injury in high school (elbow)    Status  On-going      PT LONG TERM GOAL #4   Title  able to sleep on Rt side    Baseline  can lay on it but wakes up on the Rt side , mild improved since PT    Status  On-going            Plan - 01/24/20 1744    Clinical Impression Statement  Dry needling to region of pain complaints- pt reported feeling as though the area of pain was more sore as would be expected following DN. Asked her to keep moving and do her stretches in about an hour.    PT Treatment/Interventions  ADLs/Self Care Home Management;Cryotherapy;Electrical Stimulation;Functional mobility training;Ultrasound;Traction;Moist Heat;Therapeutic activities;Therapeutic exercise;Neuromuscular re-education;Patient/family education;Passive range of motion;Manual techniques;Dry needling;Taping;Spinal Manipulations;Joint Manipulations    PT Next Visit Plan  outcome of DN today?    PT Home Exercise Plan  cat-camel-child pose, seated extension, seated sidebend, GHJ ER red tband, supine OH band pull, qped thoracic rotation, dead bug, bird dog     Consulted and Agree with Plan of Care  Patient       Patient will benefit from skilled therapeutic intervention in order to improve the following deficits and impairments:  Increased muscle spasms, Decreased activity tolerance, Pain, Improper body mechanics, Postural dysfunction  Visit Diagnosis: Muscle spasm of back  Abnormal posture     Problem List Patient Active Problem List   Diagnosis Date Noted  . Vitamin B12 deficiency 01/23/2020  . Insulin resistance 01/23/2020  . Vitamin D deficiency 01/23/2020  . Depression 01/23/2020  . 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/2020  . Class 3 severe obesity with serious comorbidity and body mass index (BMI) of 40.0 to 44.9 in adult ([place]) 01/23/2020  . Mild persistent asthma without complication 35/46/5681  . Seasonal allergic conjunctivitis 11/02/2017  . Anxiety 04/10/2016   Karolyne Timmons C. Jimia Gentles PT, DPT 01/24/20 5:48 PM   [place] [facility] [address] [place], [place], [address] Phone: [phone]   Fax:  [phone]  Name: Ashley Acevedo MRN: [id number] Date of Birth: 06-19-1996

## 2020-01-26 ENCOUNTER — Ambulatory Visit: Payer: BC Managed Care – PPO | Admitting: Physical Therapy

## 2020-01-26 ENCOUNTER — Other Ambulatory Visit: Payer: Self-pay

## 2020-01-26 ENCOUNTER — Encounter: Payer: Self-pay | Admitting: Physical Therapy

## 2020-01-26 DIAGNOSIS — M62830 Muscle spasm of back: Secondary | ICD-10-CM | POA: Diagnosis not present

## 2020-01-26 DIAGNOSIS — R293 Abnormal posture: Secondary | ICD-10-CM

## 2020-01-26 NOTE — Therapy (Signed)
[facility] [address] [place], [place], [address] Phone: [phone]   Fax:  [phone]  Physical Therapy Treatment  Patient Details  Name: Ashley Acevedo MRN: [id number] Date of Birth: 1995/12/16 Referring Provider (PT): Rodolph Bong, MD   Encounter Date: 01/26/2020  PT End of Session - 01/26/20 1715    Visit Number  9    Number of Visits  13    Date for PT Re-Evaluation  02/03/20    Authorization Type  BCBS 30VL    PT Start Time  1715    PT Stop Time  1753    PT Time Calculation (min)  38 min    Activity Tolerance  Patient tolerated treatment well    Behavior During Therapy  [facility] for tasks assessed/performed       Past Medical History:  Diagnosis Date  . Allergies   . Anxiety   . Asthma   . Back pain   . GERD (gastroesophageal reflux disease)   . Heartburn   . History of stomach ulcers   . Hyperlipidemia   . Joint pain     Past Surgical History:  Procedure Laterality Date  . WISDOM TOOTH EXTRACTION      There were no vitals filed for this visit.  Subjective Assessment - 01/26/20 1717    Subjective  I'm bruised and tender on my right side, but it feels better. Patient reports that she has been able to correct posture at work.    Pain Score  0-No pain    Pain Location  Back    Pain Orientation  Right;Mid;Lower    Pain Descriptors / Indicators  Discomfort    Pain Type  Chronic pain    Pain Onset  More than a month ago    Pain Frequency  Intermittent                       [facility] Adult PT Treatment/Exercise - 01/26/20 0001      Exercises   Exercises  Other Exercises    Other Exercises   Thoracic Extension over foam roller       Lumbar Exercises: Aerobic   Recumbent Bike  1.5 for 4 mins (2 mins forward, 2 mins back)      Lumbar Exercises: Seated   Other Seated Lumbar Exercises  Anterior pelvic tilts 3 sets of 10       Lumbar Exercises: Supine   Other Supine Lumbar Exercises  Open  book stretch     Other Supine Lumbar Exercises  Child's Pose      Shoulder Exercises: Seated   Retraction  Strengthening;Both;20 reps    Theraband Level (Shoulder Retraction)  Level 2 (Red)      Shoulder Exercises: Standing   Other Standing Exercises  Standing Y scap retraction   For serratus    Other Standing Exercises  Serratus punch arm circles w/ 2 lb ball      Shoulder Exercises: Stretch   Cross Chest Stretch  2 reps;10 seconds      Manual Therapy   Manual Therapy  Joint mobilization    Joint Mobilization  Thoracic manip (Grade V)             PT Education - 01/26/20 1806    Education Details  Patient educated on the importance of doing HEPs and using tools given in PT to correct posture at work    [employer]) Educated  Patient    Methods  Explanation;Demonstration    Comprehension  Verbalized understanding;Returned demonstration       PT Short Term Goals - 01/06/20 0757      PT SHORT TERM GOAL #1   Title  verbalize use & understanding of exercises during her day    Status  Achieved        PT Long Term Goals - 01/17/20 1734      PT LONG TERM GOAL #1   Title  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 computer using exercises    Baseline  gets up and does her stretching at the chair    Status  On-going      PT LONG TERM GOAL #2   Title  demo improved posture while seated at computer    Baseline  making changes for ergonomics, not an option for a standing desk    Status  On-going      PT [place] #3   Title  reduction in right elbow pain    Baseline  more in forearm, old injury in high school (elbow)    Status  On-going      PT LONG TERM GOAL #4   Title  able to sleep on Rt side    Baseline  can lay on it but wakes up on the Rt side , mild improved since PT    Status  On-going            Plan - 01/26/20 1757    Clinical Impression Statement  Patient presents to the clinic with some lower trap soreness, but no pain. Session focused on  addressing above and below problem area and full chain activation. She tolerated the treatment well and reported a decrease in soreness after exercising. We discussed the importance of doing HEPs and using tools given in PT to correct posture at work.    Personal Factors and Comorbidities  Comorbidity 1    Comorbidities  Weight    Examination-Activity Limitations  Reach Overhead;Bed Mobility;Sleep;Bend;Sit;Stand;Lift    Examination-Participation Restrictions  Other    Stability/Clinical Decision Making  Stable/Uncomplicated    Clinical Decision Making  Low    Rehab Potential  Good    PT Frequency  2x / week    PT Duration  6 weeks    PT Treatment/Interventions  ADLs/Self Care Home Management;Cryotherapy;Electrical Stimulation;Functional mobility training;Ultrasound;Traction;Moist Heat;Therapeutic activities;Therapeutic exercise;Neuromuscular re-education;Patient/family education;Passive range of motion;Manual techniques;Dry needling;Taping;Spinal Manipulations;Joint Manipulations    PT Next Visit Plan  Assess how patient responded to session    PT Home Exercise Plan  cat-camel-child pose, seated extension, seated sidebend, GHJ ER red tband, supine OH band pull, qped thoracic rotation, dead bug, bird dog    Consulted and Agree with Plan of Care  Patient       Patient will benefit from skilled therapeutic intervention in order to improve the following deficits and impairments:  Increased muscle spasms, Decreased activity tolerance, Pain, Improper body mechanics, Postural dysfunction  Visit Diagnosis: Muscle spasm of back  Abnormal posture     Problem List Patient Active Problem List   Diagnosis Date Noted  . Vitamin B12 deficiency 01/23/2020  . Insulin resistance 01/23/2020  . Vitamin D deficiency 01/23/2020  . Depression 01/23/2020  . 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/2020  . Class 3 severe obesity with serious comorbidity and body mass index (BMI) of 40.0 to 44.9 in adult ([place]) 01/23/2020   . Mild persistent asthma without complication 88/50/2774  . Seasonal allergic conjunctivitis 11/02/2017  . Anxiety 04/10/2016  Cato Mulligan, SPT 01/26/2020, 6:08 PM  [facility] [address] [place], [place], [address] Phone: [phone]   Fax:  [phone]  Name: Ashley Acevedo MRN: [id number] Date of Birth: 1996/08/08

## 2020-01-31 ENCOUNTER — Ambulatory Visit: Payer: BC Managed Care – PPO | Admitting: Physical Therapy

## 2020-01-31 ENCOUNTER — Other Ambulatory Visit: Payer: Self-pay

## 2020-01-31 ENCOUNTER — Encounter: Payer: Self-pay | Admitting: Physical Therapy

## 2020-01-31 DIAGNOSIS — R293 Abnormal posture: Secondary | ICD-10-CM | POA: Diagnosis not present

## 2020-01-31 DIAGNOSIS — M62830 Muscle spasm of back: Secondary | ICD-10-CM | POA: Diagnosis not present

## 2020-01-31 NOTE — Therapy (Signed)
[place], [place], [address] Phone: [phone]   Fax:  [phone]  Physical Therapy Treatment/Discharge  Patient Details  Name: Ashley Acevedo MRN: [id number] Date of Birth: 1996-06-03 Referring Provider (PT): Gregor Hams, MD   Encounter Date: 01/31/2020  PT End of Session - 01/31/20 1712    Visit Number  10    Number of Visits  13    Date for PT Re-Evaluation  02/03/20    Authorization Type  BCBS 30VL    PT Start Time  9833    PT Stop Time  1732    PT Time Calculation (min)  20 min    Activity Tolerance  Patient tolerated treatment well    Behavior During Therapy  [facility] for tasks assessed/performed       Past Medical History:  Diagnosis Date  . Allergies   . Anxiety   . Asthma   . Back pain   . GERD (gastroesophageal reflux disease)   . Heartburn   . History of stomach ulcers   . Hyperlipidemia   . Joint pain     Past Surgical History:  Procedure Laterality Date  . WISDOM TOOTH EXTRACTION      There were no vitals filed for this visit.  Subjective Assessment - 01/31/20 1712    Subjective  Patient reports that her right side is feeling better than it has is a long time. She thinks that ther exercise session helped her. Patient reports minimal pain. "It's like something is there, but it doesn't really hurt."    Pain Score  1     Pain Location  Back    Pain Orientation  Right;Mid;Lower    Pain Descriptors / Indicators  Discomfort    Pain Type  Chronic pain    Pain Onset  More than a month ago    Pain Frequency  Intermittent    Aggravating Factors   stress, sittingt    Pain Relieving Factors  TENS, heat, DN, stretching                               PT Education - 01/31/20 1742    Education Details  Patient educated on importance of continuing HEP and outside sources available to help treat problem area.    Person(s) Educated  Patient    Methods   Explanation;Demonstration;Handout    Comprehension  Verbalized understanding       PT Short Term Goals - 01/06/20 0757      PT SHORT TERM GOAL #1   Title  verbalize use & understanding of exercises during her day    Status  Achieved        PT Long Term Goals - 01/31/20 1718      PT LONG TERM GOAL #1   Title  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 computer using exercises    Baseline  gets up and does her stretching at the chair    Status  Achieved      PT LONG TERM GOAL #2   Title  demo improved posture while seated at computer    Baseline  making changes for ergonomics, not an option for a standing desk    Status  Achieved      PT [place] #3   Title  reduction in right elbow pain    Baseline  more in forearm, old injury in high  school (elbow)    Status  Partially Met      PT LONG TERM GOAL #4   Title  able to sleep on Rt side    Baseline  can lay on it but wakes up on the Rt side , mild improved since PT    Status  Partially Met            Plan - 01/31/20 1733    Clinical Impression Statement  Patient presents to the clinic with minimal back pain. She indicated that this is the best that her back has felt in a long time. She was able to demonstrate good posture and reports that she is able to sleep better throughout the night. She is no longer TTP and stated that she is able to use her exercises in order to decrease pain during work shift. Patient was able to partially meet or achieve all of her goals. She agrees that we have reached a good stopping point in therapy and that she has tools needed for success.    Personal Factors and Comorbidities  Comorbidity 1    Comorbidities  Weight    Examination-Activity Limitations  Bed Mobility    Stability/Clinical Decision Making  Stable/Uncomplicated    Clinical Decision Making  Low    Rehab Potential  Good    PT Treatment/Interventions  ADLs/Self Care Home Management;Cryotherapy;Electrical Stimulation;Functional  mobility training;Ultrasound;Traction;Moist Heat;Therapeutic activities;Therapeutic exercise;Neuromuscular re-education;Patient/family education;Passive range of motion;Manual techniques;Dry needling;Taping;Spinal Manipulations;Joint Manipulations    PT Home Exercise Plan  cat-camel-child pose, seated extension, seated sidebend, GHJ ER red tband, supine OH band pull, qped thoracic rotation, dead bug, bird dog    Consulted and Agree with Plan of Care  Patient       Patient will benefit from skilled therapeutic intervention in order to improve the following deficits and impairments:  Increased muscle spasms, Decreased activity tolerance, Pain, Improper body mechanics, Postural dysfunction  Visit Diagnosis: Muscle spasm of back  Abnormal posture     Problem List Patient Active Problem List   Diagnosis Date Noted  . Vitamin B12 deficiency 01/23/2020  . Insulin resistance 01/23/2020  . Vitamin D deficiency 01/23/2020  . Depression 01/23/2020  . 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/2020  . Class 3 severe obesity with serious comorbidity and body mass index (BMI) of 40.0 to 44.9 in adult ([place]) 01/23/2020  . Mild persistent asthma without complication 50/38/8828  . Seasonal allergic conjunctivitis 11/02/2017  . Anxiety 04/10/2016    Laveda Norman, SPT 01/31/2020, 5:43 PM  [facility] [address] [place], [place], [address] Phone: [phone]   Fax:  [phone]  Name: Ashley Acevedo MRN: [id number] Date of Birth: 04/19/1996       PHYSICAL THERAPY DISCHARGE SUMMARY  Visits from Start of Care: 10  Current functional level related to goals / functional outcomes: Patient able to tolerate work demands.   Remaining deficits: Sleeping on left side, left elbow pain    Education / Equipment: Patient provided HEP  Plan: Patient agrees to discharge.  Patient goals were partially met. Patient is being discharged due to being pleased  with the current functional level.  ?????

## 2020-02-02 ENCOUNTER — Ambulatory Visit: Payer: BC Managed Care – PPO | Admitting: Physical Therapy

## 2020-02-07 ENCOUNTER — Encounter: Payer: BC Managed Care – PPO | Admitting: Physical Therapy

## 2020-02-09 ENCOUNTER — Encounter: Payer: BC Managed Care – PPO | Admitting: Physical Therapy

## 2020-02-14 ENCOUNTER — Encounter: Payer: Self-pay | Admitting: Physician Assistant

## 2020-02-14 NOTE — Telephone Encounter (Signed)
Please advise 

## 2020-02-15 ENCOUNTER — Ambulatory Visit (INDEPENDENT_AMBULATORY_CARE_PROVIDER_SITE_OTHER): Payer: BC Managed Care – PPO | Admitting: Family Medicine

## 2020-02-15 ENCOUNTER — Encounter (INDEPENDENT_AMBULATORY_CARE_PROVIDER_SITE_OTHER): Payer: Self-pay | Admitting: Family Medicine

## 2020-02-15 ENCOUNTER — Other Ambulatory Visit: Payer: Self-pay

## 2020-02-15 VITALS — BP 106/65 | HR 107 | Temp 97.6°F | Ht 68.0 in | Wt 297.0 lb

## 2020-02-15 DIAGNOSIS — Z9189 Other specified personal risk factors, not elsewhere classified: Secondary | ICD-10-CM

## 2020-02-15 DIAGNOSIS — E559 Vitamin D deficiency, unspecified: Secondary | ICD-10-CM | POA: Diagnosis not present

## 2020-02-15 DIAGNOSIS — Z6841 Body Mass Index (BMI) 40.0 and over, adult: Secondary | ICD-10-CM

## 2020-02-15 DIAGNOSIS — F3289 Other specified depressive episodes: Secondary | ICD-10-CM | POA: Diagnosis not present

## 2020-02-15 MED ORDER — ESCITALOPRAM OXALATE 10 MG PO TABS: 10.0000 mg | ORAL_TABLET | Freq: Every day | ORAL | 0 refills | Status: DC

## 2020-02-15 MED ORDER — SAXENDA 18 MG/3ML ~~LOC~~ SOPN: 3.0000 mg | PEN_INJECTOR | Freq: Every day | SUBCUTANEOUS | 0 refills | Status: DC

## 2020-02-15 MED ORDER — VITAMIN D (ERGOCALCIFEROL) 1.25 MG (50000 UNIT) PO CAPS: 50000.0000 [IU] | ORAL_CAPSULE | ORAL | 0 refills | Status: DC

## 2020-02-15 NOTE — Progress Notes (Signed)
Chief Complaint:   OBESITY Ashley Acevedo is here to discuss her progress with her obesity treatment plan along with follow-up of her obesity related diagnoses. Ashley Acevedo is on the Category 3 Plan and states she is following her eating plan approximately 75% of the time. Ashley Acevedo states she is walking for 45 minutes 2-3 times per week.  Today's visit was #: 10 Starting weight: 295 lbs Starting date: 10/31/2019 Today's weight: 297 lbs Today's date: 02/15/2020 Total lbs lost to date: 0 Total lbs lost since last in-office visit: 0  Interim History: Ashley Acevedo felt that she was able to follow the plan easily during the week then will be challenged to remain "on the plan" over the weekend.  Subjective:   1. Vitamin D deficiency Ashley Acevedo's Vit D level on 01/02/2020 was 38.9, goal is >50. She is on prescription strength Vit D supplementation, and she is tolerating it well.  2. Other depression, with emotional eating Ashley Acevedo reports stable mood, and she denies suicidal ideas or homicidal ideas. She has been on Lexapro in college and tolerated it well. She denies increase in emotional eating.  3. At risk for nausea Ashley Acevedo is at risk for nausea due to starting Ashley Acevedo.  Assessment/Plan:   1. Vitamin D deficiency Low Vitamin D level contributes to fatigue and are associated with obesity, breast, and colon cancer. We will refill prescription Vitamin D for 1 month. Beau will follow-up for routine testing of Vitamin D, at least 2-3 times per year to avoid over-replacement.  - Vitamin D, Ergocalciferol, (DRISDOL) 1.25 MG (50000 UNIT) CAPS capsule; Take 1 capsule (50,000 Units total) by mouth every 7 (seven) days.  Dispense: 4 capsule; Refill: 0  2. Other depression, with emotional eating Behavior modification techniques were discussed today to help Ashley Acevedo deal with her emotional/non-hunger eating behaviors. We will refill escitalopram for 1 month. Orders and follow up as documented in patient record.   - escitalopram  (LEXAPRO) 10 MG tablet; Take 1 tablet (10 mg total) by mouth daily.  Dispense: 90 tablet; Refill: 0  3. At risk for nausea Ashley Acevedo was given approximately 15 minutes of nausea prevention counseling today. Ashley Acevedo is at risk for nausea due to starting Ashley Acevedo, and we discussed how to appropriately use injectables. She was encouraged to titrate her medication slowly, make sure to stay hydrated, eat smaller portions throughout the day, and avoid high fat meals.   4. Class 3 severe obesity with serious comorbidity and body mass index (BMI) of 45.0 to 49.9 in adult, unspecified obesity type Ashley Acevedo) Ashley Acevedo is currently in the action stage of change. As such, her goal is to continue with weight loss efforts. She has agreed to the Category 3 Plan.   We discussed various medication options to help Ashley Acevedo with her weight loss efforts and we both agreed to start Ashley Acevedo 3 mg SubQ daily #5 pens with no refills (she is to start at 0.6 mg SubQ daily until her next visit).  - Liraglutide -Weight Management (Ashley Acevedo) 18 MG/3ML SOPN; Inject 0.5 mLs (3 mg total) into the skin daily.  Dispense: 5 pen; Refill: 0  Exercise goals: As is.  Behavioral modification strategies: no skipping meals, meal planning and cooking strategies and travel eating strategies.  Ashley Acevedo has agreed to follow-up with our clinic in 2 weeks. She was informed of the importance of frequent follow-up visits to maximize her success with intensive lifestyle modifications for her multiple health conditions.   Ashley Acevedo was informed we would discuss her lab  results at her next visit unless there is a critical issue that needs to be addressed sooner. Ashley Acevedo agreed to keep her next visit at the agreed upon time to discuss these results.  Objective:   Blood pressure 106/65, pulse (!) 107, temperature 97.6 F (36.4 C), temperature source Oral, height 5\' 8"  (1.727 m), weight 297 lb (134.7 kg), SpO2 97 %. Body mass index is 45.16 kg/m.  General:  Cooperative, alert, well developed, in no acute distress. HEENT: Conjunctivae and lids unremarkable. Cardiovascular: Regular rhythm.  Lungs: Normal work of breathing. Neurologic: No focal deficits.   Lab Results  Component Value Date   CREATININE 0.69 01/02/2020   BUN 12 01/02/2020   NA 141 01/02/2020   K 4.2 01/02/2020   CL 104 01/02/2020   CO2 20 01/02/2020   Lab Results  Component Value Date   ALT 12 01/02/2020   AST 14 01/02/2020   ALKPHOS 82 01/02/2020   BILITOT <0.2 01/02/2020   Lab Results  Component Value Date   HGBA1C 4.9 08/24/2019   Lab Results  Component Value Date   INSULIN 23.6 01/02/2020   INSULIN 27.6 (H) 08/24/2019   No results found for: TSH Lab Results  Component Value Date   CHOL 210 (H) 01/02/2020   HDL 79 01/02/2020   LDLCALC 107 (H) 01/02/2020   TRIG 137 01/02/2020   CHOLHDL 2.7 01/02/2020   Lab Results  Component Value Date   WBC 6.4 01/02/2020   HGB 12.4 01/02/2020   HCT 39.7 01/02/2020   MCV 89 01/02/2020   PLT 327 01/02/2020   Lab Results  Component Value Date   IRON 59 01/02/2020   TIBC 374 01/02/2020   FERRITIN 25 01/02/2020   Attestation Statements:   Reviewed by clinician on day of visit: allergies, medications, problem list, medical history, surgical history, family history, social history, and previous encounter notes.   I, 01/04/2020, am acting as transcriptionist for Burt Knack, MD.  I have reviewed the above documentation for accuracy and completeness, and I agree with the above. -  Quillian Quince, MD

## 2020-02-16 ENCOUNTER — Encounter (INDEPENDENT_AMBULATORY_CARE_PROVIDER_SITE_OTHER): Payer: Self-pay | Admitting: Family Medicine

## 2020-02-20 ENCOUNTER — Other Ambulatory Visit (INDEPENDENT_AMBULATORY_CARE_PROVIDER_SITE_OTHER): Payer: Self-pay

## 2020-02-20 DIAGNOSIS — Z6841 Body Mass Index (BMI) 40.0 and over, adult: Secondary | ICD-10-CM

## 2020-02-20 MED ORDER — INSULIN PEN NEEDLE 32G X 4 MM MISC: 1.0000 | Freq: Every day | 0 refills | Status: DC

## 2020-02-27 ENCOUNTER — Ambulatory Visit (INDEPENDENT_AMBULATORY_CARE_PROVIDER_SITE_OTHER): Payer: BC Managed Care – PPO | Admitting: Family Medicine

## 2020-02-27 ENCOUNTER — Encounter (INDEPENDENT_AMBULATORY_CARE_PROVIDER_SITE_OTHER): Payer: Self-pay | Admitting: Family Medicine

## 2020-02-27 ENCOUNTER — Other Ambulatory Visit: Payer: Self-pay

## 2020-02-27 VITALS — BP 124/69 | HR 86 | Temp 98.4°F | Ht 68.0 in | Wt 298.0 lb

## 2020-02-27 DIAGNOSIS — F3289 Other specified depressive episodes: Secondary | ICD-10-CM

## 2020-02-27 DIAGNOSIS — E559 Vitamin D deficiency, unspecified: Secondary | ICD-10-CM

## 2020-02-27 DIAGNOSIS — Z9189 Other specified personal risk factors, not elsewhere classified: Secondary | ICD-10-CM

## 2020-02-27 DIAGNOSIS — Z6841 Body Mass Index (BMI) 40.0 and over, adult: Secondary | ICD-10-CM

## 2020-02-27 MED ORDER — VITAMIN D (ERGOCALCIFEROL) 1.25 MG (50000 UNIT) PO CAPS: 50000.0000 [IU] | ORAL_CAPSULE | ORAL | 0 refills | Status: DC

## 2020-02-27 MED ORDER — ESCITALOPRAM OXALATE 10 MG PO TABS: 10.0000 mg | ORAL_TABLET | Freq: Every day | ORAL | 0 refills | Status: DC

## 2020-02-27 NOTE — Progress Notes (Signed)
Chief Complaint:   OBESITY Ashley Acevedo is here to discuss her progress with her obesity treatment plan along with follow-up of her obesity related diagnoses. Ashley Acevedo is on the Category 3 Plan and states she is following her eating plan approximately 75% of the time. Ashley Acevedo states she is walking for 45 minutes 3 times per week.  Today's visit was #: 11 Starting weight: 295 lbs Starting date: 10/31/2019 Today's weight: 298 lbs Today's date: 02/27/2020 Total lbs lost to date: 0 Total lbs lost since last in-office visit: 0  Interim History: Ashley Acevedo will be completing her current job in the next two weeks, then [place] She is quite excited to start her new position with [employer].  Subjective:   1. Vitamin D deficiency Ashley Acevedo's Vit D level on 01/02/2020 was 38.9. She is on prescription strength Vit D supplementation.  2. Other depression, with emotional eating Ashley Acevedo reports stable mood and denies suicidal ideas or homicidal ideas. She denies increased emotional eating.  3. At risk for osteoporosis Ashley Acevedo is at higher risk of osteopenia and osteoporosis due to Vitamin D deficiency.   Assessment/Plan:   1. Vitamin D deficiency Low Vitamin D level contributes to fatigue and are associated with obesity, breast, and colon cancer. We will refill prescription Vitamin D for 1 month. Ashley Acevedo will follow-up for routine testing of Vitamin D, at least 2-3 times per year to avoid over-replacement. We will recheck labs at her next office visit.  - Vitamin D, Ergocalciferol, (DRISDOL) 1.25 MG (50000 UNIT) CAPS capsule; Take 1 capsule (50,000 Units total) by mouth every 7 (seven) days.  Dispense: 4 capsule; Refill: 0  2. Other depression, with emotional eating Behavior modification techniques were discussed today to help Ashley Acevedo deal with her emotional/non-hunger eating behaviors. We will refill escitalopram for 1 month. Orders and follow up as documented in  patient record.   - escitalopram (LEXAPRO) 10 MG tablet; Take 1 tablet (10 mg total) by mouth daily.  Dispense: 60 tablet; Refill: 0  3. At risk for osteoporosis Ashley Acevedo was given approximately 15 minutes of osteoporosis prevention counseling today. Ashley Acevedo is at risk for osteopenia and osteoporosis due to her Vitamin D deficiency. She was encouraged to take her Vitamin D and follow her higher calcium diet and increase strengthening exercise to help strengthen her bones and decrease her risk of osteopenia and osteoporosis.  Repetitive spaced learning was employed today to elicit superior memory formation and behavioral change.  4. Class 3 severe obesity with serious comorbidity and body mass index (BMI) of 45.0 to 49.9 in adult, unspecified obesity type [facility]) Ashley Acevedo is currently in the action stage of change. As such, her goal is to continue with weight loss efforts. She has agreed to the Category 3 Plan.   We discussed various medication options to help Ashley Acevedo with her weight loss efforts and we both agreed to continue Ashley Acevedo at 0.6 mg once daily. She is tolerating GLP-1 well and denies medication side effects.  - Liraglutide -Weight Management (Ashley Acevedo) 18 MG/3ML SOPN; Inject 0.5 mLs (3 mg total) into the skin daily.  Dispense: 5 pen; Refill: 0  Exercise goals: As is.  Behavioral modification strategies: increasing lean protein intake, decreasing simple carbohydrates, decreasing eating out and travel eating strategies.  Nacole has agreed to follow-up with our clinic in 6 weeks. She was informed of the importance of frequent follow-up visits to maximize her success with intensive lifestyle modifications for her  multiple health conditions.   Objective:   Blood pressure 124/69, pulse 86, temperature 98.4 F (36.9 C), temperature source Oral, height 5\' 8"  (1.727 m), weight 298 lb (135.2 kg), SpO2 99 %. Body mass index is 45.31 kg/m.  General: Cooperative, alert, well developed, in no acute  distress. HEENT: Conjunctivae and lids unremarkable. Cardiovascular: Regular rhythm.  Lungs: Normal work of breathing. Neurologic: No focal deficits.   Lab Results  Component Value Date   CREATININE 0.69 01/02/2020   BUN 12 01/02/2020   NA 141 01/02/2020   K 4.2 01/02/2020   CL 104 01/02/2020   CO2 20 01/02/2020   Lab Results  Component Value Date   ALT 12 01/02/2020   AST 14 01/02/2020   ALKPHOS 82 01/02/2020   BILITOT <0.2 01/02/2020   Lab Results  Component Value Date   HGBA1C 4.9 08/24/2019   Lab Results  Component Value Date   INSULIN 23.6 01/02/2020   INSULIN 27.6 (H) 08/24/2019   No results found for: TSH Lab Results  Component Value Date   CHOL 210 (H) 01/02/2020   HDL 79 01/02/2020   LDLCALC 107 (H) 01/02/2020   TRIG 137 01/02/2020   CHOLHDL 2.7 01/02/2020   Lab Results  Component Value Date   WBC 6.4 01/02/2020   HGB 12.4 01/02/2020   HCT 39.7 01/02/2020   MCV 89 01/02/2020   PLT 327 01/02/2020   Lab Results  Component Value Date   IRON 59 01/02/2020   TIBC 374 01/02/2020   FERRITIN 25 01/02/2020   Attestation Statements:   Reviewed by clinician on day of visit: allergies, medications, problem list, medical history, surgical history, family history, social history, and previous encounter notes.   I, Trixie Dredge, am acting as transcriptionist for Dennard Nip, MD.  I have reviewed the above documentation for accuracy and completeness, and I agree with the above. -  Dennard Nip, MD

## 2020-02-28 MED ORDER — SAXENDA 18 MG/3ML ~~LOC~~ SOPN: 3.0000 mg | PEN_INJECTOR | Freq: Every day | SUBCUTANEOUS | 0 refills | Status: DC

## 2020-02-29 DIAGNOSIS — D225 Melanocytic nevi of trunk: Secondary | ICD-10-CM | POA: Diagnosis not present

## 2020-02-29 DIAGNOSIS — L719 Rosacea, unspecified: Secondary | ICD-10-CM | POA: Diagnosis not present

## 2020-02-29 DIAGNOSIS — L814 Other melanin hyperpigmentation: Secondary | ICD-10-CM | POA: Diagnosis not present

## 2020-02-29 DIAGNOSIS — L578 Other skin changes due to chronic exposure to nonionizing radiation: Secondary | ICD-10-CM | POA: Diagnosis not present

## 2020-03-18 ENCOUNTER — Other Ambulatory Visit (INDEPENDENT_AMBULATORY_CARE_PROVIDER_SITE_OTHER): Payer: Self-pay | Admitting: Family Medicine

## 2020-03-20 ENCOUNTER — Other Ambulatory Visit (INDEPENDENT_AMBULATORY_CARE_PROVIDER_SITE_OTHER): Payer: Self-pay | Admitting: Family Medicine

## 2020-03-20 DIAGNOSIS — E559 Vitamin D deficiency, unspecified: Secondary | ICD-10-CM

## 2020-04-09 ENCOUNTER — Ambulatory Visit (INDEPENDENT_AMBULATORY_CARE_PROVIDER_SITE_OTHER): Payer: BC Managed Care – PPO | Admitting: Physician Assistant

## 2020-04-11 ENCOUNTER — Encounter (INDEPENDENT_AMBULATORY_CARE_PROVIDER_SITE_OTHER): Payer: Self-pay | Admitting: Family Medicine

## 2020-04-11 ENCOUNTER — Other Ambulatory Visit: Payer: Self-pay

## 2020-04-11 ENCOUNTER — Ambulatory Visit (INDEPENDENT_AMBULATORY_CARE_PROVIDER_SITE_OTHER): Payer: BC Managed Care – PPO | Admitting: Family Medicine

## 2020-04-11 VITALS — BP 134/80 | HR 88 | Temp 97.9°F | Ht 68.0 in | Wt 307.0 lb

## 2020-04-11 DIAGNOSIS — F3289 Other specified depressive episodes: Secondary | ICD-10-CM

## 2020-04-11 DIAGNOSIS — E538 Deficiency of other specified B group vitamins: Secondary | ICD-10-CM | POA: Diagnosis not present

## 2020-04-11 DIAGNOSIS — G4700 Insomnia, unspecified: Secondary | ICD-10-CM | POA: Diagnosis not present

## 2020-04-11 DIAGNOSIS — Z6841 Body Mass Index (BMI) 40.0 and over, adult: Secondary | ICD-10-CM

## 2020-04-11 DIAGNOSIS — E559 Vitamin D deficiency, unspecified: Secondary | ICD-10-CM | POA: Diagnosis not present

## 2020-04-11 DIAGNOSIS — E8881 Metabolic syndrome: Secondary | ICD-10-CM

## 2020-04-11 DIAGNOSIS — Z9189 Other specified personal risk factors, not elsewhere classified: Secondary | ICD-10-CM | POA: Diagnosis not present

## 2020-04-11 NOTE — Progress Notes (Signed)
Chief Complaint:   OBESITY Ashley Acevedo is here to discuss her progress with her obesity treatment plan along with follow-up of her obesity related diagnoses. Ashley Acevedo is on the Category 3 Plan and states she is following her eating plan approximately 50% of the time. Ashley Acevedo states she is walking for 30 minutes 2-3 times per week.  Today's visit was #: 12 Starting weight: 295 lbs Starting date: 10/31/2019 Today's weight: 307 lbs Today's date: 04/11/2020 Total lbs lost to date: 0 Total lbs lost since last in-office visit: 0  Interim History: Today's bioimpedance results indicate that [name] has gained 5 pounds of water weight since her last visit.  She was on vacation for 3 weeks.  She says she will be getting a puppy.  Subjective:   1. Vitamin D deficiency Ashley Acevedo's Vitamin D level was 38.9 on 01/02/2020. She is currently taking prescription vitamin D 50,000 IU each week. She denies nausea, vomiting or muscle weakness.  2. Vitamin B12 deficiency She is not a vegetarian.  She does not have a previous diagnosis of pernicious anemia.  She does not have a history of weight loss surgery.   Lab Results  Component Value Date   VITAMINB12 360 01/02/2020   3. Insulin resistance Ashley Acevedo has a diagnosis of insulin resistance based on her elevated fasting insulin level >5. She continues to work on diet and exercise to decrease her risk of diabetes.  Lab Results  Component Value Date   INSULIN 23.6 01/02/2020   INSULIN 27.6 (H) 08/24/2019   Lab Results  Component Value Date   HGBA1C 4.9 08/24/2019   4. Other depression, with emotional eating [name] is struggling with emotional eating and using food for comfort to the extent that it is negatively impacting her health. She has been working on behavior modification techniques to help reduce her emotional eating and has been successful. She shows no sign of suicidal or homicidal ideations.  5. At risk for deficient intake of food The patient is at a  higher than average risk of deficient intake of food.  Assessment/Plan:   1. Vitamin D deficiency Low Vitamin D level contributes to fatigue and are associated with obesity, breast, and colon cancer. She agrees to continue to take prescription Vitamin D @50 ,000 IU every week and will follow-up for routine testing of Vitamin D, at least 2-3 times per year to avoid over-replacement.  Orders - VITAMIN D 25 Hydroxy (Vit-D Deficiency, Fractures)  2. Vitamin B12 deficiency The diagnosis was reviewed with the patient. Counseling provided today, see below. We will continue to monitor. Orders and follow up as documented in patient record.  Counseling . The body needs vitamin B12: to make red blood cells; to make DNA; and to help the nerves work properly so they can carry messages from the brain to the body.  . The main causes of vitamin B12 deficiency include dietary deficiency, digestive diseases, pernicious anemia, and having a surgery in which part of the stomach or small intestine is removed.  . Certain medicines can make it harder for the body to absorb vitamin B12. These medicines include: heartburn medications; some antibiotics; some medications used to treat diabetes, gout, and high cholesterol.  . In some cases, there are no symptoms of this condition. If the condition leads to anemia or nerve damage, various symptoms can occur, such as weakness or fatigue, shortness of breath, and numbness or tingling in your hands and feet.   . Treatment:  o May include taking vitamin  B12 supplements.  o Avoid alcohol.  o Eat lots of healthy foods that contain vitamin B12: - Beef, pork, chicken, [place], and organ meats, such as liver.  - Seafood: This includes clams, rainbow trout, salmon, tuna, and haddock. Eggs.  - Cereal and dairy products that are fortified: This means that vitamin B12 has been added to the food.   3. Insulin resistance [name] will continue to work on weight loss, exercise, and  decreasing simple carbohydrates to help decrease the risk of diabetes. Ashley Acevedo agreed to follow-up with [place] as directed to closely monitor her progress.  Orders - Anemia panel - CBC with Differential/Platelet - Comprehensive metabolic panel - Hemoglobin A1c - Insulin, random - Lipid Panel With LDL/HDL Ratio  4. Other depression, with emotional eating Behavior modification techniques were discussed today to help Ashley Acevedo deal with her emotional/non-hunger eating behaviors.  Orders and follow up as documented in patient record.   5. At risk for deficient intake of food Ashley Acevedo was given approximately 15 minutes of deficit intake of food prevention counseling today. [name] is at risk for eating too few calories based on current food recall. She was encouraged to focus on meeting caloric and protein goals according to her recommended meal plan.   6. Class 3 severe obesity with serious comorbidity and body mass index (BMI) of 45.0 to 49.9 in adult, unspecified obesity type [facility]) Ashley Acevedo is currently in the action stage of change. As such, her goal is to continue with weight loss efforts. She has agreed to the Category 3 Plan.   Exercise goals: For substantial health benefits, adults should do at least 150 minutes (2 hours and 30 minutes) a week of moderate-intensity, or 75 minutes (1 hour and 15 minutes) a week of vigorous-intensity aerobic physical activity, or an equivalent combination of moderate- and vigorous-intensity aerobic activity. Aerobic activity should be performed in episodes of at least 10 minutes, and preferably, it should be spread throughout the week.  Behavioral modification strategies: increasing lean protein intake, decreasing simple carbohydrates and increasing vegetables.  Ashley Acevedo has agreed to follow-up with our clinic in 2-3 weeks. She was informed of the importance of frequent follow-up visits to maximize her success with intensive lifestyle modifications for her multiple health  conditions.   Ashley Acevedo was informed we would discuss her lab results at her next visit unless there is a critical issue that needs to be addressed sooner. Ashley Acevedo agreed to keep her next visit at the agreed upon time to discuss these results.  Objective:   Blood pressure 134/80, pulse 88, temperature 97.9 F (36.6 C), temperature source Oral, height 5\' 8"  (1.727 m), weight (!) 307 lb (139.3 kg), SpO2 98 %. Body mass index is 46.68 kg/m.  General: Cooperative, alert, well developed, in no acute distress. HEENT: Conjunctivae and lids unremarkable. Cardiovascular: Regular rhythm.  Lungs: Normal work of breathing. Neurologic: No focal deficits.   Lab Results  Component Value Date   CREATININE 0.69 01/02/2020   BUN 12 01/02/2020   NA 141 01/02/2020   K 4.2 01/02/2020   CL 104 01/02/2020   CO2 20 01/02/2020   Lab Results  Component Value Date   ALT 12 01/02/2020   AST 14 01/02/2020   ALKPHOS 82 01/02/2020   BILITOT <0.2 01/02/2020   Lab Results  Component Value Date   HGBA1C 4.9 08/24/2019   Lab Results  Component Value Date   INSULIN 23.6 01/02/2020   INSULIN 27.6 (H) 08/24/2019   Lab Results  Component Value Date  CHOL 210 (H) 01/02/2020   HDL 79 01/02/2020   LDLCALC 107 (H) 01/02/2020   TRIG 137 01/02/2020   CHOLHDL 2.7 01/02/2020   Lab Results  Component Value Date   WBC 6.4 01/02/2020   HGB 12.4 01/02/2020   HCT 39.7 01/02/2020   MCV 89 01/02/2020   PLT 327 01/02/2020   Lab Results  Component Value Date   IRON 59 01/02/2020   TIBC 374 01/02/2020   FERRITIN 25 01/02/2020   Attestation Statements:   Reviewed by clinician on day of visit: allergies, medications, problem list, medical history, surgical history, family history, social history, and previous encounter notes.  I, Insurance claims handler, CMA, am acting as transcriptionist for Helane Rima, DO  I have reviewed the above documentation for accuracy and completeness, and I agree with the above. Helane Rima, DO

## 2020-04-12 LAB — LIPID PANEL WITH LDL/HDL RATIO
Cholesterol, Total: 240 mg/dL — ABNORMAL HIGH (ref 100–199)
HDL: 87 mg/dL (ref >39)
LDL Chol Calc (NIH): 127 mg/dL — ABNORMAL HIGH (ref 0–99)
LDL/HDL Ratio: 1.5 ratio (ref 0.0–3.2)
Triglycerides: 149 mg/dL (ref 0–149)
VLDL Cholesterol Cal: 26 mg/dL (ref 5–40)

## 2020-04-12 LAB — COMPREHENSIVE METABOLIC PANEL
ALT: 16 IU/L (ref 0–32)
AST: 13 IU/L (ref 0–40)
Albumin/Globulin Ratio: 1.5 (ref 1.2–2.2)
Albumin: 4 g/dL (ref 3.9–5.0)
Alkaline Phosphatase: 78 IU/L (ref 48–121)
BUN/Creatinine Ratio: 22 (ref 9–23)
BUN: 14 mg/dL (ref 6–20)
Bilirubin Total: <0.2 mg/dL (ref 0.0–1.2)
CO2: 23 mmol/L (ref 20–29)
Calcium: 9.2 mg/dL (ref 8.7–10.2)
Chloride: 105 mmol/L (ref 96–106)
Creatinine, Ser: 0.65 mg/dL (ref 0.57–1.00)
GFR calc Af Amer: 143 mL/min/{1.73_m2} (ref >59)
GFR calc non Af Amer: 124 mL/min/{1.73_m2} (ref >59)
Globulin, Total: 2.6 g/dL (ref 1.5–4.5)
Glucose: 84 mg/dL (ref 65–99)
Potassium: 4.3 mmol/L (ref 3.5–5.2)
Sodium: 141 mmol/L (ref 134–144)
Total Protein: 6.6 g/dL (ref 6.0–8.5)

## 2020-04-12 LAB — VITAMIN D 25 HYDROXY (VIT D DEFICIENCY, FRACTURES): Vit D, 25-Hydroxy: 30.8 ng/mL (ref 30.0–100.0)

## 2020-04-12 LAB — ANEMIA PANEL
Ferritin: 29 ng/mL (ref 15–150)
Folate, Hemolysate: 459 ng/mL
Folate, RBC: 1202 ng/mL (ref >498)
Hematocrit: 38.2 % (ref 34.0–46.6)
Iron Saturation: 21 % (ref 15–55)
Iron: 78 ug/dL (ref 27–159)
Retic Ct Pct: 1.9 % (ref 0.6–2.6)
Total Iron Binding Capacity: 371 ug/dL (ref 250–450)
UIBC: 293 ug/dL (ref 131–425)
Vitamin B-12: 348 pg/mL (ref 232–1245)

## 2020-04-12 LAB — CBC WITH DIFFERENTIAL/PLATELET
Basophils Absolute: 0.1 10*3/uL (ref 0.0–0.2)
Basos: 1 %
EOS (ABSOLUTE): 0.2 10*3/uL (ref 0.0–0.4)
Eos: 3 %
Hemoglobin: 12.3 g/dL (ref 11.1–15.9)
Immature Grans (Abs): 0 10*3/uL (ref 0.0–0.1)
Immature Granulocytes: 0 %
Lymphocytes Absolute: 2.9 10*3/uL (ref 0.7–3.1)
Lymphs: 38 %
MCH: 28.3 pg (ref 26.6–33.0)
MCHC: 32.2 g/dL (ref 31.5–35.7)
MCV: 88 fL (ref 79–97)
Monocytes Absolute: 0.4 10*3/uL (ref 0.1–0.9)
Monocytes: 6 %
Neutrophils Absolute: 4.1 10*3/uL (ref 1.4–7.0)
Neutrophils: 52 %
Platelets: 300 10*3/uL (ref 150–450)
RBC: 4.34 x10E6/uL (ref 3.77–5.28)
RDW: 12.9 % (ref 11.7–15.4)
WBC: 7.8 10*3/uL (ref 3.4–10.8)

## 2020-04-12 LAB — HEMOGLOBIN A1C
Est. average glucose Bld gHb Est-mCnc: 100 mg/dL
Hgb A1c MFr Bld: 5.1 % (ref 4.8–5.6)

## 2020-04-12 LAB — INSULIN, RANDOM: INSULIN: 26 u[IU]/mL — ABNORMAL HIGH (ref 2.6–24.9)

## 2020-04-26 ENCOUNTER — Encounter (INDEPENDENT_AMBULATORY_CARE_PROVIDER_SITE_OTHER): Payer: Self-pay | Admitting: Family Medicine

## 2020-04-26 ENCOUNTER — Other Ambulatory Visit (INDEPENDENT_AMBULATORY_CARE_PROVIDER_SITE_OTHER): Payer: Self-pay | Admitting: Family Medicine

## 2020-04-26 ENCOUNTER — Other Ambulatory Visit: Payer: Self-pay

## 2020-04-26 ENCOUNTER — Ambulatory Visit (INDEPENDENT_AMBULATORY_CARE_PROVIDER_SITE_OTHER): Payer: BC Managed Care – PPO | Admitting: Family Medicine

## 2020-04-26 VITALS — BP 104/68 | HR 82 | Temp 98.0°F | Ht 68.0 in | Wt 307.0 lb

## 2020-04-26 DIAGNOSIS — E7800 Pure hypercholesterolemia, unspecified: Secondary | ICD-10-CM

## 2020-04-26 DIAGNOSIS — E559 Vitamin D deficiency, unspecified: Secondary | ICD-10-CM

## 2020-04-26 DIAGNOSIS — Z9189 Other specified personal risk factors, not elsewhere classified: Secondary | ICD-10-CM

## 2020-04-26 DIAGNOSIS — Z6841 Body Mass Index (BMI) 40.0 and over, adult: Secondary | ICD-10-CM

## 2020-04-26 DIAGNOSIS — E8881 Metabolic syndrome: Secondary | ICD-10-CM | POA: Diagnosis not present

## 2020-04-26 DIAGNOSIS — F3289 Other specified depressive episodes: Secondary | ICD-10-CM

## 2020-04-26 MED ORDER — VITAMIN D (ERGOCALCIFEROL) 1.25 MG (50000 UNIT) PO CAPS: 50000.0000 [IU] | ORAL_CAPSULE | ORAL | 1 refills | Status: DC

## 2020-04-26 NOTE — Progress Notes (Signed)
Chief Complaint:   OBESITY Ashley Acevedo is here to discuss her progress with her obesity treatment plan along with follow-up of her obesity related diagnoses. Ashley Acevedo is on the Category 3 Plan and states she is following her eating plan approximately 70% of the time. Ashley Acevedo states she is walking for 30 minutes 2 times per week.  Today's visit was #: 13 Starting weight: 295 lbs Starting date: 10/31/2019 Today's weight: 307 lbs Today's date: 04/26/2020 Total lbs lost to date: 0 Total lbs lost since last in-office visit: 0  Interim History: Ashley Acevedo is taking Saxenda 0.6 mg with no side effects.  Subjective:   1. Vitamin D deficiency Ashley Acevedo Vitamin D level was 30.8 on 04/11/2020. She is currently taking prescription vitamin D 50,000 IU each week. She denies nausea, vomiting or muscle weakness.  2. Insulin resistance Ashley Acevedo has a diagnosis of insulin resistance based on her elevated fasting insulin level >5. She continues to work on diet and exercise to decrease her risk of diabetes.  Lab Results  Component Value Date   INSULIN 26.0 (H) 04/11/2020   INSULIN 23.6 01/02/2020   INSULIN 27.6 (H) 08/24/2019   Lab Results  Component Value Date   HGBA1C 5.1 04/11/2020   3. Pure hypercholesterolemia Ashley Acevedo has hypercholesterolemia and has been trying to improve her cholesterol levels with intensive lifestyle modification including a low saturated fat diet, exercise and weight loss. She denies any chest pain, claudication or myalgias.  Lab Results  Component Value Date   ALT 16 04/11/2020   AST 13 04/11/2020   ALKPHOS 78 04/11/2020   BILITOT <0.2 04/11/2020   Lab Results  Component Value Date   CHOL 240 (H) 04/11/2020   HDL 87 04/11/2020   LDLCALC 127 (H) 04/11/2020   TRIG 149 04/11/2020   CHOLHDL 2.7 01/02/2020   4. At risk for constipation Ashley Acevedo is at increased risk for constipation due to inadequate water intake, changes in diet, and/or use of medications such as GLP1 agonists. Ashley Acevedo  denies hard, infrequent stools currently.   Assessment/Plan:   1. Vitamin D deficiency Worsening.  Discussed labs with patient today.  Low Vitamin D level contributes to fatigue and are associated with obesity, breast, and colon cancer. She agrees to continue to take prescription Vitamin D @50 ,000 IU every week and will follow-up for routine testing of Vitamin D, at least 2-3 times per year to avoid over-replacement.  Orders - Vitamin D, Ergocalciferol, (DRISDOL) 1.25 MG (50000 UNIT) CAPS capsule; Take 1 capsule (50,000 Units total) by mouth every 7 (seven) days.  Dispense: 4 capsule; Refill: 1  2. Insulin resistance Worsening.  Discussed labs with patient today.  Ashley Acevedo will continue to work on weight loss, exercise, and decreasing simple carbohydrates to help decrease the risk of diabetes. Ashley Acevedo agreed to follow-up with as directed to closely monitor her progress.  3. Pure hypercholesterolemia Worsening.  Discussed labs with patient today.  Cardiovascular risk and specific lipid/LDL goals reviewed.  We discussed several lifestyle modifications today and Ashley Acevedo will continue to work on diet, exercise and weight loss efforts. Orders and follow up as documented in patient record.   Counseling Intensive lifestyle modifications are the first line treatment for this issue. . Dietary changes: Increase soluble fiber. Decrease simple carbohydrates. . Exercise changes: Moderate to vigorous-intensity aerobic activity 150 minutes per week if tolerated. . Lipid-lowering medications: see documented in medical record.  4. At risk for constipation [name] was given approximately 15 minutes of counseling today regarding prevention of  constipation. She was encouraged to increase water and fiber intake.   5. Class 3 severe obesity with serious comorbidity and body mass index (BMI) of 45.0 to 49.9 in adult, unspecified obesity type Ashley Acevedo) Ashley Acevedo is currently in the action stage of change. As such, her goal is  to continue with weight loss efforts. She has agreed to the Category 3 Plan.   Exercise goals: For substantial health benefits, adults should do at least 150 minutes (2 hours and 30 minutes) a week of moderate-intensity, or 75 minutes (1 hour and 15 minutes) a week of vigorous-intensity aerobic physical activity, or an equivalent combination of moderate- and vigorous-intensity aerobic activity. Aerobic activity should be performed in episodes of at least 10 minutes, and preferably, it should be spread throughout the week.  Behavioral modification strategies: increasing lean protein intake.  Orders Increase Saxenda to 1.2 mg daily for 1 week, then 1.8 mg daily.  Ashley Acevedo has agreed to follow-up with our clinic in 3-4 weeks. She was informed of the importance of frequent follow-up visits to maximize her success with intensive lifestyle modifications for her multiple health conditions.   Objective:   Blood pressure 104/68, pulse 82, temperature 98 F (36.7 C), temperature source Oral, height 5\' 8"  (1.727 m), weight (!) 307 lb (139.3 kg), SpO2 95 %. Body mass index is 46.68 kg/m.  General: Cooperative, alert, well developed, in no acute distress. HEENT: Conjunctivae and lids unremarkable. Cardiovascular: Regular rhythm.  Lungs: Normal work of breathing. Neurologic: No focal deficits.   Lab Results  Component Value Date   CREATININE 0.65 04/11/2020   BUN 14 04/11/2020   NA 141 04/11/2020   K 4.3 04/11/2020   CL 105 04/11/2020   CO2 23 04/11/2020   Lab Results  Component Value Date   ALT 16 04/11/2020   AST 13 04/11/2020   ALKPHOS 78 04/11/2020   BILITOT <0.2 04/11/2020   Lab Results  Component Value Date   HGBA1C 5.1 04/11/2020   HGBA1C 4.9 08/24/2019   Lab Results  Component Value Date   INSULIN 26.0 (H) 04/11/2020   INSULIN 23.6 01/02/2020   INSULIN 27.6 (H) 08/24/2019   Lab Results  Component Value Date   CHOL 240 (H) 04/11/2020   HDL 87 04/11/2020   LDLCALC 127 (H)  04/11/2020   TRIG 149 04/11/2020   CHOLHDL 2.7 01/02/2020   Lab Results  Component Value Date   WBC 7.8 04/11/2020   HGB 12.3 04/11/2020   HCT 38.2 04/11/2020   MCV 88 04/11/2020   PLT 300 04/11/2020   Lab Results  Component Value Date   IRON 78 04/11/2020   TIBC 371 04/11/2020   FERRITIN 29 04/11/2020   Attestation Statements:   Reviewed by clinician on day of visit: allergies, medications, problem list, medical history, surgical history, family history, social history, and previous encounter notes.  I, 04/13/2020, CMA, am acting as transcriptionist for Insurance claims handler, DO  I have reviewed the above documentation for accuracy and completeness, and I agree with the above. Helane Rima, DO

## 2020-05-22 ENCOUNTER — Ambulatory Visit (INDEPENDENT_AMBULATORY_CARE_PROVIDER_SITE_OTHER): Payer: BC Managed Care – PPO | Admitting: Adult Health

## 2020-05-22 ENCOUNTER — Encounter (INDEPENDENT_AMBULATORY_CARE_PROVIDER_SITE_OTHER): Payer: Self-pay | Admitting: Adult Health

## 2020-05-22 ENCOUNTER — Encounter (INDEPENDENT_AMBULATORY_CARE_PROVIDER_SITE_OTHER): Payer: Self-pay

## 2020-05-22 ENCOUNTER — Other Ambulatory Visit: Payer: Self-pay

## 2020-05-22 VITALS — BP 124/83 | HR 98 | Temp 97.9°F | Ht 68.0 in | Wt 310.0 lb

## 2020-05-22 DIAGNOSIS — F3289 Other specified depressive episodes: Secondary | ICD-10-CM

## 2020-05-22 DIAGNOSIS — Z9189 Other specified personal risk factors, not elsewhere classified: Secondary | ICD-10-CM | POA: Diagnosis not present

## 2020-05-22 DIAGNOSIS — E559 Vitamin D deficiency, unspecified: Secondary | ICD-10-CM | POA: Diagnosis not present

## 2020-05-22 DIAGNOSIS — Z6841 Body Mass Index (BMI) 40.0 and over, adult: Secondary | ICD-10-CM

## 2020-05-22 MED ORDER — VITAMIN D (ERGOCALCIFEROL) 1.25 MG (50000 UNIT) PO CAPS: 50000.0000 [IU] | ORAL_CAPSULE | ORAL | 1 refills | Status: DC

## 2020-05-22 NOTE — Telephone Encounter (Signed)
Please advise 

## 2020-05-22 NOTE — Progress Notes (Signed)
Chief Complaint:   OBESITY Ashley Acevedo is here to discuss her progress with her obesity treatment plan along with follow-up of her obesity related diagnoses. Ashley Acevedo is on the Category 3 Plan and states she is following her eating plan approximately 70% of the time. Ashley Acevedo states she is going to an exercise class 60 minutes 2 times per week.  Today's visit was #: 14 Starting weight: 295 lbs Starting date: 08/24/2019 Today's weight: 310 lbs Today's date: 05/22/2020 Total lbs lost to date: 0 Total lbs lost since last in-office visit: 0  Interim History: Ashley Acevedo has increased Saxenda to 1.2 mg daily and denies nausea or constipation. She reports a significantly decreased appetite since increasing GLP-1 and it has made it difficult to eat all of the food on the Category 3 meal plan.  Subjective:   Vitamin D deficiency. Ashley Acevedo is on Ergocalciferol and denies nausea, vomiting, or muscle weakness.    Ref. Range 04/11/2020 13:25  Vitamin D, 25-Hydroxy Latest Ref Range: 30.0 - 100.0 ng/mL 30.8   Other depression, with emotional eating. Ashley Acevedo is struggling with emotional eating and using food for comfort to the extent that it is negatively impacting her health. She has been working on behavior modification techniques to help reduce her emotional eating and has been somewhat successful. She shows no sign of suicidal or homicidal ideations. Ashley Acevedo reports stable mood and denies emotional eating. She is on escitalopram 10 mg daily.  At risk for deficient intake of food. The patient is at a higher than average risk of deficient intake of food due to reduced appetite with GLP-1. Meal plans were changed to account for reduced appetite.  Assessment/Plan:   Vitamin D deficiency. Low Vitamin D level contributes to fatigue and are associated with obesity, breast, and colon cancer. She was given a refill on her Vitamin D, Ergocalciferol, (DRISDOL) 1.25 MG (50000 UNIT) CAPS capsule every week #4 with 0  refills and will follow-up for routine testing of Vitamin D every 3 months.   Other depression, with emotional eating. Behavior modification techniques were discussed today to help Ashley Acevedo deal with her emotional/non-hunger eating behaviors.  Orders and follow up as documented in patient record. She will continue escitalopram as directed (no medication refill today).  At risk for deficient intake of food. Ashley Acevedo was given approximately 15 minutes of deficit intake of food prevention counseling today. Ashley Acevedo is at risk for eating too few calories based on current food recall. She was encouraged to focus on meeting caloric and protein goals according to her recommended meal plan.   Class 3 severe obesity with serious comorbidity and body mass index (BMI) of 45.0 to 49.9 in adult, unspecified obesity type (HCC).  Ashley Acevedo is currently in the action stage of change. As such, her goal is to continue with weight loss efforts. She has agreed to change plans and will now follow the Category 2 Plan due to a significantly reduced appetite.  Exercise goals: Ashley Acevedo will continue her current exercise regimen.   Behavioral modification strategies: increasing lean protein intake, increasing water intake, meal planning and cooking strategies and planning for success.  Ashley Acevedo has agreed to follow-up with our clinic in 2-3 weeks. She was informed of the importance of frequent follow-up visits to maximize her success with intensive lifestyle modifications for her multiple health conditions.   Objective:   Blood pressure 124/83, pulse 98, temperature 97.9 F (36.6 C), temperature source Oral, height 5\' 8"  (1.727 m), weight (!) 310 lb (140.6  kg), SpO2 95 %. Body mass index is 47.14 kg/m.  General: Cooperative, alert, well developed, in no acute distress. HEENT: Conjunctivae and lids unremarkable. Cardiovascular: Regular rhythm.  Lungs: Normal work of breathing. Neurologic: No focal deficits.   Lab Results  Component  Value Date   CREATININE 0.65 04/11/2020   BUN 14 04/11/2020   NA 141 04/11/2020   K 4.3 04/11/2020   CL 105 04/11/2020   CO2 23 04/11/2020   Lab Results  Component Value Date   ALT 16 04/11/2020   AST 13 04/11/2020   ALKPHOS 78 04/11/2020   BILITOT <0.2 04/11/2020   Lab Results  Component Value Date   HGBA1C 5.1 04/11/2020   HGBA1C 4.9 08/24/2019   Lab Results  Component Value Date   INSULIN 26.0 (H) 04/11/2020   INSULIN 23.6 01/02/2020   INSULIN 27.6 (H) 08/24/2019   No results found for: TSH Lab Results  Component Value Date   CHOL 240 (H) 04/11/2020   HDL 87 04/11/2020   LDLCALC 127 (H) 04/11/2020   TRIG 149 04/11/2020   CHOLHDL 2.7 01/02/2020   Lab Results  Component Value Date   WBC 7.8 04/11/2020   HGB 12.3 04/11/2020   HCT 38.2 04/11/2020   MCV 88 04/11/2020   PLT 300 04/11/2020   Lab Results  Component Value Date   IRON 78 04/11/2020   TIBC 371 04/11/2020   FERRITIN 29 04/11/2020   Attestation Statements:   Reviewed by clinician on day of visit: allergies, medications, problem list, medical history, surgical history, family history, social history, and previous encounter notes.  I, Marianna Payment, am acting as Energy manager for [employer], NP-C   I have reviewed the above documentation for accuracy and completeness, and I agree with the above. -  Julaine Fusi, NP

## 2020-06-12 ENCOUNTER — Ambulatory Visit (INDEPENDENT_AMBULATORY_CARE_PROVIDER_SITE_OTHER): Payer: BC Managed Care – PPO | Admitting: Adult Health

## 2020-06-12 ENCOUNTER — Encounter (INDEPENDENT_AMBULATORY_CARE_PROVIDER_SITE_OTHER): Payer: Self-pay | Admitting: Adult Health

## 2020-06-12 ENCOUNTER — Other Ambulatory Visit: Payer: Self-pay

## 2020-06-12 VITALS — BP 110/68 | HR 91 | Temp 98.0°F | Ht 68.0 in | Wt 311.0 lb

## 2020-06-12 DIAGNOSIS — F3289 Other specified depressive episodes: Secondary | ICD-10-CM

## 2020-06-12 DIAGNOSIS — E8881 Metabolic syndrome: Secondary | ICD-10-CM | POA: Diagnosis not present

## 2020-06-12 DIAGNOSIS — Z9189 Other specified personal risk factors, not elsewhere classified: Secondary | ICD-10-CM | POA: Diagnosis not present

## 2020-06-12 DIAGNOSIS — E782 Mixed hyperlipidemia: Secondary | ICD-10-CM

## 2020-06-12 DIAGNOSIS — Z6841 Body Mass Index (BMI) 40.0 and over, adult: Secondary | ICD-10-CM

## 2020-06-12 DIAGNOSIS — E559 Vitamin D deficiency, unspecified: Secondary | ICD-10-CM | POA: Diagnosis not present

## 2020-06-12 HISTORY — DX: Mixed hyperlipidemia: E78.2

## 2020-06-12 MED ORDER — SAXENDA 18 MG/3ML ~~LOC~~ SOPN: 3.0000 mg | PEN_INJECTOR | Freq: Every day | SUBCUTANEOUS | 0 refills | Status: DC

## 2020-06-12 MED ORDER — ESCITALOPRAM OXALATE 10 MG PO TABS: 10.0000 mg | ORAL_TABLET | Freq: Every day | ORAL | 0 refills | Status: DC

## 2020-06-12 MED ORDER — VITAMIN D (ERGOCALCIFEROL) 1.25 MG (50000 UNIT) PO CAPS: 50000.0000 [IU] | ORAL_CAPSULE | ORAL | 1 refills | Status: DC

## 2020-06-12 NOTE — Progress Notes (Signed)
Chief Complaint:   OBESITY Ashley Acevedo is here to discuss her progress with her obesity treatment plan along with follow-up of her obesity related diagnoses. Ashley Acevedo is on the Category 2 Plan and states she is following her eating plan approximately 75-80% of the time. Ashley Acevedo states she is doing [employer] puppy 60 minutes 1+ times per week.  Today's visit was #: 15 Starting weight: 295 lbs Starting date: 08/24/2019 Today's weight: 311 lbs Today's date: 06/12/2020 Total lbs lost to date: 0 Total lbs lost since last in-office visit: 0  Interim History: Ashley Acevedo recently had a new puppy placed in the home and it is going great! She is frequently walking the puppy and also hopes to increase Ashley Acevedo from 1 to 2 workouts per week. She has tolerated the increased dose of Saxenda-1.2mg  QD.  Subjective:   Vitamin D deficiency. Ashley Acevedo is on [employer]. No nausea, vomiting, or muscle weakness.    Ref. Range 04/11/2020 13:25  Vitamin D, 25-Hydroxy Latest Ref Range: 30.0 - 100.0 ng/mL 30.8   Mixed hyperlipidemia. Ashley Acevedo has hyperlipidemia and has been trying to improve her cholesterol levels with intensive lifestyle modification including a low saturated fat diet, exercise and weight loss. She denies any chest pain, claudication or myalgias. Lipid panel on 04/11/2020 showed total and LDL elevated. Ashley Acevedo is not on a statin.  Lab Results  Component Value Date   ALT 16 04/11/2020   AST 13 04/11/2020   ALKPHOS 78 04/11/2020   BILITOT <0.2 04/11/2020   Lab Results  Component Value Date   CHOL 240 (H) 04/11/2020   HDL 87 04/11/2020   LDLCALC 127 (H) 04/11/2020   TRIG 149 04/11/2020   CHOLHDL 2.7 01/02/2020   Insulin resistance. Ashley Acevedo has a diagnosis of insulin resistance based on her elevated fasting insulin level >5. She continues to work on diet and exercise to decrease her risk of diabetes. No polyphagia.  Lab Results  Component Value Date   INSULIN 26.0 (H)  04/11/2020   INSULIN 23.6 01/02/2020   INSULIN 27.6 (H) 08/24/2019   Lab Results  Component Value Date   HGBA1C 5.1 04/11/2020   Other depression, with emotional eating. Ashley Acevedo is struggling with emotional eating and using food for comfort to the extent that it is negatively impacting her health. She has been working on behavior modification techniques to help reduce her emotional eating and has been somewhat successful. She shows no sign of suicidal or homicidal ideations. Ashley Acevedo reports stable mood.  At risk for constipation. Ashley Acevedo is at increased risk for constipation due to use of GLP-1 for treatment of obesity. Ashley Acevedo denies hard, infrequent stools currently.   Assessment/Plan:   Vitamin D deficiency. Low Vitamin D level contributes to fatigue and are associated with obesity, breast, and colon cancer. She was given a refill on her Vitamin D, Ergocalciferol, (DRISDOL) 1.25 MG (50000 UNIT) CAPS capsule every week #4 with 0 refills and will follow-up for routine testing of Vitamin D every 3 months.   Mixed hyperlipidemia. Cardiovascular risk and specific lipid/LDL goals reviewed.  We discussed several lifestyle modifications today and Ashley Acevedo will continue to work on diet, exercise and weight loss efforts. Orders and follow up as documented in patient record. She will continue to follow the Category 2 meal plan as directed.   Counseling Intensive lifestyle modifications are the first line treatment for this issue.  Dietary changes: Increase soluble fiber. Decrease simple carbohydrates.  Exercise changes: Moderate to vigorous-intensity aerobic activity 150 minutes  per week if tolerated.  Lipid-lowering medications: see documented in medical record.  Insulin resistance. Ashley Acevedo will continue to work on weight loss, exercise, and decreasing simple carbohydrates to help decrease the risk of diabetes. Ashley Acevedo agreed to follow-up with [place] as directed to closely monitor her progress. She will continue to  follow the Category 2 meal plan and regular exercise.  Other depression, with emotional eating. Behavior modification techniques were discussed today to help Ashley Acevedo deal with her emotional/non-hunger eating behaviors.  Orders and follow up as documented in patient record. Refill was given for escitalopram (LEXAPRO) 10 MG tablet daily #60 with 0 refills. She was instructed to avoid excessive NSAID use, At risk for constipation. Ashley Acevedo was given approximately 15 minutes of counseling today regarding prevention of constipation. She was encouraged to increase water and fiber intake.   Class 3 severe obesity with serious comorbidity and body mass index (BMI) of 45.0 to 49.9 in adult, unspecified obesity type (HCC). Refill was given for Liraglutide -Weight Management (SAXENDA) 18 MG/3ML SOPN 1.2 mg daily #5 pens. Ashley Acevedo denies difficulties swallowing or hoarseness. She denies family history of MTC.  Ashley Acevedo is currently in the action stage of change. As such, her goal is to continue with weight loss efforts. She has agreed to the Category 2 Plan.   Exercise goals: Nyelah will continue her current exercise regimen of [employer] and walking puppy 60 minutes 1-2 times per week.   Behavioral modification strategies: increasing lean protein intake, increasing water intake, increasing high fiber foods, meal planning and cooking strategies and planning for success.  Ashley Acevedo has agreed to follow-up with our clinic in 2 weeks. She was informed of the importance of frequent follow-up visits to maximize her success with intensive lifestyle modifications for her multiple health conditions.   Objective:   Blood pressure 110/68, pulse 91, temperature 98 F (36.7 C), temperature source Oral, height 5\' 8"  (1.727 m), weight (!) 311 lb (141.1 kg), SpO2 96 %. Body mass index is 47.29 kg/m.  General: Cooperative, alert, well developed, in no acute distress. HEENT: Conjunctivae and lids unremarkable. Cardiovascular: Regular  rhythm.  Lungs: Normal work of breathing. Neurologic: No focal deficits.   Lab Results  Component Value Date   CREATININE 0.65 04/11/2020   BUN 14 04/11/2020   NA 141 04/11/2020   K 4.3 04/11/2020   CL 105 04/11/2020   CO2 23 04/11/2020   Lab Results  Component Value Date   ALT 16 04/11/2020   AST 13 04/11/2020   ALKPHOS 78 04/11/2020   BILITOT <0.2 04/11/2020   Lab Results  Component Value Date   HGBA1C 5.1 04/11/2020   HGBA1C 4.9 08/24/2019   Lab Results  Component Value Date   INSULIN 26.0 (H) 04/11/2020   INSULIN 23.6 01/02/2020   INSULIN 27.6 (H) 08/24/2019   No results found for: TSH Lab Results  Component Value Date   CHOL 240 (H) 04/11/2020   HDL 87 04/11/2020   LDLCALC 127 (H) 04/11/2020   TRIG 149 04/11/2020   CHOLHDL 2.7 01/02/2020   Lab Results  Component Value Date   WBC 7.8 04/11/2020   HGB 12.3 04/11/2020   HCT 38.2 04/11/2020   MCV 88 04/11/2020   PLT 300 04/11/2020   Lab Results  Component Value Date   IRON 78 04/11/2020   TIBC 371 04/11/2020   FERRITIN 29 04/11/2020   Attestation Statements:   Reviewed by clinician on day of visit: allergies, medications, problem list, medical history, surgical history, family history,  social history, and previous encounter notes.  I, Marianna Payment, am acting as Energy manager for [employer], NP-C   I have reviewed the above documentation for accuracy and completeness, and I agree with the above. -  Julaine Fusi, NP

## 2020-06-13 ENCOUNTER — Encounter (INDEPENDENT_AMBULATORY_CARE_PROVIDER_SITE_OTHER): Payer: Self-pay | Admitting: Family Medicine

## 2020-06-19 ENCOUNTER — Encounter: Payer: Self-pay | Admitting: Physician Assistant

## 2020-06-21 DIAGNOSIS — Z20822 Contact with and (suspected) exposure to covid-19: Secondary | ICD-10-CM | POA: Diagnosis not present

## 2020-06-22 DIAGNOSIS — Z20822 Contact with and (suspected) exposure to covid-19: Secondary | ICD-10-CM | POA: Diagnosis not present

## 2020-07-03 ENCOUNTER — Other Ambulatory Visit: Payer: Self-pay

## 2020-07-03 ENCOUNTER — Ambulatory Visit (INDEPENDENT_AMBULATORY_CARE_PROVIDER_SITE_OTHER): Payer: BC Managed Care – PPO | Admitting: Family Medicine

## 2020-07-03 ENCOUNTER — Encounter (INDEPENDENT_AMBULATORY_CARE_PROVIDER_SITE_OTHER): Payer: Self-pay | Admitting: Family Medicine

## 2020-07-03 VITALS — BP 111/68 | HR 87 | Temp 98.0°F | Ht 68.0 in | Wt 315.0 lb

## 2020-07-03 DIAGNOSIS — E559 Vitamin D deficiency, unspecified: Secondary | ICD-10-CM | POA: Diagnosis not present

## 2020-07-03 DIAGNOSIS — E782 Mixed hyperlipidemia: Secondary | ICD-10-CM

## 2020-07-03 DIAGNOSIS — E8881 Metabolic syndrome: Secondary | ICD-10-CM

## 2020-07-03 DIAGNOSIS — Z6841 Body Mass Index (BMI) 40.0 and over, adult: Secondary | ICD-10-CM

## 2020-07-03 MED ORDER — VITAMIN D (ERGOCALCIFEROL) 1.25 MG (50000 UNIT) PO CAPS: 50000.0000 [IU] | ORAL_CAPSULE | ORAL | 0 refills | Status: DC

## 2020-07-04 MED ORDER — SAXENDA 18 MG/3ML ~~LOC~~ SOPN: 3.0000 mg | PEN_INJECTOR | Freq: Every day | SUBCUTANEOUS | 0 refills | Status: DC

## 2020-07-04 NOTE — Progress Notes (Signed)
Chief Complaint:   OBESITY Ashley Acevedo is here to discuss her progress with her obesity treatment plan along with follow-up of her obesity related diagnoses. Ashley Acevedo is on the Category 2 Plan and states she is following her eating plan approximately 50% of the time. Ashley Acevedo states she is doing [employer] for 60 minutes 1 times per week.  She has also increased her walking.  Today's visit was #: 16 Starting weight: 295 lbs Starting date: 08/24/2019 Today's weight: 315 lbs Today's date: 07/03/2020 Total lbs lost since last in-office visit: 0  Interim History: Ashley Acevedo says that Ashley Acevedo is not covered now.  She is expected to have coverage for it again in January. Struggles:  Nighttime hunger.  Discussed topiramate, but will hold.  She will increase her protein intake at dinner.  She will also take her puppy out for walks after dinner.  Assessment/Plan:   1. Insulin resistance Not optimized. Goal is HgbA1c < 5.7 and insulin level closer to 5. Stefani will continue to work on weight loss, exercise, and decreasing simple carbohydrates.  Lab Results  Component Value Date   INSULIN 26.0 (H) 04/11/2020   INSULIN 23.6 01/02/2020   INSULIN 27.6 (H) 08/24/2019   Lab Results  Component Value Date   HGBA1C 5.1 04/11/2020   2. Mixed hyperlipidemia Not at goal. Cardiovascular risk and specific lipid/LDL goals reviewed.  We discussed several lifestyle modifications today and Ashley Acevedo will continue to work on diet, exercise and weight loss efforts.   Lab Results  Component Value Date   ALT 16 04/11/2020   AST 13 04/11/2020   ALKPHOS 78 04/11/2020   BILITOT <0.2 04/11/2020   Lab Results  Component Value Date   CHOL 240 (H) 04/11/2020   HDL 87 04/11/2020   LDLCALC 127 (H) 04/11/2020   TRIG 149 04/11/2020   CHOLHDL 2.7 01/02/2020   3. Vitamin D deficiency Current vitamin D is 30.8, tested on 04/11/2020. Not at goal. Optimal goal > 50 ng/dL. There is also evidence to support a goal of >70 ng/dL in  patients with cancer and heart disease. Plan: Continue Vitamin D @50 ,000 IU every week with follow-up for routine testing of Vitamin D at least 2-3 times per year to avoid over-replacement.  Will refill today.  This was not requested, but I will be out prior to her next visit.  -Refill Vitamin D, Ergocalciferol, (DRISDOL) 1.25 MG (50000 UNIT) CAPS capsule; Take 1 capsule (50,000 Units total) by mouth every 7 (seven) days.  Dispense: 4 capsule; Refill: 0  4. Class 3 severe obesity with serious comorbidity and body mass index (BMI) of 45.0 to 49.9 in adult, unspecified obesity type [facility])  Ashley Acevedo is currently in the action stage of change. As such, her goal is to continue with weight loss efforts. She has agreed to the Category 2 Plan.   Exercise goals: For substantial health benefits, adults should do at least 150 minutes (2 hours and 30 minutes) a week of moderate-intensity, or 75 minutes (1 hour and 15 minutes) a week of vigorous-intensity aerobic physical activity, or an equivalent combination of moderate- and vigorous-intensity aerobic activity. Aerobic activity should be performed in episodes of at least 10 minutes, and preferably, it should be spread throughout the week.  Behavioral modification strategies: increasing lean protein intake, decreasing simple carbohydrates, increasing vegetables and increasing water intake.  Ashley Acevedo has agreed to follow-up with our clinic in 2-3 weeks. She was informed of the importance of frequent follow-up visits to maximize her success  with intensive lifestyle modifications for her multiple health conditions.   Objective:   Blood pressure 111/68, pulse 87, temperature 98 F (36.7 C), temperature source Oral, height 5\' 8"  (1.727 m), weight (!) 315 lb (142.9 kg), SpO2 96 %. Body mass index is 47.9 kg/m.  General: Cooperative, alert, well developed, in no acute distress. HEENT: Conjunctivae and lids unremarkable. Cardiovascular: Regular rhythm.  Lungs: Normal  work of breathing. Neurologic: No focal deficits.   Lab Results  Component Value Date   CREATININE 0.65 04/11/2020   BUN 14 04/11/2020   NA 141 04/11/2020   K 4.3 04/11/2020   CL 105 04/11/2020   CO2 23 04/11/2020   Lab Results  Component Value Date   ALT 16 04/11/2020   AST 13 04/11/2020   ALKPHOS 78 04/11/2020   BILITOT <0.2 04/11/2020   Lab Results  Component Value Date   HGBA1C 5.1 04/11/2020   HGBA1C 4.9 08/24/2019   Lab Results  Component Value Date   INSULIN 26.0 (H) 04/11/2020   INSULIN 23.6 01/02/2020   INSULIN 27.6 (H) 08/24/2019   Lab Results  Component Value Date   CHOL 240 (H) 04/11/2020   HDL 87 04/11/2020   LDLCALC 127 (H) 04/11/2020   TRIG 149 04/11/2020   CHOLHDL 2.7 01/02/2020   Lab Results  Component Value Date   WBC 7.8 04/11/2020   HGB 12.3 04/11/2020   HCT 38.2 04/11/2020   MCV 88 04/11/2020   PLT 300 04/11/2020   Lab Results  Component Value Date   IRON 78 04/11/2020   TIBC 371 04/11/2020   FERRITIN 29 04/11/2020   Attestation Statements:   Reviewed by clinician on day of visit: allergies, medications, problem list, medical history, surgical history, family history, social history, and previous encounter notes.  I, 04/13/2020, CMA, am acting as transcriptionist for Insurance claims handler, DO  I have reviewed the above documentation for accuracy and completeness, and I agree with the above. Helane Rima, DO

## 2020-07-04 NOTE — Addendum Note (Signed)
Addended by: Scarlett Presto on: 07/04/2020 01:52 PM   Modules accepted: Orders

## 2020-07-15 ENCOUNTER — Encounter: Payer: Self-pay | Admitting: Physical Therapy

## 2020-07-17 ENCOUNTER — Ambulatory Visit (INDEPENDENT_AMBULATORY_CARE_PROVIDER_SITE_OTHER): Payer: BC Managed Care – PPO | Admitting: Adult Health

## 2020-07-25 ENCOUNTER — Ambulatory Visit (INDEPENDENT_AMBULATORY_CARE_PROVIDER_SITE_OTHER): Payer: BC Managed Care – PPO | Admitting: Adult Health

## 2020-07-25 ENCOUNTER — Other Ambulatory Visit: Payer: Self-pay

## 2020-07-25 ENCOUNTER — Encounter: Payer: Self-pay | Admitting: Physician Assistant

## 2020-07-25 ENCOUNTER — Encounter (INDEPENDENT_AMBULATORY_CARE_PROVIDER_SITE_OTHER): Payer: Self-pay | Admitting: Adult Health

## 2020-07-25 VITALS — BP 116/76 | HR 75 | Temp 98.8°F | Ht 68.0 in | Wt 314.0 lb

## 2020-07-25 DIAGNOSIS — E782 Mixed hyperlipidemia: Secondary | ICD-10-CM | POA: Diagnosis not present

## 2020-07-25 DIAGNOSIS — Z6841 Body Mass Index (BMI) 40.0 and over, adult: Secondary | ICD-10-CM

## 2020-07-25 DIAGNOSIS — E559 Vitamin D deficiency, unspecified: Secondary | ICD-10-CM

## 2020-07-25 DIAGNOSIS — E66813 Obesity, class 3: Secondary | ICD-10-CM

## 2020-07-25 DIAGNOSIS — Z9189 Other specified personal risk factors, not elsewhere classified: Secondary | ICD-10-CM

## 2020-07-25 DIAGNOSIS — E8881 Metabolic syndrome: Secondary | ICD-10-CM | POA: Diagnosis not present

## 2020-07-25 DIAGNOSIS — E88819 Insulin resistance, unspecified: Secondary | ICD-10-CM

## 2020-07-25 MED ORDER — VITAMIN D (ERGOCALCIFEROL) 1.25 MG (50000 UNIT) PO CAPS: 50000.0000 [IU] | ORAL_CAPSULE | ORAL | 0 refills | Status: DC

## 2020-07-25 MED ORDER — ALBUTEROL SULFATE HFA 108 (90 BASE) MCG/ACT IN AERS: 2.0000 | INHALATION_SPRAY | Freq: Four times a day (QID) | RESPIRATORY_TRACT | 3 refills | Status: DC | PRN

## 2020-07-25 NOTE — Progress Notes (Signed)
Chief Complaint:   OBESITY Ashley Acevedo is here to discuss her progress with her obesity treatment plan along with follow-up of her obesity related diagnoses. [name] is on the Category 2 Plan and states she is following her eating plan approximately 65% of the time. [name] states she is doing cardio 60 minutes 2 times per week.  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 She has several hikes and physical activities planned during the 4-day visit. The group will be eating out almost all meals.  She will try to follow [place], remain well hydrated with water, and be active as possible while on the trip.  Subjective:   Vitamin D deficiency. Vitamin D level on 04/11/2020 was 30.8, which is below goal of 50. [name] is on [employer]. No nausea, vomiting, or muscle weakness.    Ref. Range 04/11/2020 13:25  Vitamin D, 25-Hydroxy Latest Ref Range: 30.0 - 100.0 ng/mL 30.8   Mixed hyperlipidemia. Ashley Acevedo has hyperlipidemia and has been trying to improve her cholesterol levels with intensive lifestyle modification including a low saturated fat diet, exercise and weight loss. She denies any chest pain, claudication or myalgias. 04/11/2020 lipid panel showed total and LDL cholesterol levels above goal. [name] is not on a statin.  Lab Results  Component Value Date   ALT 16 04/11/2020   AST 13 04/11/2020   ALKPHOS 78 04/11/2020   BILITOT <0.2 04/11/2020   Lab Results  Component Value Date   CHOL 240 (H) 04/11/2020   HDL 87 04/11/2020   LDLCALC 127 (H) 04/11/2020   TRIG 149 04/11/2020   CHOLHDL 2.7 01/02/2020   Insulin resistance. Ashley Acevedo has a diagnosis of insulin resistance based on her elevated fasting insulin level >5. She  continues to work on diet and exercise to decrease her risk of diabetes. 04/11/2020 blood glucose 84, A1c 5.1 with an insulin level of 26.0. Ashley Acevedo is not on metformin and denies polyphagia.  Lab Results  Component Value Date   INSULIN 26.0 (H) 04/11/2020   INSULIN 23.6 01/02/2020   INSULIN 27.6 (H) 08/24/2019   Lab Results  Component Value Date   HGBA1C 5.1 04/11/2020   At risk for heart disease. Ashley Acevedo is at a higher than average risk for cardiovascular disease due to hyperlipidemia and obesity.   Assessment/Plan:   Vitamin D deficiency. Low Vitamin D level contributes to fatigue and are associated with obesity, breast, and colon cancer. She was given a refill on her Vitamin D, Ergocalciferol, (DRISDOL) 1.25 MG (50000 UNIT) CAPS capsule every week #4 with 0 refills and VITAMIN D 25 Hydroxy (Vit-D Deficiency, Fractures) level will be checked today.   Mixed hyperlipidemia. Cardiovascular risk and specific lipid/LDL goals reviewed.  We discussed several lifestyle modifications today and [name] will continue to work on diet, exercise and weight loss efforts. Orders and follow up as documented in patient record. She will reduce saturated fat in her diet. Labs will be checked today.  Counseling Intensive lifestyle modifications are the first line treatment for this issue. . Dietary changes: Increase soluble fiber. Decrease simple carbohydrates. . Exercise changes: Moderate to vigorous-intensity aerobic activity 150 minutes per week if tolerated. . Lipid-lowering medications: see documented in medical record.  Insulin resistance. [name] will continue  to work on weight loss, exercise, and decreasing simple carbohydrates to help decrease the risk of diabetes. [name] agreed to follow-up with [place] as directed to closely monitor her progress. Labs will be checked today.  At risk for heart disease. Ashley Acevedo was given approximately 15 minutes of coronary artery disease prevention counseling today. She is 24  y.o. female and has risk factors for heart disease including obesity. We discussed intensive lifestyle modifications today with an emphasis on specific weight loss instructions and strategies.   Repetitive spaced learning was employed today to elicit superior memory formation and behavioral change.  Class 3 severe obesity with serious comorbidity and body mass index (BMI) of 45.0 to 49.9 in adult, unspecified obesity type (HCC).  Ashley Acevedo is currently in the action stage of change. As such, her goal is to continue with weight loss efforts. She has agreed to keeping a food journal and adhering to recommended goals of 1200-1300 calories and 85 grams of protein daily; lunch 300-400 calories and 30 grams of protein; dinner 400-500 calories and 35 grams of protein.   Exercise goals: Ashley Acevedo will continue [employer] for exercise.  Behavioral modification strategies: increasing lean protein intake, decreasing simple carbohydrates, increasing water intake, meal planning and cooking strategies, better snacking choices, travel eating strategies, celebration eating strategies, planning for success and keeping a strict food journal.  Ashley Acevedo has agreed to follow-up with our clinic in 3 weeks. She was informed of the importance of frequent follow-up visits to maximize her success with intensive lifestyle modifications for her multiple health conditions.   Ashley Acevedo was informed we would discuss her lab results at her next visit unless there is a critical issue that needs to be addressed sooner. Ashley Acevedo agreed to keep her next visit at the agreed upon time to discuss these results.  Objective:   Blood pressure 116/76, pulse 75, temperature 98.8 F (37.1 C), height 5\' 8"  (1.727 m), weight (!) 314 lb (142.4 kg), SpO2 98 %. Body mass index is 47.74 kg/m.  General: Cooperative, alert, well developed, in no acute distress. HEENT: Conjunctivae and lids unremarkable. Cardiovascular: Regular rhythm.  Lungs: Normal work of  breathing. Neurologic: No focal deficits.   Lab Results  Component Value Date   CREATININE 0.65 04/11/2020   BUN 14 04/11/2020   NA 141 04/11/2020   K 4.3 04/11/2020   CL 105 04/11/2020   CO2 23 04/11/2020   Lab Results  Component Value Date   ALT 16 04/11/2020   AST 13 04/11/2020   ALKPHOS 78 04/11/2020   BILITOT <0.2 04/11/2020   Lab Results  Component Value Date   HGBA1C 5.1 04/11/2020   HGBA1C 4.9 08/24/2019   Lab Results  Component Value Date   INSULIN 26.0 (H) 04/11/2020   INSULIN 23.6 01/02/2020   INSULIN 27.6 (H) 08/24/2019   No results found for: TSH Lab Results  Component Value Date   CHOL 240 (H) 04/11/2020   HDL 87 04/11/2020   LDLCALC 127 (H) 04/11/2020   TRIG 149 04/11/2020   CHOLHDL 2.7 01/02/2020   Lab Results  Component Value Date   WBC 7.8 04/11/2020   HGB 12.3 04/11/2020   HCT 38.2 04/11/2020   MCV 88 04/11/2020   PLT 300 04/11/2020   Lab Results  Component Value Date   IRON 78 04/11/2020   TIBC 371 04/11/2020   FERRITIN 29 04/11/2020   Attestation Statements:   Reviewed by clinician on day of visit: allergies, medications, problem list, medical history, surgical history, family history,  social history, and previous encounter notes.  I, Marianna Payment, am acting as Energy manager for [employer], NP-C   I have reviewed the above documentation for accuracy and completeness, and I agree with the above. -  Jarom Govan d. Raena Pau, NP-C

## 2020-07-26 LAB — HEMOGLOBIN A1C
Est. average glucose Bld gHb Est-mCnc: 105 mg/dL
Hgb A1c MFr Bld: 5.3 % (ref 4.8–5.6)

## 2020-07-26 LAB — COMPREHENSIVE METABOLIC PANEL
ALT: 13 IU/L (ref 0–32)
AST: 13 IU/L (ref 0–40)
Albumin/Globulin Ratio: 1.7 (ref 1.2–2.2)
Albumin: 4 g/dL (ref 3.9–5.0)
Alkaline Phosphatase: 85 IU/L (ref 44–121)
BUN/Creatinine Ratio: 20 (ref 9–23)
BUN: 14 mg/dL (ref 6–20)
Bilirubin Total: 0.2 mg/dL (ref 0.0–1.2)
CO2: 24 mmol/L (ref 20–29)
Calcium: 9.4 mg/dL (ref 8.7–10.2)
Chloride: 105 mmol/L (ref 96–106)
Creatinine, Ser: 0.69 mg/dL (ref 0.57–1.00)
GFR calc Af Amer: 139 mL/min/{1.73_m2} (ref >59)
GFR calc non Af Amer: 121 mL/min/{1.73_m2} (ref >59)
Globulin, Total: 2.4 g/dL (ref 1.5–4.5)
Glucose: 86 mg/dL (ref 65–99)
Potassium: 3.9 mmol/L (ref 3.5–5.2)
Sodium: 143 mmol/L (ref 134–144)
Total Protein: 6.4 g/dL (ref 6.0–8.5)

## 2020-07-26 LAB — LIPID PANEL
Chol/HDL Ratio: 3.2 ratio (ref 0.0–4.4)
Cholesterol, Total: 221 mg/dL — ABNORMAL HIGH (ref 100–199)
HDL: 70 mg/dL (ref >39)
LDL Chol Calc (NIH): 124 mg/dL — ABNORMAL HIGH (ref 0–99)
Triglycerides: 157 mg/dL — ABNORMAL HIGH (ref 0–149)
VLDL Cholesterol Cal: 27 mg/dL (ref 5–40)

## 2020-07-26 LAB — INSULIN, RANDOM: INSULIN: 26 u[IU]/mL — ABNORMAL HIGH (ref 2.6–24.9)

## 2020-07-26 LAB — VITAMIN D 25 HYDROXY (VIT D DEFICIENCY, FRACTURES): Vit D, 25-Hydroxy: 38.9 ng/mL (ref 30.0–100.0)

## 2020-07-29 ENCOUNTER — Other Ambulatory Visit (INDEPENDENT_AMBULATORY_CARE_PROVIDER_SITE_OTHER): Payer: Self-pay | Admitting: Family Medicine

## 2020-07-29 DIAGNOSIS — E559 Vitamin D deficiency, unspecified: Secondary | ICD-10-CM

## 2020-08-15 ENCOUNTER — Ambulatory Visit (INDEPENDENT_AMBULATORY_CARE_PROVIDER_SITE_OTHER): Payer: BC Managed Care – PPO | Admitting: Adult Health

## 2020-08-22 ENCOUNTER — Other Ambulatory Visit: Payer: Self-pay

## 2020-08-22 ENCOUNTER — Ambulatory Visit (INDEPENDENT_AMBULATORY_CARE_PROVIDER_SITE_OTHER): Payer: BC Managed Care – PPO | Admitting: Adult Health

## 2020-08-22 ENCOUNTER — Encounter (INDEPENDENT_AMBULATORY_CARE_PROVIDER_SITE_OTHER): Payer: Self-pay | Admitting: Adult Health

## 2020-08-22 VITALS — BP 131/82 | HR 80 | Temp 97.8°F | Ht 68.0 in | Wt 318.0 lb

## 2020-08-22 DIAGNOSIS — E782 Mixed hyperlipidemia: Secondary | ICD-10-CM | POA: Diagnosis not present

## 2020-08-22 DIAGNOSIS — Z6841 Body Mass Index (BMI) 40.0 and over, adult: Secondary | ICD-10-CM

## 2020-08-22 DIAGNOSIS — E8881 Metabolic syndrome: Secondary | ICD-10-CM | POA: Diagnosis not present

## 2020-08-22 DIAGNOSIS — Z9189 Other specified personal risk factors, not elsewhere classified: Secondary | ICD-10-CM

## 2020-08-22 DIAGNOSIS — E559 Vitamin D deficiency, unspecified: Secondary | ICD-10-CM

## 2020-08-22 MED ORDER — VITAMIN D (ERGOCALCIFEROL) 1.25 MG (50000 UNIT) PO CAPS: 50000.0000 [IU] | ORAL_CAPSULE | ORAL | 0 refills | Status: DC

## 2020-08-22 NOTE — Progress Notes (Signed)
Chief Complaint:   OBESITY Ashley Acevedo is here to discuss her progress with her obesity treatment plan along with follow-up of her obesity related diagnoses. Ashley Acevedo is on the Category 2 Plan and states she is following her eating plan approximately 0% of the time. Ashley Acevedo states she is exercising 0 minutes 0 times per week.  Today's visit was #: 18 Starting weight: 295 lbs Starting date: 08/24/2019 Today's weight: 318 lbs Today's date: 08/22/2020 Total lbs lost to date: 0 Total lbs lost since last in-office visit: 0  Interim History: Ashley Acevedo recently became engaged -  Congratulations! She enjoyed pizza and cocktails while celebrating at  [facility] with her fiance for several days after the engagement. She has yet to start journaling due to travel schedule, but is ready to begin now.  Subjective:   Mixed hyperlipidemia. Ashley Acevedo has hyperlipidemia and has been trying to improve her cholesterol levels with intensive lifestyle modification including a low saturated fat diet, exercise and weight loss. She denies any chest pain, claudication or myalgias. Lipid panel on 07/25/2020 showed total cholesterol and LDL levels both still above goal, however, did decrease from previous check. Triglycerides were increased from previous check. CMP on 07/25/2020 was stable.  Lab Results  Component Value Date   ALT 13 07/25/2020   AST 13 07/25/2020   ALKPHOS 85 07/25/2020   BILITOT 0.2 07/25/2020   Lab Results  Component Value Date   CHOL 221 (H) 07/25/2020   HDL 70 07/25/2020   LDLCALC 124 (H) 07/25/2020   TRIG 157 (H) 07/25/2020   CHOLHDL 3.2 07/25/2020   Vitamin D deficiency. Vitamin D level on 07/25/2020 was 38.9, improved from previous check, however, still below goal of 50. Ashley Acevedo is on [employer]. No nausea, vomiting, or muscle weakness.    Ref. Range 07/25/2020 08:37  Vitamin D, 25-Hydroxy Latest Ref Range: 30.0 - 100.0 ng/mL 38.9   Insulin resistance. Ashley Acevedo has a  diagnosis of insulin resistance based on her elevated fasting insulin level >5. She continues to work on diet and exercise to decrease her risk of diabetes. 07/25/2020 insulin level 26.0, not improved from last check. Normal blood glucose and A1c levels. CMP on 07/25/2020 was stable.  Lab Results  Component Value Date   INSULIN 26.0 (H) 07/25/2020   INSULIN 26.0 (H) 04/11/2020   INSULIN 23.6 01/02/2020   INSULIN 27.6 (H) 08/24/2019   Lab Results  Component Value Date   HGBA1C 5.3 07/25/2020   At risk for heart disease. Ashley Acevedo is at a higher than average risk for cardiovascular disease due to hyperlipidemia and obesity.   Assessment/Plan:   Mixed hyperlipidemia. Cardiovascular risk and specific lipid/LDL goals reviewed.  We discussed several lifestyle modifications today and Ashley Acevedo will continue to work on diet, increasing regular exercise, and weight loss efforts. Orders and follow up as documented in patient record. She will continue to decrease saturated intake.  Counseling Intensive lifestyle modifications are the first line treatment for this issue. . Dietary changes: Increase soluble fiber. Decrease simple carbohydrates. . Exercise changes: Moderate to vigorous-intensity aerobic activity 150 minutes per week if tolerated. . Lipid-lowering medications: see documented in medical record.  Vitamin D deficiency. Low Vitamin D level contributes to fatigue and are associated with obesity, breast, and colon cancer. She was given a refill on her Vitamin D, Ergocalciferol, (DRISDOL) 1.25 MG (50000 UNIT) CAPS capsule every week #4 with 0 refills and will follow-up for routine testing of Vitamin D, at least 2-3 times per  year to avoid over-replacement.   Insulin resistance. Ashley Acevedo will continue to work on weight loss, exercise, increasing protein, and decreasing simple carbohydrates to help decrease the risk of diabetes. Ashley Acevedo agreed to follow-up with [place] as directed to closely monitor her progress.     At risk for heart disease. Ashley Acevedo was given approximately 15 minutes of coronary artery disease prevention counseling today. She is 24 y.o. female and has risk factors for heart disease including obesity. We discussed intensive lifestyle modifications today with an emphasis on specific weight loss instructions and strategies.   Repetitive spaced learning was employed today to elicit superior memory formation and behavioral change.  Class 3 severe obesity with serious comorbidity and body mass index (BMI) of 45.0 to 49.9 in adult, unspecified obesity type (HCC).   Ashley Acevedo is currently in the action stage of change. As such, her goal is to continue with weight loss efforts. She has agreed to keeping a food journal and adhering to recommended goals of 1200-1300 calories and 85 grams of protein daily.   Handouts were provided on Eating Out Guide and Multiple Recipes.  Exercise goals: Ashley Acevedo will increase her daily walking.  Behavioral modification strategies: increasing lean protein intake, increasing water intake, no skipping meals, meal planning and cooking strategies, holiday eating strategies , celebration eating strategies and planning for success.  Ashley Acevedo has agreed to follow-up with our clinic in 3-4 weeks. She was informed of the importance of frequent follow-up visits to maximize her success with intensive lifestyle modifications for her multiple health conditions.   Objective:   Blood pressure 131/82, pulse 80, temperature 97.8 F (36.6 C), height 5\' 8"  (1.727 m), weight (!) 318 lb (144.2 kg), SpO2 97 %. Body mass index is 48.35 kg/m.  General: Cooperative, alert, well developed, in no acute distress. HEENT: Conjunctivae and lids unremarkable. Cardiovascular: Regular rhythm.  Lungs: Normal work of breathing. Neurologic: No focal deficits.   Lab Results  Component Value Date   CREATININE 0.69 07/25/2020   BUN 14 07/25/2020   NA 143 07/25/2020   K 3.9 07/25/2020   CL 105  07/25/2020   CO2 24 07/25/2020   Lab Results  Component Value Date   ALT 13 07/25/2020   AST 13 07/25/2020   ALKPHOS 85 07/25/2020   BILITOT 0.2 07/25/2020   Lab Results  Component Value Date   HGBA1C 5.3 07/25/2020   HGBA1C 5.1 04/11/2020   HGBA1C 4.9 08/24/2019   Lab Results  Component Value Date   INSULIN 26.0 (H) 07/25/2020   INSULIN 26.0 (H) 04/11/2020   INSULIN 23.6 01/02/2020   INSULIN 27.6 (H) 08/24/2019   No results found for: TSH Lab Results  Component Value Date   CHOL 221 (H) 07/25/2020   HDL 70 07/25/2020   LDLCALC 124 (H) 07/25/2020   TRIG 157 (H) 07/25/2020   CHOLHDL 3.2 07/25/2020   Lab Results  Component Value Date   WBC 7.8 04/11/2020   HGB 12.3 04/11/2020   HCT 38.2 04/11/2020   MCV 88 04/11/2020   PLT 300 04/11/2020   Lab Results  Component Value Date   IRON 78 04/11/2020   TIBC 371 04/11/2020   FERRITIN 29 04/11/2020   Attestation Statements:   Reviewed by clinician on day of visit: allergies, medications, problem list, medical history, surgical history, family history, social history, and previous encounter notes.  I07/11/2019, am acting as Marianna Payment for Energy manager, NP-C   I have reviewed the above documentation for accuracy and completeness, and I  agree with the above. -  Sandee Bernath d. Danfrod, NP-C

## 2020-09-12 ENCOUNTER — Ambulatory Visit (INDEPENDENT_AMBULATORY_CARE_PROVIDER_SITE_OTHER): Payer: BC Managed Care – PPO | Admitting: Adult Health

## 2020-09-13 ENCOUNTER — Other Ambulatory Visit (INDEPENDENT_AMBULATORY_CARE_PROVIDER_SITE_OTHER): Payer: Self-pay | Admitting: Adult Health

## 2020-09-13 DIAGNOSIS — F3289 Other specified depressive episodes: Secondary | ICD-10-CM

## 2020-09-24 ENCOUNTER — Other Ambulatory Visit: Payer: Self-pay

## 2020-09-24 ENCOUNTER — Encounter (INDEPENDENT_AMBULATORY_CARE_PROVIDER_SITE_OTHER): Payer: Self-pay | Admitting: Adult Health

## 2020-09-24 ENCOUNTER — Ambulatory Visit (INDEPENDENT_AMBULATORY_CARE_PROVIDER_SITE_OTHER): Payer: BC Managed Care – PPO | Admitting: Adult Health

## 2020-09-24 VITALS — BP 121/87 | HR 82 | Temp 98.1°F | Ht 68.0 in | Wt 323.0 lb

## 2020-09-24 DIAGNOSIS — E8881 Metabolic syndrome: Secondary | ICD-10-CM

## 2020-09-24 DIAGNOSIS — E559 Vitamin D deficiency, unspecified: Secondary | ICD-10-CM

## 2020-09-24 DIAGNOSIS — Z9189 Other specified personal risk factors, not elsewhere classified: Secondary | ICD-10-CM

## 2020-09-24 DIAGNOSIS — Z6841 Body Mass Index (BMI) 40.0 and over, adult: Secondary | ICD-10-CM | POA: Diagnosis not present

## 2020-09-24 MED ORDER — VITAMIN D (ERGOCALCIFEROL) 1.25 MG (50000 UNIT) PO CAPS: 50000.0000 [IU] | ORAL_CAPSULE | ORAL | 0 refills | Status: DC

## 2020-09-24 NOTE — Progress Notes (Signed)
Chief Complaint:   OBESITY Anselma Shawnie Pons is here to discuss her progress with her obesity treatment plan along with follow-up of her obesity related diagnoses. 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 Ceceilia states she is walking 45 minutes 2 times per week.  Today's visit was #: 19 Starting weight: 295 lbs Starting date: 08/24/2019 Today's weight: 323 lbs Today's date: 09/24/2020 Total lbs lost to date: 0 Total lbs lost since last in-office visit: 0  Interim History: Cheral has set her wedding date to 06/24/2021. Her ultimate goal is to enter her marriage healthier.  Her current insurance does not cover Saxenda. Kielee was previously on GLP-1 earlier this year and tolerated it well. She will start Saxenda after the New Year with new insurance.  Subjective:   Vitamin D deficiency. Vitamin D level on 07/25/2020 was 38.9, below goal of 50. Ohanna is on [employer]. No nausea, vomiting, or muscle weakness.    Ref. Range 07/25/2020 08:37  Vitamin D, 25-Hydroxy Latest Ref Range: 30.0 - 100.0 ng/mL 38.9   Insulin resistance. Marianita has a diagnosis of insulin resistance based on her elevated fasting insulin level >5. She continues to work on diet and exercise to decrease her risk of diabetes. 07/25/2020 blood glucose 86, A1c 5.3 with an insulin level of 26.0. 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  Lab Results  Component Value Date   INSULIN 26.0 (H) 07/25/2020   INSULIN 26.0 (H) 04/11/2020   INSULIN 23.6 01/02/2020   INSULIN 27.6 (H) 08/24/2019   Lab Results  Component Value Date   HGBA1C 5.3 07/25/2020   At risk for diabetes mellitus. Davinity is at higher than average risk for developing diabetes due to insulin resistance and obesity.   Assessment/Plan:   Vitamin D deficiency. Low Vitamin D  level contributes to fatigue and are associated with obesity, breast, and colon cancer. She was given a refill on her Vitamin D, Ergocalciferol, (DRISDOL) 1.25 MG (50000 UNIT) CAPS capsule every week #4 with 0 refills and will follow-up for routine testing of Vitamin D, at least 2-3 times per year to avoid over-replacement.   Insulin resistance. Sujata will continue to work on weight loss, increasing exercise, increasing protein, and decreasing simple carbohydrates to help decrease the risk of diabetes. Naz agreed to follow-up with [place] as directed to closely monitor her progress.  At risk for diabetes mellitus. Morrissa was given approximately 15 minutes of diabetes education and counseling today. We discussed intensive lifestyle modifications today with an emphasis on weight loss as well as increasing exercise and decreasing simple carbohydrates in her diet. We also reviewed medication options with an emphasis on risk versus benefit of those discussed.   Repetitive spaced learning was employed today to elicit superior memory formation and behavioral change.  Class 3 severe obesity with serious comorbidity and body mass index (BMI) of 45.0 to 49.9 in adult, unspecified obesity type (HCC).  Jana is currently in the action stage of change. As such, her goal is to continue with weight loss efforts. She has agreed to keeping a food journal and adhering to recommended goals of 1200-1300 calories and 85 grams of protein daily.   Exercise goals: Christyanna will continue walking 45 minutes 2 times per week.  Behavioral modification strategies: increasing lean protein intake, decreasing simple carbohydrates,  increasing water intake, meal planning and cooking strategies, better snacking choices, planning for success and keeping a strict food journal.  Geniva has agreed to follow-up with our clinic in 3 weeks. She was informed of the importance of frequent follow-up visits to maximize her success with intensive  lifestyle modifications for her multiple health conditions.   Objective:   Blood pressure 121/87, pulse 82, temperature 98.1 F (36.7 C), height 5\' 8"  (1.727 m), weight (!) 323 lb (146.5 kg), SpO2 98 %. Body mass index is 49.11 kg/m.  General: Cooperative, alert, well developed, in no acute distress. HEENT: Conjunctivae and lids unremarkable. Cardiovascular: Regular rhythm.  Lungs: Normal work of breathing. Neurologic: No focal deficits.   Lab Results  Component Value Date   CREATININE 0.69 07/25/2020   BUN 14 07/25/2020   NA 143 07/25/2020   K 3.9 07/25/2020   CL 105 07/25/2020   CO2 24 07/25/2020   Lab Results  Component Value Date   ALT 13 07/25/2020   AST 13 07/25/2020   ALKPHOS 85 07/25/2020   BILITOT 0.2 07/25/2020   Lab Results  Component Value Date   HGBA1C 5.3 07/25/2020   HGBA1C 5.1 04/11/2020   HGBA1C 4.9 08/24/2019   Lab Results  Component Value Date   INSULIN 26.0 (H) 07/25/2020   INSULIN 26.0 (H) 04/11/2020   INSULIN 23.6 01/02/2020   INSULIN 27.6 (H) 08/24/2019   No results found for: TSH Lab Results  Component Value Date   CHOL 221 (H) 07/25/2020   HDL 70 07/25/2020   LDLCALC 124 (H) 07/25/2020   TRIG 157 (H) 07/25/2020   CHOLHDL 3.2 07/25/2020   Lab Results  Component Value Date   WBC 7.8 04/11/2020   HGB 12.3 04/11/2020   HCT 38.2 04/11/2020   MCV 88 04/11/2020   PLT 300 04/11/2020   Lab Results  Component Value Date   IRON 78 04/11/2020   TIBC 371 04/11/2020   FERRITIN 29 04/11/2020   Attestation Statements:   Reviewed by clinician on day of visit: allergies, medications, problem list, medical history, surgical history, family history, social history, and previous encounter notes.  I, 04/13/2020, am acting as Marianna Payment for Energy manager, NP-C   I have reviewed the above documentation for accuracy and completeness, and I agree with the above. -  Ellanie Oppedisano d. Olof Marcil, NP-C

## 2020-10-22 ENCOUNTER — Other Ambulatory Visit: Payer: Self-pay

## 2020-10-22 ENCOUNTER — Telehealth (INDEPENDENT_AMBULATORY_CARE_PROVIDER_SITE_OTHER): Payer: 59 | Admitting: Adult Health

## 2020-10-22 ENCOUNTER — Encounter (INDEPENDENT_AMBULATORY_CARE_PROVIDER_SITE_OTHER): Payer: Self-pay | Admitting: Adult Health

## 2020-10-22 DIAGNOSIS — Z6841 Body Mass Index (BMI) 40.0 and over, adult: Secondary | ICD-10-CM

## 2020-10-22 DIAGNOSIS — F3289 Other specified depressive episodes: Secondary | ICD-10-CM

## 2020-10-22 DIAGNOSIS — E8881 Metabolic syndrome: Secondary | ICD-10-CM | POA: Diagnosis not present

## 2020-10-22 DIAGNOSIS — E559 Vitamin D deficiency, unspecified: Secondary | ICD-10-CM | POA: Diagnosis not present

## 2020-10-22 MED ORDER — ESCITALOPRAM OXALATE 10 MG PO TABS: 10.0000 mg | ORAL_TABLET | Freq: Every day | ORAL | 1 refills | Status: DC

## 2020-10-22 MED ORDER — LIRAGLUTIDE 18 MG/3ML ~~LOC~~ SOPN: PEN_INJECTOR | SUBCUTANEOUS | 0 refills | Status: DC

## 2020-10-22 MED ORDER — VITAMIN D (ERGOCALCIFEROL) 1.25 MG (50000 UNIT) PO CAPS: 50000.0000 [IU] | ORAL_CAPSULE | ORAL | 0 refills | Status: DC

## 2020-10-23 NOTE — Progress Notes (Signed)
TeleHealth Visit:  Due to the COVID-19 pandemic, this visit was completed with telemedicine (audio/video) technology to reduce patient and provider exposure as well as to preserve personal protective equipment.   Ashley Acevedo has verbally consented to this TeleHealth visit. The patient is located at home, the provider is located at the [facility] office. The participants in this visit include the listed provider and patient. The visit was conducted today via video.  Chief Complaint: OBESITY Ashley Acevedo is here to discuss her progress with her obesity treatment plan along with follow-up of her obesity related diagnoses. Ashley Acevedo is on the Category 2 Plan and states she is following her eating plan approximately 60% of the time. Ashley Acevedo states she is walking 30 minutes 2 times per week.  Today's visit was #: 20 Starting weight: 295 lbs Starting date: 08/24/2019  Interim History: Ashley Acevedo reports that many of her co-workers have tested positive for COVID-19. She denies any current symptoms, but underwent SARS-Cov2 PCR testing on 10/18/2020. Her results are pending. She received Pfizer COVID-19 vaccines with booster at the end of November 2021. She would like to restart Victoza, as she was previously on it and tolerated it well. She denies family history of MTC. She denies personal history of pancreatitis.  Subjective:   1. Vitamin D deficiency Ashley Acevedo's Vitamin D level was 38.9 on 07/25/2020. She is currently taking prescription vitamin D 50,000 IU each week. She denies nausea, vomiting or muscle weakness.  Ref. Range 07/25/2020 08:37  Vitamin D, 25-Hydroxy Latest Ref Range: 30.0 - 100.0 ng/mL 38.9   2. Other depression, with emotional eating Ashley Acevedo reports stable mood and denies suicidal or homicidal ideations. She is on Lexapro 10 mg daily. She reports good control of cravings.   3. Insulin Resistance  Insulin level has steadily been >25 at last several checks. She would like to restart GLP-1  now that her insurance will cover Victoza again.  Assessment/Plan:   1. Vitamin D deficiency Low Vitamin D level contributes to fatigue and are associated with obesity, breast, and colon cancer. She agrees to continue to take prescription Vitamin D @50 ,000 IU every week and will follow-up for routine testing of Vitamin D, at least 2-3 times per year to avoid over-replacement. Refill Vit D for 1 month, as per below.  - Vitamin D, Ergocalciferol, (DRISDOL) 1.25 MG (50000 UNIT) CAPS capsule; Take 1 capsule (50,000 Units total) by mouth every 7 (seven) days.  Dispense: 4 capsule; Refill: 0  2. Other depression, with emotional eating Behavior modification techniques were discussed today to help Ashley Acevedo deal with her emotional/non-hunger eating behaviors.  Orders and follow up as documented in patient record. Refill Lexapro for 1 month, as per below.  - escitalopram (LEXAPRO) 10 MG tablet; Take 1 tablet (10 mg total) by mouth daily.  Dispense: 30 tablet; Refill: 1  3. Insulin Resistance  Ashley Acevedo will continue to work on weight loss, exercise, and decreasing simple carbohydrates to help decrease the risk of diabetes. Ashley Acevedo agreed to follow-up with as directed to closely monitor her progress. Restart Victoza per taper schedule..  4. Class 3 severe obesity with serious comorbidity and body mass index (BMI) of 45.0 to 49.9 in adult, unspecified obesity type [facility]) Ashley Acevedo is currently in the action stage of change. As such, her goal is to continue with weight loss efforts. She has agreed to the Category 2 Plan.   Ashley Acevedo has agreed to start Victoza 0.6 mg daily for 7 days, then increase to 1.2 mg  daily for 7 days and hold at this dose. Dispense 2.1 mg with no refills.  Exercise goals: As is  Behavioral modification strategies: increasing lean protein intake, meal planning and cooking strategies and planning for success.  Ashley Acevedo has agreed to follow-up with our clinic in 2 weeks. She was informed of the  importance of frequent follow-up visits to maximize her success with intensive lifestyle modifications for her multiple health conditions.  Objective:   VITALS: Per patient if applicable, see vitals. GENERAL: Alert and in no acute distress. CARDIOPULMONARY: No increased WOB. Speaking in clear sentences.  PSYCH: Pleasant and cooperative. Speech normal rate and rhythm. Affect is appropriate. Insight and judgement are appropriate. Attention is focused, linear, and appropriate.  NEURO: Oriented as arrived to appointment on time with no prompting.   Lab Results  Component Value Date   CREATININE 0.69 07/25/2020   BUN 14 07/25/2020   NA 143 07/25/2020   K 3.9 07/25/2020   CL 105 07/25/2020   CO2 24 07/25/2020   Lab Results  Component Value Date   ALT 13 07/25/2020   AST 13 07/25/2020   ALKPHOS 85 07/25/2020   BILITOT 0.2 07/25/2020   Lab Results  Component Value Date   HGBA1C 5.3 07/25/2020   HGBA1C 5.1 04/11/2020   HGBA1C 4.9 08/24/2019   Lab Results  Component Value Date   INSULIN 26.0 (H) 07/25/2020   INSULIN 26.0 (H) 04/11/2020   INSULIN 23.6 01/02/2020   INSULIN 27.6 (H) 08/24/2019   No results found for: TSH Lab Results  Component Value Date   CHOL 221 (H) 07/25/2020   HDL 70 07/25/2020   LDLCALC 124 (H) 07/25/2020   TRIG 157 (H) 07/25/2020   CHOLHDL 3.2 07/25/2020   Lab Results  Component Value Date   WBC 7.8 04/11/2020   HGB 12.3 04/11/2020   HCT 38.2 04/11/2020   MCV 88 04/11/2020   PLT 300 04/11/2020   Lab Results  Component Value Date   IRON 78 04/11/2020   TIBC 371 04/11/2020   FERRITIN 29 04/11/2020    Attestation Statements:   Reviewed by clinician on day of visit: allergies, medications, problem list, medical history, surgical history, family history, social history, and previous encounter notes.  Time spent on visit including pre-visit chart review and post-visit charting and care was 35 minutes.   Edmund Hilda, am acting as  Energy manager for William Hamburger, NP.  I have reviewed the above documentation for accuracy and completeness, and I agree with the above. - Katy d. Danford, NP-C

## 2020-11-01 ENCOUNTER — Encounter (INDEPENDENT_AMBULATORY_CARE_PROVIDER_SITE_OTHER): Payer: Self-pay

## 2020-11-06 ENCOUNTER — Encounter (INDEPENDENT_AMBULATORY_CARE_PROVIDER_SITE_OTHER): Payer: Self-pay

## 2020-11-13 ENCOUNTER — Encounter (INDEPENDENT_AMBULATORY_CARE_PROVIDER_SITE_OTHER): Payer: Self-pay | Admitting: Adult Health

## 2020-11-13 ENCOUNTER — Other Ambulatory Visit: Payer: Self-pay

## 2020-11-13 ENCOUNTER — Ambulatory Visit (INDEPENDENT_AMBULATORY_CARE_PROVIDER_SITE_OTHER): Payer: 59 | Admitting: Adult Health

## 2020-11-13 VITALS — BP 127/82 | HR 98 | Temp 98.3°F | Ht 68.0 in | Wt 324.0 lb

## 2020-11-13 DIAGNOSIS — E559 Vitamin D deficiency, unspecified: Secondary | ICD-10-CM | POA: Diagnosis not present

## 2020-11-13 DIAGNOSIS — F3289 Other specified depressive episodes: Secondary | ICD-10-CM

## 2020-11-13 DIAGNOSIS — E8881 Metabolic syndrome: Secondary | ICD-10-CM | POA: Diagnosis not present

## 2020-11-13 DIAGNOSIS — Z9189 Other specified personal risk factors, not elsewhere classified: Secondary | ICD-10-CM

## 2020-11-13 DIAGNOSIS — Z6841 Body Mass Index (BMI) 40.0 and over, adult: Secondary | ICD-10-CM

## 2020-11-13 MED ORDER — ESCITALOPRAM OXALATE 10 MG PO TABS: 10.0000 mg | ORAL_TABLET | Freq: Every day | ORAL | 1 refills | Status: DC

## 2020-11-13 MED ORDER — LIRAGLUTIDE 18 MG/3ML ~~LOC~~ SOPN: PEN_INJECTOR | SUBCUTANEOUS | 0 refills | Status: DC

## 2020-11-13 MED ORDER — VITAMIN D (ERGOCALCIFEROL) 1.25 MG (50000 UNIT) PO CAPS: 50000.0000 [IU] | ORAL_CAPSULE | ORAL | 0 refills | Status: DC

## 2020-11-14 NOTE — Progress Notes (Signed)
Chief Complaint:   OBESITY Ashley Acevedo is here to discuss her progress with her obesity treatment plan along with follow-up of her obesity related diagnoses. [name] is on the Category 2 Plan and states she is following her eating plan approximately 70% of the time. [name] states she is walking 30 minutes 3-4 times per week.  Today's visit was #: 21 Starting weight: 295 lbs Starting date: 08/24/2019 Today's weight: 324 lbs Today's date: 11/13/2020 Total lbs lost to date: 0 Total lbs lost since last in-office visit: 0  Interim History: Victoza was ordered on 10/22/2020. Pt says she never received the prescription; it was sent to the incorrect pharmacy. She is eager to restart GLP-1 to aid with decrease in insulin resistance and assist with weight loss.  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 Interval goal: Not maintain weight but to lose some weight.  Subjective:   1. Insulin resistance Pt's insulin level has been above goal at last 4 checks. Victoza 0.6 was ordered 10/22/2020, but pt never received the prescription.  Lab Results  Component Value Date   INSULIN 26.0 (H) 07/25/2020   INSULIN 26.0 (H) 04/11/2020   INSULIN 23.6 01/02/2020   INSULIN 27.6 (H) 08/24/2019   Lab Results  Component Value Date   HGBA1C 5.3 07/25/2020    2. Vitamin D deficiency Ashley Acevedo's Vitamin D level was 38.9, which is below goal of 50 on 07/25/2020. She is currently taking prescription vitamin D 50,000 IU each week. She denies nausea, vomiting or muscle weakness.   Ref. Range 07/25/2020 08:37  Vitamin D, 25-Hydroxy Latest Ref Range: 30.0 - 100.0 ng/mL 38.9   3. Other depression, with emotional eating Pt reports excellent mood on Lexapro 10 mg daily. She denies symptoms of serotonin syndrome. She will take 1-2 800 mg ibuprofen during her menstrual cramps.  4. At risk for diabetes mellitus Ashley Acevedo is at higher than  average risk for developing diabetes due to obesity and insulin resistance.    Assessment/Plan:   1. Insulin resistance Erminia will continue to work on weight loss, exercise, and decreasing simple carbohydrates to help decrease the risk of diabetes. Tyger agreed to follow-up with [place] as directed to closely monitor her progress.  Refill Victoza 0.6 mg- new rx sent in to correct pharmacy.  2. Vitamin D deficiency Low Vitamin D level contributes to fatigue and are associated with obesity, breast, and colon cancer. She agrees to continue to take prescription Vitamin D @50 ,000 IU every week and will follow-up for routine testing of Vitamin D, at least 2-3 times per year to avoid over-replacement.  - Vitamin D, Ergocalciferol, (DRISDOL) 1.25 MG (50000 UNIT) CAPS capsule; Take 1 capsule (50,000 Units total) by mouth every 7 (seven) days.  Dispense: 4 capsule; Refill: 0  3. Other depression, with emotional eating Behavior modification techniques were discussed today to help [name] deal with her emotional/non-hunger eating behaviors.  Orders and follow up as documented in patient record.   - escitalopram (LEXAPRO) 10 MG tablet; Take 1 tablet (10 mg total) by mouth daily.  Dispense: 30 tablet; Refill: 1  4. At risk for diabetes mellitus Ashley Acevedo was given approximately 15 minutes of diabetes education and counseling today. We discussed intensive lifestyle modifications today with an emphasis on weight loss as well as increasing exercise and decreasing simple carbohydrates in her diet. We also reviewed medication options with an emphasis on risk versus benefit of  those discussed.   Repetitive spaced learning was employed today to elicit superior memory formation and behavioral change.  5. Class 3 severe obesity with serious comorbidity and body mass index (BMI) of 45.0 to 49.9 in adult, unspecified obesity type [facility]) Ashley Acevedo is currently in the action stage of change. As such, her goal is to continue with  weight loss efforts. She has agreed to the Category 2 Plan.   Exercise goals: As is  Behavioral modification strategies: increasing lean protein intake, meal planning and cooking strategies, travel eating strategies, holiday eating strategies  and planning for success.  Ashley Acevedo has agreed to follow-up with our clinic in 3 weeks. She was informed of the importance of frequent follow-up visits to maximize her success with intensive lifestyle modifications for her multiple health conditions.   Objective:   Blood pressure 127/82, pulse 98, temperature 98.3 F (36.8 C), height 5\' 8"  (1.727 m), weight (!) 324 lb (147 kg), SpO2 98 %. Body mass index is 49.26 kg/m.  General: Cooperative, alert, well developed, in no acute distress. HEENT: Conjunctivae and lids unremarkable. Cardiovascular: Regular rhythm.  Lungs: Normal work of breathing. Neurologic: No focal deficits.   Lab Results  Component Value Date   CREATININE 0.69 07/25/2020   BUN 14 07/25/2020   NA 143 07/25/2020   K 3.9 07/25/2020   CL 105 07/25/2020   CO2 24 07/25/2020   Lab Results  Component Value Date   ALT 13 07/25/2020   AST 13 07/25/2020   ALKPHOS 85 07/25/2020   BILITOT 0.2 07/25/2020   Lab Results  Component Value Date   HGBA1C 5.3 07/25/2020   HGBA1C 5.1 04/11/2020   HGBA1C 4.9 08/24/2019   Lab Results  Component Value Date   INSULIN 26.0 (H) 07/25/2020   INSULIN 26.0 (H) 04/11/2020   INSULIN 23.6 01/02/2020   INSULIN 27.6 (H) 08/24/2019   No results found for: TSH Lab Results  Component Value Date   CHOL 221 (H) 07/25/2020   HDL 70 07/25/2020   LDLCALC 124 (H) 07/25/2020   TRIG 157 (H) 07/25/2020   CHOLHDL 3.2 07/25/2020   Lab Results  Component Value Date   WBC 7.8 04/11/2020   HGB 12.3 04/11/2020   HCT 38.2 04/11/2020   MCV 88 04/11/2020   PLT 300 04/11/2020   Lab Results  Component Value Date   IRON 78 04/11/2020   TIBC 371 04/11/2020   FERRITIN 29 04/11/2020    Attestation  Statements:   Reviewed by clinician on day of visit: allergies, medications, problem list, medical history, surgical history, family history, social history, and previous encounter notes.  04/13/2020, am acting as Edmund Hilda for Energy manager, NP.  I have reviewed the above documentation for accuracy and completeness, and I agree with the above. -  Francesca Strome d. Felecia Stanfill, NP-C

## 2020-12-04 ENCOUNTER — Encounter: Payer: Self-pay | Admitting: Physician Assistant

## 2020-12-04 ENCOUNTER — Other Ambulatory Visit (INDEPENDENT_AMBULATORY_CARE_PROVIDER_SITE_OTHER): Payer: Self-pay | Admitting: Adult Health

## 2020-12-04 ENCOUNTER — Ambulatory Visit (INDEPENDENT_AMBULATORY_CARE_PROVIDER_SITE_OTHER): Payer: 59 | Admitting: Adult Health

## 2020-12-04 DIAGNOSIS — E8881 Metabolic syndrome: Secondary | ICD-10-CM

## 2020-12-04 MED ORDER — PANTOPRAZOLE SODIUM 40 MG PO TBEC: 40.0000 mg | DELAYED_RELEASE_TABLET | Freq: Every day | ORAL | 1 refills | Status: DC

## 2020-12-20 ENCOUNTER — Other Ambulatory Visit: Payer: Self-pay

## 2020-12-20 ENCOUNTER — Ambulatory Visit (INDEPENDENT_AMBULATORY_CARE_PROVIDER_SITE_OTHER): Payer: 59 | Admitting: Adult Health

## 2020-12-20 ENCOUNTER — Encounter (INDEPENDENT_AMBULATORY_CARE_PROVIDER_SITE_OTHER): Payer: Self-pay | Admitting: Adult Health

## 2020-12-20 VITALS — BP 122/75 | HR 95 | Temp 97.9°F | Ht 68.0 in | Wt 331.0 lb

## 2020-12-20 DIAGNOSIS — E8881 Metabolic syndrome: Secondary | ICD-10-CM

## 2020-12-20 DIAGNOSIS — E559 Vitamin D deficiency, unspecified: Secondary | ICD-10-CM

## 2020-12-20 DIAGNOSIS — R5383 Other fatigue: Secondary | ICD-10-CM | POA: Diagnosis not present

## 2020-12-20 DIAGNOSIS — E782 Mixed hyperlipidemia: Secondary | ICD-10-CM | POA: Diagnosis not present

## 2020-12-20 DIAGNOSIS — Z9189 Other specified personal risk factors, not elsewhere classified: Secondary | ICD-10-CM

## 2020-12-20 DIAGNOSIS — Z6841 Body Mass Index (BMI) 40.0 and over, adult: Secondary | ICD-10-CM

## 2020-12-20 HISTORY — DX: Other fatigue: R53.83

## 2020-12-21 LAB — COMPREHENSIVE METABOLIC PANEL
ALT: 13 IU/L (ref 0–32)
AST: 12 IU/L (ref 0–40)
Albumin/Globulin Ratio: 1.5 (ref 1.2–2.2)
Albumin: 4.1 g/dL (ref 3.9–5.0)
Alkaline Phosphatase: 94 IU/L (ref 44–121)
BUN/Creatinine Ratio: 19 (ref 9–23)
BUN: 13 mg/dL (ref 6–20)
Bilirubin Total: 0.3 mg/dL (ref 0.0–1.2)
CO2: 20 mmol/L (ref 20–29)
Calcium: 9.6 mg/dL (ref 8.7–10.2)
Chloride: 101 mmol/L (ref 96–106)
Creatinine, Ser: 0.69 mg/dL (ref 0.57–1.00)
Globulin, Total: 2.8 g/dL (ref 1.5–4.5)
Glucose: 76 mg/dL (ref 65–99)
Potassium: 4.5 mmol/L (ref 3.5–5.2)
Sodium: 138 mmol/L (ref 134–144)
Total Protein: 6.9 g/dL (ref 6.0–8.5)
eGFR: 123 mL/min/{1.73_m2} (ref >59)

## 2020-12-21 LAB — CBC
Hemoglobin: 12.8 g/dL (ref 11.1–15.9)
MCH: 27.6 pg (ref 26.6–33.0)
MCHC: 32.5 g/dL (ref 31.5–35.7)
MCV: 85 fL (ref 79–97)
Platelets: 350 10*3/uL (ref 150–450)
RBC: 4.64 x10E6/uL (ref 3.77–5.28)
RDW: 13.1 % (ref 11.7–15.4)
WBC: 8.1 10*3/uL (ref 3.4–10.8)

## 2020-12-21 LAB — LIPID PANEL
Chol/HDL Ratio: 2.8 ratio (ref 0.0–4.4)
Cholesterol, Total: 237 mg/dL — ABNORMAL HIGH (ref 100–199)
HDL: 86 mg/dL (ref >39)
LDL Chol Calc (NIH): 125 mg/dL — ABNORMAL HIGH (ref 0–99)
Triglycerides: 154 mg/dL — ABNORMAL HIGH (ref 0–149)
VLDL Cholesterol Cal: 26 mg/dL (ref 5–40)

## 2020-12-21 LAB — ANEMIA PANEL
Ferritin: 23 ng/mL (ref 15–150)
Folate, Hemolysate: 486 ng/mL
Folate, RBC: 1234 ng/mL (ref >498)
Hematocrit: 39.4 % (ref 34.0–46.6)
Iron Saturation: 18 % (ref 15–55)
Iron: 76 ug/dL (ref 27–159)
Retic Ct Pct: 2.2 % (ref 0.6–2.6)
Total Iron Binding Capacity: 416 ug/dL (ref 250–450)
UIBC: 340 ug/dL (ref 131–425)
Vitamin B-12: 396 pg/mL (ref 232–1245)

## 2020-12-21 LAB — HEMOGLOBIN A1C
Est. average glucose Bld gHb Est-mCnc: 103 mg/dL
Hgb A1c MFr Bld: 5.2 % (ref 4.8–5.6)

## 2020-12-21 LAB — INSULIN, RANDOM: INSULIN: 19.7 u[IU]/mL (ref 2.6–24.9)

## 2020-12-21 LAB — VITAMIN D 25 HYDROXY (VIT D DEFICIENCY, FRACTURES): Vit D, 25-Hydroxy: 26.6 ng/mL — ABNORMAL LOW (ref 30.0–100.0)

## 2020-12-24 NOTE — Progress Notes (Signed)
Chief Complaint:   OBESITY Ashley Acevedo is here to discuss her progress with her obesity treatment plan along with follow-up of her obesity related diagnoses. Ashley Acevedo is on the Category 2 Plan and states she is following her eating plan approximately 70% of the time. Ashley Acevedo states she is walking 45 minutes 2 times per week.  Today's visit was #: 22 Starting weight: 295 lbs Starting date: 08/24/2019 Today's weight: 331 lbs Today's date: 12/20/2020 Total lbs lost to date: 0 Total lbs lost since last in-office visit: 0  Interim History: Ashley Acevedo has titrated up to Victoza 1.2 mg QD. She typically takes it M-F and misses doses Saturday and Sunday. Bioimpedance reflects increase 5 lbs of water weight since last OV. She is up a total of 7 lbs since her last OV. She is tolerating GLP-1 well.    Subjective:   1. Vitamin D deficiency Ashley Acevedo's Vitamin D level was well below goal of 50, at 38.9 on 07/25/2020. She is currently taking prescription vitamin D 50,000 IU each week. She denies nausea, vomiting or muscle weakness.   Ref. Range 07/25/2020 08:37  Vitamin D, 25-Hydroxy Latest Ref Range: 30.0 - 100.0 ng/mL 38.9   2. Insulin resistance Ashley Acevedo is on Victoza 1.2mg  QD- is tolerating it well.  She has been challenged to remember doses over weekend.  3. Mixed hyperlipidemia Ashley Acevedo's 07/25/2020 lipid panel resulted total, triglycerides, and LDL all above goal. HDL is excellent at 70. She is not on statin therapy.   4. Fatigue, unspecified type Ashley Acevedo has a history of iron deficiency anemia. She is on oral OTC iron 45 mg QD. She has experienced increased daily fatigue for the last month.  5. At risk for heart disease Ashley Acevedo is at a higher than average risk for cardiovascular disease due to obesity, insulin resistance, and hyperlipidemia.  Assessment/Plan:   1. Vitamin D deficiency Low Vitamin D level contributes to fatigue and are associated with obesity, breast, and colon cancer. She agrees to continue  to take prescription Vitamin D @50 ,000 IU every week and will follow-up for routine testing of Vitamin D, at least 2-3 times per year to avoid over-replacement. Check labs today.  - VITAMIN D 25 Hydroxy (Vit-D Deficiency, Fractures)  2. Insulin resistance Ashley Acevedo will continue to work on weight loss, exercise, and decreasing simple carbohydrates to help decrease the risk of diabetes. Ashley Acevedo agreed to follow-up with as directed to closely monitor her progress. Continue Victoza 1.2mg  QD. Check labs today.  - Hemoglobin A1c - Insulin, random  3. Mixed hyperlipidemia Cardiovascular risk and specific lipid/LDL goals reviewed.  We discussed several lifestyle modifications today and Ashley Acevedo will continue to work on diet, exercise and weight loss efforts. Orders and follow up as documented in patient record. Check labs today.  Counseling Intensive lifestyle modifications are the first line treatment for this issue.  Dietary changes: Increase soluble fiber. Decrease simple carbohydrates.  Exercise changes: Moderate to vigorous-intensity aerobic activity 150 minutes per week if tolerated.  Lipid-lowering medications: see documented in medical record.  - Comprehensive metabolic panel - Lipid panel  4. Fatigue, unspecified type Ashley Acevedo does feel that her weight is causing her energy to be lower than it should be. Fatigue may be related to obesity, depression or many other causes. Labs will be ordered, and in the meanwhile, Ashley Acevedo will focus on self care including making healthy food choices, increasing physical activity and focusing on stress reduction. Check labs today.  - CBC - Vitamin B12 - Folate -  Ferritin - Iron and TIBC - Anemia panel  5. At risk for heart disease Ashley Acevedo was given approximately 15 minutes of coronary artery disease prevention counseling today. She is 25 y.o. female and has risk factors for heart disease including obesity. We discussed intensive lifestyle modifications today  with an emphasis on specific weight loss instructions and strategies.   Repetitive spaced learning was employed today to elicit superior memory formation and behavioral change.  6. Class 3 severe obesity with serious comorbidity and body mass index (BMI) of 45.0 to 49.9 in adult, unspecified obesity type [name]) Ashley Acevedo is currently in the action stage of change. As such, her goal is to continue with weight loss efforts. She has agreed to the Category 2 Plan.   Check RMR at next OV. Arrive 30 minutes prior to OV. Find time of day that works well for Victoza dose for entire walk- set timer as a reminder.  Exercise goals: As is  Behavioral modification strategies: increasing lean protein intake, decreasing simple carbohydrates, meal planning and cooking strategies and planning for success.  Ashley Acevedo has agreed to follow-up with our clinic in 3 weeks. She was informed of the importance of frequent follow-up visits to maximize her success with intensive lifestyle modifications for her multiple health conditions.   Ashley Acevedo was informed we would discuss her lab results at her next visit unless there is a critical issue that needs to be addressed sooner. Ashley Acevedo agreed to keep her next visit at the agreed upon time to discuss these results.  Objective:   Blood pressure 122/75, pulse 95, temperature 97.9 F (36.6 C), temperature source Oral, height 5\' 8"  (1.727 m), weight (!) 331 lb (150.1 kg), SpO2 98 %. Body mass index is 50.33 kg/m.  General: Cooperative, alert, well developed, in no acute distress. HEENT: Conjunctivae and lids unremarkable. Cardiovascular: Regular rhythm.  Lungs: Normal work of breathing. Neurologic: No focal deficits.   Lab Results  Component Value Date   CREATININE 0.69 12/20/2020   BUN 13 12/20/2020   NA 138 12/20/2020   K 4.5 12/20/2020   CL 101 12/20/2020   CO2 20 12/20/2020   Lab Results  Component Value Date   ALT 13 12/20/2020   AST 12 12/20/2020   ALKPHOS 94  12/20/2020   BILITOT 0.3 12/20/2020   Lab Results  Component Value Date   HGBA1C 5.2 12/20/2020   HGBA1C 5.3 07/25/2020   HGBA1C 5.1 04/11/2020   HGBA1C 4.9 08/24/2019   Lab Results  Component Value Date   INSULIN 19.7 12/20/2020   INSULIN 26.0 (H) 07/25/2020   INSULIN 26.0 (H) 04/11/2020   INSULIN 23.6 01/02/2020   INSULIN 27.6 (H) 08/24/2019   No results found for: TSH Lab Results  Component Value Date   CHOL 237 (H) 12/20/2020   HDL 86 12/20/2020   LDLCALC 125 (H) 12/20/2020   TRIG 154 (H) 12/20/2020   CHOLHDL 2.8 12/20/2020   Lab Results  Component Value Date   WBC 8.1 12/20/2020   HGB 12.8 12/20/2020   HCT 39.4 12/20/2020   MCV 85 12/20/2020   PLT 350 12/20/2020   Lab Results  Component Value Date   IRON 76 12/20/2020   TIBC 416 12/20/2020   FERRITIN 23 12/20/2020     Attestation Statements:   Reviewed by clinician on day of visit: allergies, medications, problem list, medical history, surgical history, family history, social history, and previous encounter notes.  02/19/2021, am acting as Edmund Hilda for Energy manager, NP.  I have  reviewed the above documentation for accuracy and completeness, and I agree with the above. -  Alize Borrayo d. Reily Treloar, NP-C

## 2020-12-26 ENCOUNTER — Encounter (INDEPENDENT_AMBULATORY_CARE_PROVIDER_SITE_OTHER): Payer: Self-pay | Admitting: Adult Health

## 2020-12-26 ENCOUNTER — Other Ambulatory Visit (INDEPENDENT_AMBULATORY_CARE_PROVIDER_SITE_OTHER): Payer: Self-pay | Admitting: Adult Health

## 2020-12-26 DIAGNOSIS — F3289 Other specified depressive episodes: Secondary | ICD-10-CM

## 2020-12-26 NOTE — Telephone Encounter (Signed)
Please advise 

## 2021-01-07 ENCOUNTER — Encounter (INDEPENDENT_AMBULATORY_CARE_PROVIDER_SITE_OTHER): Payer: Self-pay | Admitting: Adult Health

## 2021-01-08 NOTE — Telephone Encounter (Signed)
Refill request

## 2021-01-10 ENCOUNTER — Ambulatory Visit (INDEPENDENT_AMBULATORY_CARE_PROVIDER_SITE_OTHER): Payer: 59 | Admitting: Adult Health

## 2021-01-12 ENCOUNTER — Other Ambulatory Visit (INDEPENDENT_AMBULATORY_CARE_PROVIDER_SITE_OTHER): Payer: Self-pay | Admitting: Adult Health

## 2021-01-12 DIAGNOSIS — E8881 Metabolic syndrome: Secondary | ICD-10-CM

## 2021-02-13 ENCOUNTER — Encounter (INDEPENDENT_AMBULATORY_CARE_PROVIDER_SITE_OTHER): Payer: Self-pay | Admitting: Adult Health

## 2021-02-13 ENCOUNTER — Other Ambulatory Visit: Payer: Self-pay

## 2021-02-13 ENCOUNTER — Ambulatory Visit (INDEPENDENT_AMBULATORY_CARE_PROVIDER_SITE_OTHER): Payer: 59 | Admitting: Adult Health

## 2021-02-13 VITALS — BP 129/78 | HR 86 | Temp 98.5°F | Ht 68.0 in | Wt 346.0 lb

## 2021-02-13 DIAGNOSIS — E8881 Metabolic syndrome: Secondary | ICD-10-CM | POA: Diagnosis not present

## 2021-02-13 DIAGNOSIS — E538 Deficiency of other specified B group vitamins: Secondary | ICD-10-CM

## 2021-02-13 DIAGNOSIS — E559 Vitamin D deficiency, unspecified: Secondary | ICD-10-CM | POA: Diagnosis not present

## 2021-02-13 DIAGNOSIS — Z6841 Body Mass Index (BMI) 40.0 and over, adult: Secondary | ICD-10-CM

## 2021-02-13 DIAGNOSIS — Z9189 Other specified personal risk factors, not elsewhere classified: Secondary | ICD-10-CM

## 2021-02-13 DIAGNOSIS — E782 Mixed hyperlipidemia: Secondary | ICD-10-CM

## 2021-02-13 MED ORDER — VITAMIN D (ERGOCALCIFEROL) 1.25 MG (50000 UNIT) PO CAPS: 50000.0000 [IU] | ORAL_CAPSULE | ORAL | 0 refills | Status: DC

## 2021-02-13 MED ORDER — VICTOZA 18 MG/3ML ~~LOC~~ SOPN: 1.2000 mg | PEN_INJECTOR | Freq: Every day | SUBCUTANEOUS | 0 refills | Status: DC

## 2021-02-14 NOTE — Progress Notes (Signed)
Chief Complaint:   OBESITY Ashley Acevedo is here to discuss her progress with her obesity treatment plan along with follow-up of her obesity related diagnoses. Ashley Acevedo is on the Category 2 Plan and states she is following her eating plan approximately 60% of the time. Ashley Acevedo states she is walking 30 minutes 7 times per week.  Today's visit was #: 23 Starting weight: 295 lbs Starting date: 08/24/2019 Today's weight: 346 lbs Today's date: 02/12/2021 Total lbs lost to date: 0 Total lbs lost since last in-office visit: 0  Interim History: Ashley Acevedo experienced dizziness several weeks ago and was concerned that it was attributed to injectable GLP-1. She was on Victoza 1.2mg  QD at the the time of the acute dizziness.  She briefly stopped this Rx, however still experienced dizziness sx's.  In hindsight, she believes that she was dehydrated.  She has since increased fluids intake- denies any current dizziness.  She has restarted Victoza at 0.6mg  QD- tolerating well.  Subjective:   1. Insulin resistance Discussed labs with patient today. 12/20/2020 BG 76 and A1c 5.2, both WNL. Pt's insulin level still elevated, however, decreased from last check at 19.7. She was briefly off Victoza due to concerns that the GLP-1 was causing dizziness. She has restarted Victoza at 0.6mg  QD. She denies mass in neck, dysphagia, dyspepsia, persistent hoarseness, or GI upset.  She reports polyphagia on lower GLP-1 dose.  Lab Results  Component Value Date   INSULIN 19.7 12/20/2020   INSULIN 26.0 (H) 07/25/2020   INSULIN 26.0 (H) 04/11/2020   INSULIN 23.6 01/02/2020   INSULIN 27.6 (H) 08/24/2019   Lab Results  Component Value Date   HGBA1C 5.2 12/20/2020   2. Vitamin B12 deficiency Discussed labs with patient today. Pt reports stable energy. 12/20/2020 B12 level WNL at 396.  Lab Results  Component Value Date   VITAMINB12 396 12/20/2020    3. Vitamin D deficiency Discussed labs with patient today. Ashley Acevedo's Vitamin D  level was 26.6 on 12/20/2020, which is below goal of 50. She is currently taking prescription vitamin D 50,000 IU each week. She denies nausea, vomiting or muscle weakness.  4. Mixed hyperlipidemia Discussed labs with patient today. 12/20/2020 lipid panel revealed elevated total, triglycerides, and LDL, with exceptional HDL at 86. Pt is not on statin therapy.  Lab Results  Component Value Date   ALT 13 12/20/2020   AST 12 12/20/2020   ALKPHOS 94 12/20/2020   BILITOT 0.3 12/20/2020   Lab Results  Component Value Date   CHOL 237 (H) 12/20/2020   HDL 86 12/20/2020   LDLCALC 125 (H) 12/20/2020   TRIG 154 (H) 12/20/2020   CHOLHDL 2.8 12/20/2020    5. At risk for constipation Ashley Acevedo is at increased risk for constipation due to increase of GLP-1 for insulin resistance and obesity.  Assessment/Plan:   1. Insulin resistance Ashley Acevedo will continue to work on weight loss, exercise, and decreasing simple carbohydrates to help decrease the risk of diabetes. Ashley Acevedo agreed to follow-up with [place] as directed to closely monitor her progress. Increase Victoza from 0.6mg  to 1.2mg  QD.  - liraglutide (VICTOZA) 18 MG/3ML SOPN; Inject 1.2 mg into the skin daily.  Dispense: 3 mL; Refill: 0  2. Vitamin B12 deficiency The diagnosis was reviewed with the patient. Counseling provided today, see below. We will continue to monitor. Orders and follow up as documented in patient record. Continue OTC B12 supplement.  Counseling . The body needs vitamin B12: to make red blood cells; to make  DNA; and to help the nerves work properly so they can carry messages from the brain to the body.  . The main causes of vitamin B12 deficiency include dietary deficiency, digestive diseases, pernicious anemia, and having a surgery in which part of the stomach or small intestine is removed.  . Certain medicines can make it harder for the body to absorb vitamin B12. These medicines include: heartburn medications; some antibiotics; some  medications used to treat diabetes, gout, and high cholesterol.  . In some cases, there are no symptoms of this condition. If the condition leads to anemia or nerve damage, various symptoms can occur, such as weakness or fatigue, shortness of breath, and numbness or tingling in your hands and feet.   . Treatment:  o May include taking vitamin B12 supplements.  o Avoid alcohol.  o Eat lots of healthy foods that contain vitamin B12: - Beef, pork, chicken, [place], and organ meats, such as liver.  - Seafood: This includes clams, rainbow trout, salmon, tuna, and haddock. Eggs.  - Cereal and dairy products that are fortified: This means that vitamin B12 has been added to the food.   3. Vitamin D deficiency Low Vitamin D level contributes to fatigue and are associated with obesity, breast, and colon cancer. She agrees to continue to take prescription Vitamin D @50 ,000 IU every week and will follow-up for routine testing of Vitamin D, at least 2-3 times per year to avoid over-replacement.  - Vitamin D, Ergocalciferol, (DRISDOL) 1.25 MG (50000 UNIT) CAPS capsule; Take 1 capsule (50,000 Units total) by mouth every 7 (seven) days.  Dispense: 4 capsule; Refill: 0  4. Mixed hyperlipidemia Cardiovascular risk and specific lipid/LDL goals reviewed.  We discussed several lifestyle modifications today and Ashley Acevedo will continue to work on diet, exercise and weight loss efforts. Orders and follow up as documented in patient record. Decrease saturated fats and continue regular exercise.  Counseling Intensive lifestyle modifications are the first line treatment for this issue. . Dietary changes: Increase soluble fiber. Decrease simple carbohydrates. . Exercise changes: Moderate to vigorous-intensity aerobic activity 150 minutes per week if tolerated. . Lipid-lowering medications: see documented in medical record.  5. At risk for constipation Ashley Acevedo was given approximately 15 minutes of counseling today regarding  prevention of constipation. She was encouraged to increase water and fiber intake.   6. Class 3 severe obesity with serious comorbidity and body mass index (BMI) of 50.0 to 59.9 in adult, unspecified obesity type Ashley Acevedo)  Ashley Acevedo is currently in the action stage of change. As such, her goal is to continue with weight loss efforts. She has agreed to the Category 2 Plan.   Exercise goals: As is  Behavioral modification strategies: increasing lean protein intake, meal planning and cooking strategies and planning for success.  Ashley Acevedo has agreed to follow-up with our clinic in 2 weeks. She was informed of the importance of frequent follow-up visits to maximize her success with intensive lifestyle modifications for her multiple health conditions.   Objective:   Blood pressure 129/78, pulse 86, temperature 98.5 F (36.9 C), height 5\' 8"  (1.727 m), weight (!) 346 lb (156.9 kg), SpO2 99 %. Body mass index is 52.61 kg/m.  General: Cooperative, alert, well developed, in no acute distress. HEENT: Conjunctivae and lids unremarkable. Cardiovascular: Regular rhythm.  Lungs: Normal work of breathing. Neurologic: No focal deficits.   Lab Results  Component Value Date   CREATININE 0.69 12/20/2020   BUN 13 12/20/2020   NA 138 12/20/2020   K  4.5 12/20/2020   CL 101 12/20/2020   CO2 20 12/20/2020   Lab Results  Component Value Date   ALT 13 12/20/2020   AST 12 12/20/2020   ALKPHOS 94 12/20/2020   BILITOT 0.3 12/20/2020   Lab Results  Component Value Date   HGBA1C 5.2 12/20/2020   HGBA1C 5.3 07/25/2020   HGBA1C 5.1 04/11/2020   HGBA1C 4.9 08/24/2019   Lab Results  Component Value Date   INSULIN 19.7 12/20/2020   INSULIN 26.0 (H) 07/25/2020   INSULIN 26.0 (H) 04/11/2020   INSULIN 23.6 01/02/2020   INSULIN 27.6 (H) 08/24/2019   No results found for: TSH Lab Results  Component Value Date   CHOL 237 (H) 12/20/2020   HDL 86 12/20/2020   LDLCALC 125 (H) 12/20/2020   TRIG 154 (H)  12/20/2020   CHOLHDL 2.8 12/20/2020   Lab Results  Component Value Date   WBC 8.1 12/20/2020   HGB 12.8 12/20/2020   HCT 39.4 12/20/2020   MCV 85 12/20/2020   PLT 350 12/20/2020   Lab Results  Component Value Date   IRON 76 12/20/2020   TIBC 416 12/20/2020   FERRITIN 23 12/20/2020    Attestation Statements:   Reviewed by clinician on day of visit: allergies, medications, problem list, medical history, surgical history, family history, social history, and previous encounter notes.  Edmund Hilda, CMA, am acting as transcriptionist for William Hamburger, NP.  I have reviewed the above documentation for accuracy and completeness, and I agree with the above. -  Ashley Acevedo d. Ashley Scheiber, NP-C

## 2021-02-27 ENCOUNTER — Ambulatory Visit (INDEPENDENT_AMBULATORY_CARE_PROVIDER_SITE_OTHER): Payer: 59 | Admitting: Adult Health

## 2021-02-27 ENCOUNTER — Encounter (INDEPENDENT_AMBULATORY_CARE_PROVIDER_SITE_OTHER): Payer: Self-pay | Admitting: Adult Health

## 2021-02-27 ENCOUNTER — Other Ambulatory Visit: Payer: Self-pay

## 2021-02-27 VITALS — BP 116/67 | HR 86 | Temp 98.4°F | Ht 68.0 in | Wt 345.0 lb

## 2021-02-27 DIAGNOSIS — E8881 Metabolic syndrome: Secondary | ICD-10-CM | POA: Diagnosis not present

## 2021-02-27 DIAGNOSIS — Z6841 Body Mass Index (BMI) 40.0 and over, adult: Secondary | ICD-10-CM | POA: Diagnosis not present

## 2021-02-27 DIAGNOSIS — E88819 Insulin resistance, unspecified: Secondary | ICD-10-CM

## 2021-02-27 DIAGNOSIS — E559 Vitamin D deficiency, unspecified: Secondary | ICD-10-CM

## 2021-02-28 NOTE — Progress Notes (Signed)
Chief Complaint:   OBESITY Ashley Acevedo is here to discuss her progress with her obesity treatment plan along with follow-up of her obesity related diagnoses. Ashley Acevedo is on the Category 2 Plan and states she is following her eating plan approximately 80% of the time. Ashley Acevedo states she is walking 30 minutes 5 times per week.  Today's visit was #: 24 Starting weight: 295 lbs Starting date: 08/24/2019 Today's weight: 345 lbs Today's date: 02/27/2021 Total lbs lost to date: 0 Total lbs lost since last in-office visit: 1  Interim History: Ashley Acevedo will travel to [place] over [employer] Day weekend. She has been on Victoza since 02/2020- then off summer/fall 2021 due to insurance denial. Pt restarted Victoza January 2022. She has titrated up to 1.2 mg- been at this dose for 2 weeks.  Subjective:   1. Insulin resistance 12/20/2020 insulin level 19.7- trending down from low-mid 20's.  12/20/2020 BG 76, A1c 5.2- WNL.  Ashley Acevedo was started Victoza 02/2020- then off summer/fall 2021 due to insurance denial.  Pt restarted Victoza January 2022. She has titrated up to 1.2 mg- been at this dose for 2 weeks.  Lab Results  Component Value Date   INSULIN 19.7 12/20/2020   INSULIN 26.0 (H) 07/25/2020   INSULIN 26.0 (H) 04/11/2020   INSULIN 23.6 01/02/2020   INSULIN 27.6 (H) 08/24/2019   Lab Results  Component Value Date   HGBA1C 5.2 12/20/2020    2. Vitamin D deficiency Ashley Acevedo's Vitamin D level was 26.6 on 12/20/2020, which is below goal of 50. She is currently taking prescription vitamin D 50,000 IU each week. She denies nausea, vomiting or muscle weakness.  Assessment/Plan:   1. Insulin resistance Ashley Acevedo will continue to work on weight loss, exercise, and decreasing simple carbohydrates to help decrease the risk of diabetes. Ashley Acevedo agreed to follow-up with [place] as directed to closely monitor her progress. -Continue Victoza 1.2 mg QD. Pt denies need for refill today.  2. Vitamin D deficiency Low Vitamin D  level contributes to fatigue and are associated with obesity, breast, and colon cancer. She agrees to continue to take prescription Vitamin D @50 ,000 IU every week and will follow-up for routine testing of Vitamin D, at least 2-3 times per year to avoid over-replacement. Pt denies need for refill today.  3. Class 3 severe obesity with serious comorbidity and body mass index (BMI) of 50.0 to 59.9 in adult, unspecified obesity type Ashley Acevedo is currently in the action stage of change. As such, her goal is to continue with weight loss efforts. She has agreed to the Category 2 Plan.   Check fasting labs at next OV.  Exercise goals: As is  Behavioral modification strategies: increasing lean protein intake, decreasing simple carbohydrates, meal planning and cooking strategies, travel eating strategies, celebration eating strategies and planning for success.  Ashley Acevedo has agreed to follow-up with our clinic in 2 weeks- fasting. She was informed of the importance of frequent follow-up visits to maximize her success with intensive lifestyle modifications for her multiple health conditions.   Objective:   Blood pressure 116/67, pulse 86, temperature 98.4 F (36.9 C), height 5\' 8"  (1.727 m), weight (!) 345 lb (156.5 kg), SpO2 97 %. Body mass index is 52.46 kg/m.  General: Cooperative, alert, well developed, in no acute distress. HEENT: Conjunctivae and lids unremarkable. Cardiovascular: Regular rhythm.  Lungs: Normal work of breathing. Neurologic: No focal deficits.   Lab Results  Component Value Date   CREATININE 0.69 12/20/2020   BUN  13 12/20/2020   NA 138 12/20/2020   K 4.5 12/20/2020   CL 101 12/20/2020   CO2 20 12/20/2020   Lab Results  Component Value Date   ALT 13 12/20/2020   AST 12 12/20/2020   ALKPHOS 94 12/20/2020   BILITOT 0.3 12/20/2020   Lab Results  Component Value Date   HGBA1C 5.2 12/20/2020   HGBA1C 5.3 07/25/2020   HGBA1C 5.1 04/11/2020   HGBA1C 4.9 08/24/2019    Lab Results  Component Value Date   INSULIN 19.7 12/20/2020   INSULIN 26.0 (H) 07/25/2020   INSULIN 26.0 (H) 04/11/2020   INSULIN 23.6 01/02/2020   INSULIN 27.6 (H) 08/24/2019   No results found for: TSH Lab Results  Component Value Date   CHOL 237 (H) 12/20/2020   HDL 86 12/20/2020   LDLCALC 125 (H) 12/20/2020   TRIG 154 (H) 12/20/2020   CHOLHDL 2.8 12/20/2020   Lab Results  Component Value Date   WBC 8.1 12/20/2020   HGB 12.8 12/20/2020   HCT 39.4 12/20/2020   MCV 85 12/20/2020   PLT 350 12/20/2020   Lab Results  Component Value Date   IRON 76 12/20/2020   TIBC 416 12/20/2020   FERRITIN 23 12/20/2020     Attestation Statements:   Reviewed by clinician on day of visit: allergies, medications, problem list, medical history, surgical history, family history, social history, and previous encounter notes.  Time spent on visit including pre-visit chart review and post-visit care and charting was 32 minutes.   Edmund Hilda, CMA, am acting as transcriptionist for William Hamburger, NP.  I have reviewed the above documentation for accuracy and completeness, and I agree with the above. -  Arryana Tolleson d. Veneta Sliter, [place]

## 2021-03-11 IMAGING — DX DG THORACIC SPINE 2V
3 series · 3 of 3 positions shown · non-contrast
Comparison: None.

CLINICAL DATA: Dorsalgia

EXAM:
THORACIC SPINE 3 VIEWS

[thoracic spine ap]
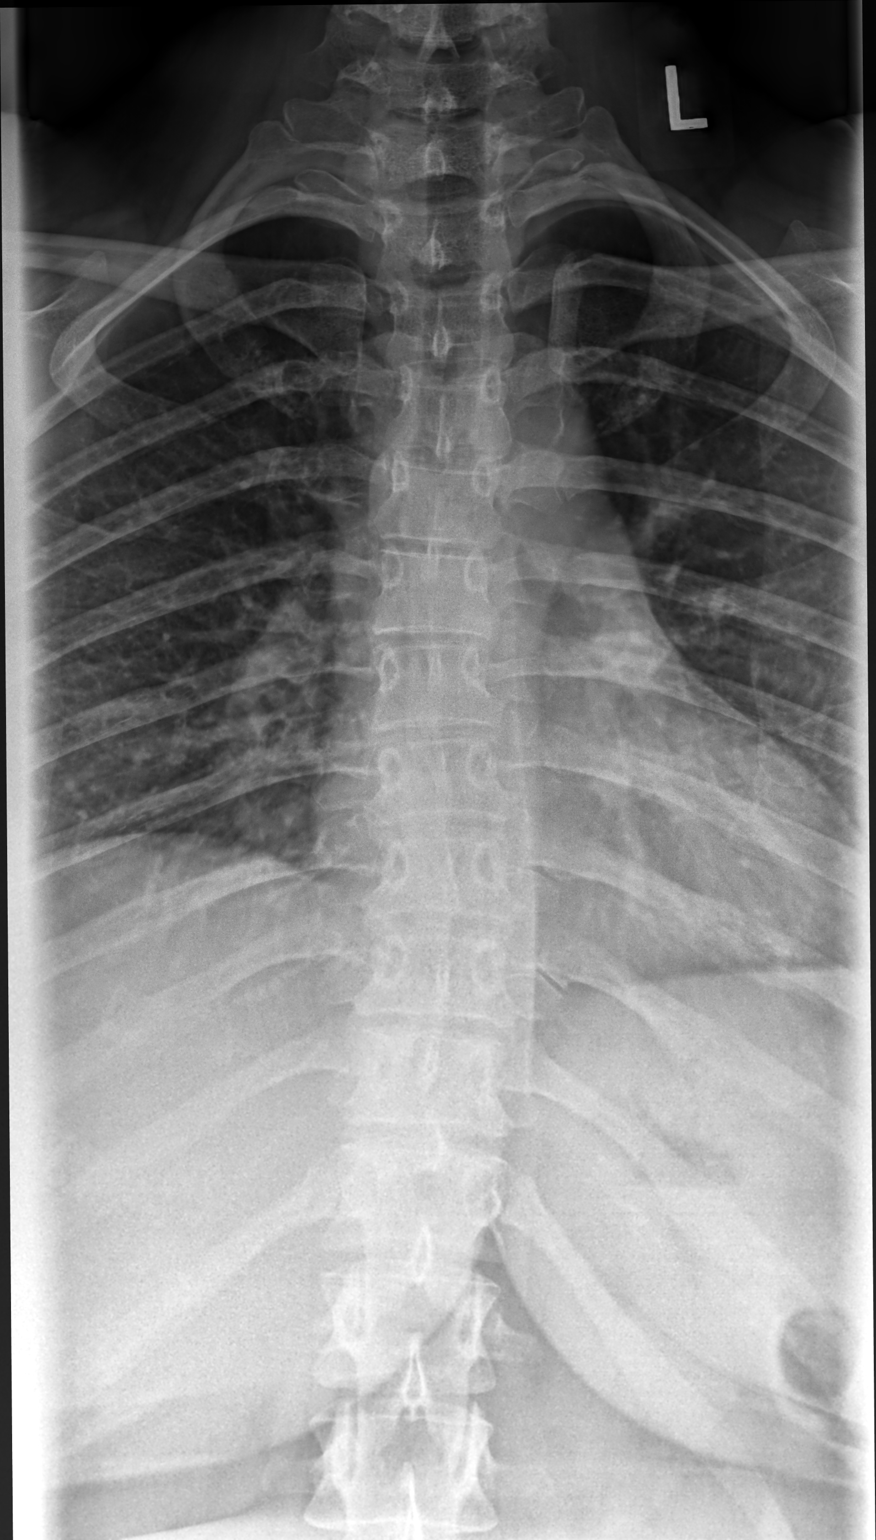

[thoracic spine standing lat]
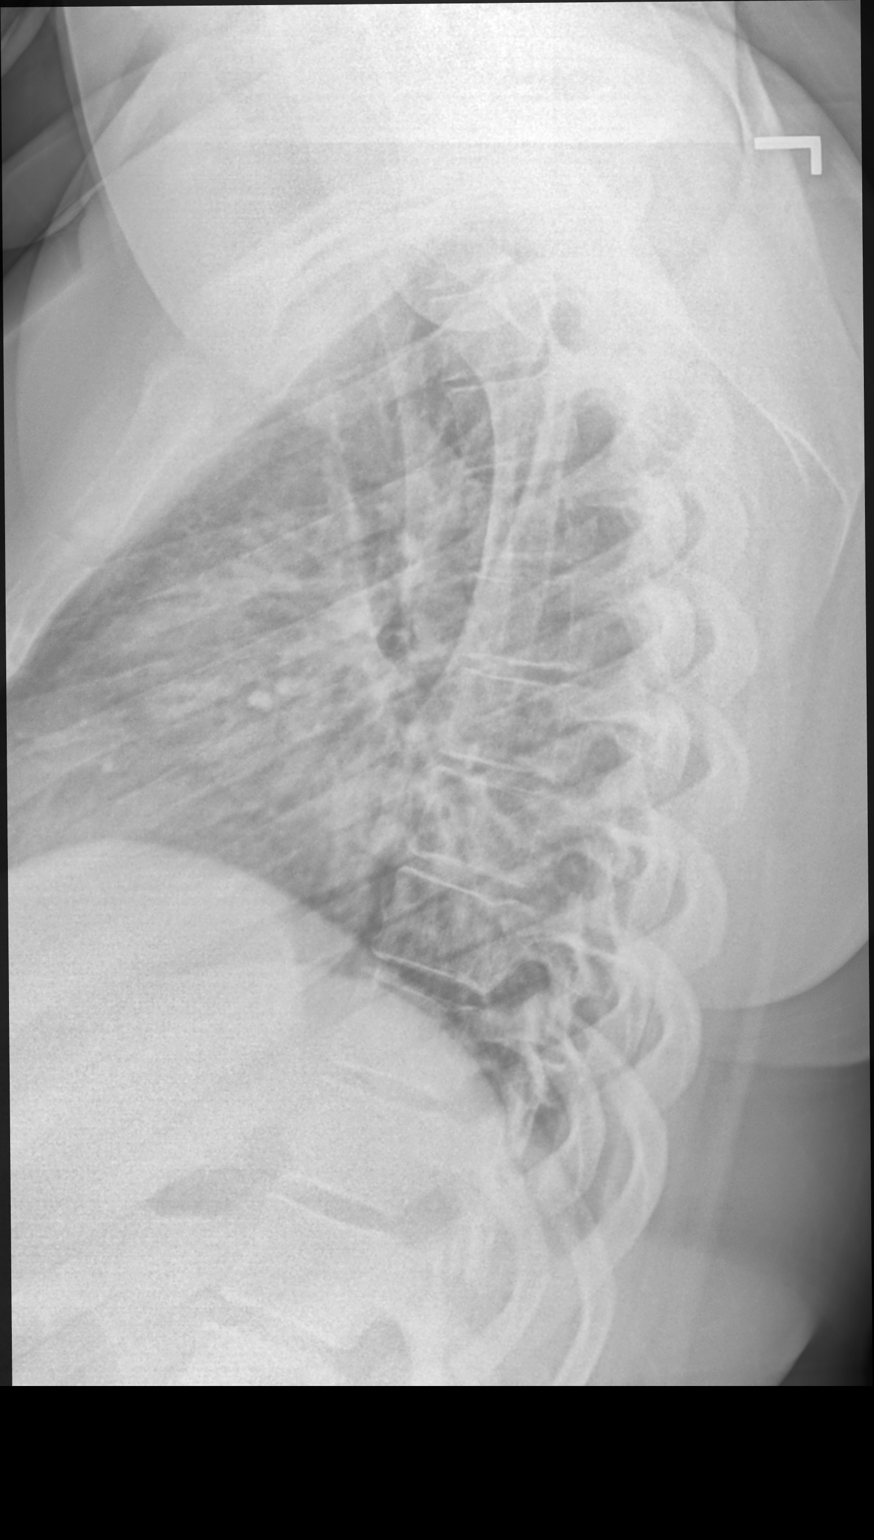

[cervical spine (swimmers) lat]
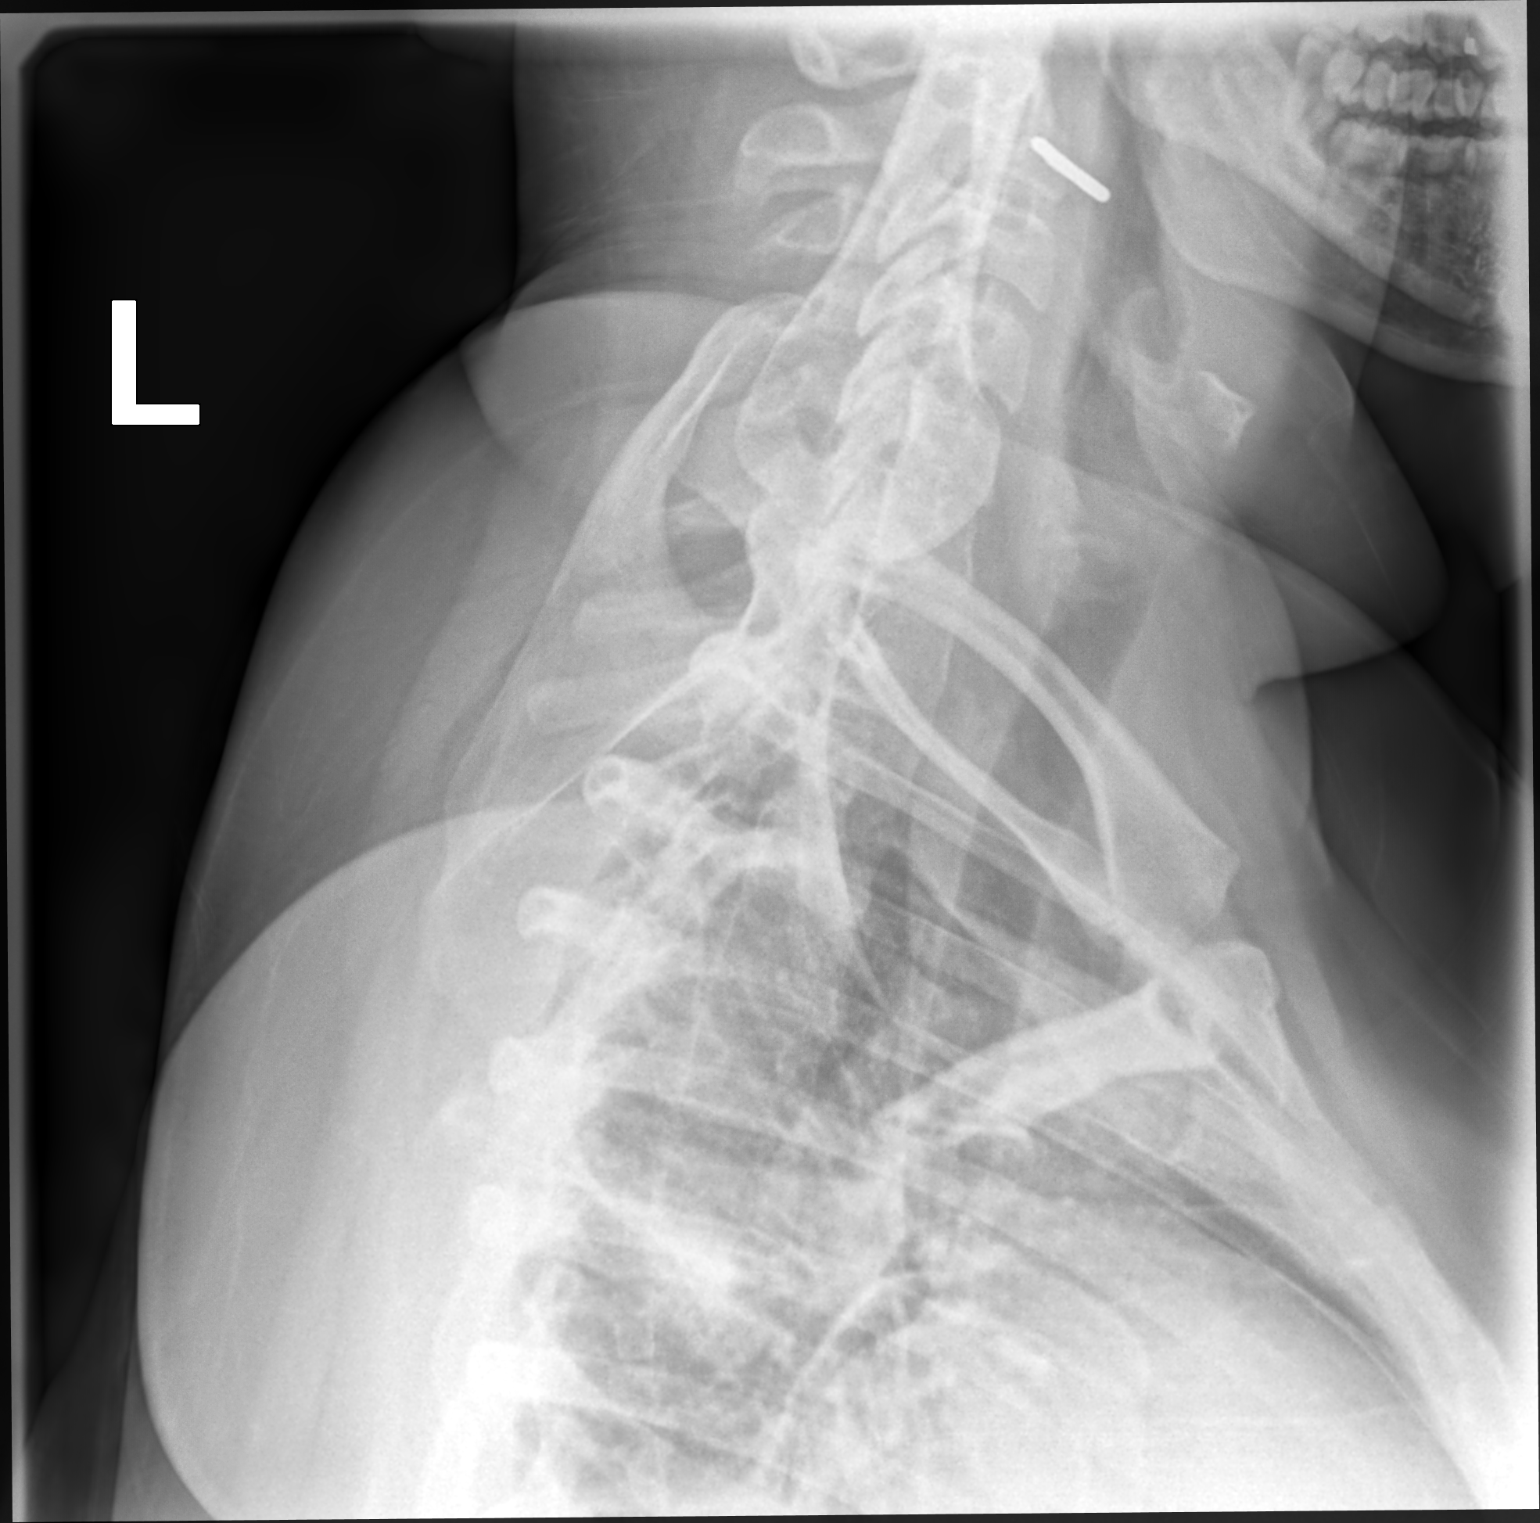

[3 of 3 positions shown; findings below may reference images not displayed]

FINDINGS: Frontal, lateral, and swimmer's views obtained. There is slight
lower thoracic levoscoliosis. There is no evident fracture or
spondylolisthesis. The disc spaces appear unremarkable. No erosive
change or paraspinous lesion. Visualized lungs clear.
IMPRESSION: Slight scoliosis. No fracture or spondylolisthesis. No evident
arthropathy.

## 2021-03-12 ENCOUNTER — Other Ambulatory Visit: Payer: Self-pay

## 2021-03-12 ENCOUNTER — Ambulatory Visit (INDEPENDENT_AMBULATORY_CARE_PROVIDER_SITE_OTHER): Payer: 59 | Admitting: Adult Health

## 2021-03-12 VITALS — BP 118/72 | HR 72 | Ht 68.0 in | Wt 355.0 lb

## 2021-03-12 DIAGNOSIS — Z9189 Other specified personal risk factors, not elsewhere classified: Secondary | ICD-10-CM | POA: Diagnosis not present

## 2021-03-12 DIAGNOSIS — Z6841 Body Mass Index (BMI) 40.0 and over, adult: Secondary | ICD-10-CM

## 2021-03-12 DIAGNOSIS — E559 Vitamin D deficiency, unspecified: Secondary | ICD-10-CM

## 2021-03-12 DIAGNOSIS — E8881 Metabolic syndrome: Secondary | ICD-10-CM | POA: Diagnosis not present

## 2021-03-12 MED ORDER — VITAMIN D (ERGOCALCIFEROL) 1.25 MG (50000 UNIT) PO CAPS: 50000.0000 [IU] | ORAL_CAPSULE | ORAL | 0 refills | Status: DC

## 2021-03-13 NOTE — Progress Notes (Signed)
Chief Complaint:   OBESITY Ashley Acevedo is here to discuss her progress with her obesity treatment plan along with follow-up of her obesity related diagnoses. Ashley Acevedo is on the Category 2 Plan and states she is following her eating plan approximately 70% of the time. Ashley Acevedo states she is walking 30 minutes 3 times per week.  Today's visit was #: 25 Starting weight: 295 lbs Starting date: 08/24/2019 Today's weight: 355 lbs Today's date: 03/12/2021 Total lbs lost to date: 0 Total lbs lost since last in-office visit: 0  Interim History: Ashley Acevedo has been off Victoza 1.2 mg for 5 days due to recent travel to [facility].  She stayed in a hotel and was limited with food choices. She has her bachelorette trip to [facility] this weekend.  Subjective:   1. Vitamin D deficiency Ashley Acevedo's Vitamin D level was 26.6 on 12/20/2020- well below goal of 50. She is currently taking prescription vitamin D 50,000 IU each week. She denies nausea, vomiting or muscle weakness.  2. Insulin resistance 12/20/2020 insulin level 19.7 with normal BG (76) and A1c (5.2). Ashley Acevedo is on Victoza 1.2 mg QD- been off for 5 days due to travel.  Lab Results  Component Value Date   INSULIN 19.7 12/20/2020   INSULIN 26.0 (H) 07/25/2020   INSULIN 26.0 (H) 04/11/2020   INSULIN 23.6 01/02/2020   INSULIN 27.6 (H) 08/24/2019   Lab Results  Component Value Date   HGBA1C 5.2 12/20/2020   3. At risk for nausea Ashley Acevedo is at risk for nausea due to GLP-1 for insulin resistance.  Assessment/Plan:   1. Vitamin D deficiency Low Vitamin D level contributes to fatigue and are associated with obesity, breast, and colon cancer. She agrees to continue to take prescription Vitamin D @50 ,000 IU every week and will follow-up for routine testing of Vitamin D, at least 2-3 times per year to avoid over-replacement. - Vitamin D, Ergocalciferol, (DRISDOL) 1.25 MG (50000 UNIT) CAPS capsule; Take 1 capsule (50,000 Units total) by mouth every 7  (seven) days.  Dispense: 4 capsule; Refill: 0  2. Insulin resistance Ashley Acevedo will continue to work on weight loss, exercise, and decreasing simple carbohydrates to help decrease the risk of diabetes. Ashley Acevedo agreed to follow-up with as directed to closely monitor her progress. -Restart Victoza 1.2 mg QD -Check fasting labs at next OV.  3. At risk for nausea Ashley Acevedo was given approximately 15 minutes of nausea prevention counseling today. Ashley Acevedo is at risk for nausea due to her new or current medication. She was encouraged to titrate her medication slowly, make sure to stay hydrated, eat smaller portions throughout the day, and avoid high fat meals.   4. Class 3 severe obesity with serious comorbidity and body mass index (BMI) of 50.0 to 59.9 in adult, unspecified obesity type [facility]) Ashley Acevedo is currently in the action stage of change. As such, her goal is to continue with weight loss efforts. She has agreed to the Category 2 Plan Monday through Friday and keeping a food journal and adhering to recommended goals of 1200 calories and 85 grams protein on Saturday and Sunday.   Check fasting labs at next OV.  Exercise goals: As is  Behavioral modification strategies: increasing lean protein intake, decreasing simple carbohydrates, increasing water intake, decreasing liquid calories, meal planning and cooking strategies, travel eating strategies, celebration eating strategies, planning for success and keeping a strict food journal.  Ashley Acevedo has agreed to follow-up with our clinic in 2 weeks,  fasting. She was informed of the importance of frequent follow-up visits to maximize her success with intensive lifestyle modifications for her multiple health conditions.   Objective:   Blood pressure 118/72, pulse 72, height 5\' 8"  (1.727 m), weight (!) 355 lb (161 kg), SpO2 95 %. Body mass index is 53.98 kg/m.  General: Cooperative, alert, well developed, in no acute distress. HEENT: Conjunctivae and  lids unremarkable. Cardiovascular: Regular rhythm.  Lungs: Normal work of breathing. Neurologic: No focal deficits.   Lab Results  Component Value Date   CREATININE 0.69 12/20/2020   BUN 13 12/20/2020   NA 138 12/20/2020   K 4.5 12/20/2020   CL 101 12/20/2020   CO2 20 12/20/2020   Lab Results  Component Value Date   ALT 13 12/20/2020   AST 12 12/20/2020   ALKPHOS 94 12/20/2020   BILITOT 0.3 12/20/2020   Lab Results  Component Value Date   HGBA1C 5.2 12/20/2020   HGBA1C 5.3 07/25/2020   HGBA1C 5.1 04/11/2020   HGBA1C 4.9 08/24/2019   Lab Results  Component Value Date   INSULIN 19.7 12/20/2020   INSULIN 26.0 (H) 07/25/2020   INSULIN 26.0 (H) 04/11/2020   INSULIN 23.6 01/02/2020   INSULIN 27.6 (H) 08/24/2019   No results found for: TSH Lab Results  Component Value Date   CHOL 237 (H) 12/20/2020   HDL 86 12/20/2020   LDLCALC 125 (H) 12/20/2020   TRIG 154 (H) 12/20/2020   CHOLHDL 2.8 12/20/2020   Lab Results  Component Value Date   WBC 8.1 12/20/2020   HGB 12.8 12/20/2020   HCT 39.4 12/20/2020   MCV 85 12/20/2020   PLT 350 12/20/2020   Lab Results  Component Value Date   IRON 76 12/20/2020   TIBC 416 12/20/2020   FERRITIN 23 12/20/2020    Attestation Statements:   Reviewed by clinician on day of visit: allergies, medications, problem list, medical history, surgical history, family history, social history, and previous encounter notes.  02/19/2021, CMA, am acting as transcriptionist for Edmund Hilda, NP.  I have reviewed the above documentation for accuracy and completeness, and I agree with the above. -  Terica Yogi d. Ernest Orr, NP-C

## 2021-03-14 ENCOUNTER — Encounter (INDEPENDENT_AMBULATORY_CARE_PROVIDER_SITE_OTHER): Payer: Self-pay

## 2021-03-26 ENCOUNTER — Ambulatory Visit (INDEPENDENT_AMBULATORY_CARE_PROVIDER_SITE_OTHER): Payer: 59 | Admitting: Adult Health

## 2021-04-19 ENCOUNTER — Encounter (INDEPENDENT_AMBULATORY_CARE_PROVIDER_SITE_OTHER): Payer: Self-pay | Admitting: Adult Health

## 2021-04-22 ENCOUNTER — Ambulatory Visit (INDEPENDENT_AMBULATORY_CARE_PROVIDER_SITE_OTHER): Payer: 59 | Admitting: Adult Health

## 2021-04-23 ENCOUNTER — Ambulatory Visit (INDEPENDENT_AMBULATORY_CARE_PROVIDER_SITE_OTHER): Payer: 59 | Admitting: Adult Health

## 2021-04-30 ENCOUNTER — Encounter: Payer: Self-pay | Admitting: Family Medicine

## 2021-04-30 ENCOUNTER — Telehealth (INDEPENDENT_AMBULATORY_CARE_PROVIDER_SITE_OTHER): Payer: 59 | Admitting: Family Medicine

## 2021-04-30 DIAGNOSIS — Z20822 Contact with and (suspected) exposure to covid-19: Secondary | ICD-10-CM

## 2021-04-30 DIAGNOSIS — J029 Acute pharyngitis, unspecified: Secondary | ICD-10-CM

## 2021-04-30 NOTE — Progress Notes (Signed)
Virtual Visit via Video Note  Subjective  CC:  Chief Complaint  Patient presents with   Possible Covid    Fever, chills, sore throat and nasal congestion. Patient states that she hasn't tested positive since Sunday night so she doesn't know what's truly going on but she thinks its covid. She is going to [facility] today for another TEST  Also states that her throat is very sore and she feels like she's swallowing knives.     I connected with [name] on 04/30/21 at  8:30 AM EDT by a video enabled telemedicine application and verified that I am speaking with the correct person using two identifiers. Location patient: Home Location provider: [facility] [address], Office Persons participating in the virtual visit: Marilla E Lyda Kalata, Willow Ora, MD [facility] CMA  I discussed the limitations of evaluation and management by telemedicine and the availability of in person appointments. The patient expressed understanding and agreed to proceed. HPI: Ashley Acevedo is a 25 y.o. female who was contacted today to address the problems listed above in the chief complaint. 25 year old with mild intermittent asthma, insulin resistance and obesity complains of 2-day history of sore throat with fever up to 101.  She was exposed to COVID by her fianc who tested +4 days prior.  He is doing well.  Patient is fully vaccinated with a third booster against COVID.  She complains of sore throat mild postnasal drainage and low-grade fevers.  Her fevers are responsive to Tylenol.  She is been using Mucinex for the drainage.  Her sore throat is her worst symptoms.  She is having no respiratory cough or shortness of breath although she did use her inhaler yesterday once.  No appetite changes.  No myalgias.  Overall feels that she is feeling better today than yesterday.  No known exposures to strep nor mono.  Of note, she is tested with home rapid test 3 times, all the test results  were negative.  She does have a PCR test scheduled for today  Assessment  1. Suspected COVID-19 virus infection   2. Exposure to confirmed case of COVID-19   3. Sore throat      Plan  Suspected COVID infection with exposure: Mild symptoms.  Discussed her risk factors.  Discussed need to test positive and consideration of antiviral.  We both agree that risk outweigh benefits at this point.  Treat supportively with Advil, fluids, rest, Mucinex.  She will follow-up with me if symptoms worsen I discussed the assessment and treatment plan with the patient. The patient was provided an opportunity to ask questions and all were answered. The patient agreed with the plan and demonstrated an understanding of the instructions.   The patient was advised to call back or seek an in-person evaluation if the symptoms worsen or if the condition fails to improve as anticipated. Follow up: As needed Visit date not found  No orders of the defined types were placed in this encounter.     I reviewed the patients updated PMH, FH, and SocHx.    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   b complex vitamins capsule Take by mouth.   cetirizine (ZYRTEC) 10 MG tablet Take 10 mg by mouth daily.   drospirenone-ethinyl estradiol (YAZ,GIANVI,LORYNA) 3-0.02 MG tablet Take  1 tablet by mouth daily.   escitalopram (LEXAPRO) 10 MG tablet TAKE 1 TABLET BY MOUTH EVERY DAY   fluticasone (FLONASE) 50 MCG/ACT nasal spray Place 1 spray into both nostrils daily.   ibuprofen (ADVIL) 800 MG tablet Take 800 mg by mouth every 8 (eight) hours as needed.   liraglutide (VICTOZA) 18 MG/3ML SOPN Inject 1.2 mg into the skin daily.   pantoprazole (PROTONIX) 40 MG tablet Take 1 tablet (40 mg total) by mouth daily.   Vitamin D, Ergocalciferol, (DRISDOL) 1.25 MG (50000 UNIT) CAPS capsule Take 1 capsule (50,000 Units total) by mouth every 7 (seven) days.    Allergies: Patient is allergic to clindamycin, codeine, hydrocodone, and prednisone. Family History: Patient family history includes Alzheimer's disease in her maternal grandfather; Asthma in her maternal grandmother and mother; Atrial fibrillation in her father; Brain cancer in her paternal grandmother; Colon cancer in her maternal grandfather; Hyperlipidemia in her father; Lung cancer in her maternal grandfather and paternal grandfather; Obesity in her mother; Prostate cancer in her maternal grandfather; Stomach cancer in her maternal grandmother. Social History:  Patient  reports that she has never smoked. She has never used smokeless tobacco. She reports current alcohol use. She reports that she does not use drugs.  Review of Systems: Constitutional: Negative for malaise or anorexia Cardiovascular: negative for chest pain Respiratory: negative for SOB or persistent cough Gastrointestinal: negative for abdominal pain  OBJECTIVE Vitals: LMP 04/12/2021  General: no acute distress , A&Ox3, hoarse voice but speaking clearly.  No respiratory distress.  Appears nontoxic Reports she is afebrile this morning without Tylenol  Willow Ora, MD

## 2021-05-01 MED ORDER — PREDNISONE 20 MG PO TABS: ORAL_TABLET | ORAL | 0 refills | Status: DC

## 2021-05-01 MED ORDER — NIRMATRELVIR/RITONAVIR (PAXLOVID)TABLET: 3.0000 | ORAL_TABLET | Freq: Two times a day (BID) | ORAL | 0 refills | Status: DC

## 2021-05-01 NOTE — Addendum Note (Signed)
Addended by: Asencion Partridge on: 05/01/2021 12:12 PM   Modules accepted: Orders

## 2021-05-01 NOTE — Telephone Encounter (Signed)
Dr. Mardelle Matte, please see message. Pt had an appt yesterday with you. Please advise.

## 2021-05-03 ENCOUNTER — Other Ambulatory Visit (INDEPENDENT_AMBULATORY_CARE_PROVIDER_SITE_OTHER): Payer: Self-pay | Admitting: Adult Health

## 2021-05-03 DIAGNOSIS — F3289 Other specified depressive episodes: Secondary | ICD-10-CM

## 2021-05-06 NOTE — Telephone Encounter (Signed)
LAST APPOINTMENT DATE: 03/12/2021 NEXT APPOINTMENT DATE: 05/14/2021   [id number] [place], [place] - [address] AT [facility] [address] [place] [address] Phone: [phone] Fax: [phone]  [id number] - [place], [place] - [address] [place] [address] Phone: [phone] Fax: [phone]  Patient is requesting a refill of the following medications: Requested Prescriptions   Pending Prescriptions Disp Refills   escitalopram (LEXAPRO) 10 MG tablet [Pharmacy Med Name: ESCITALOPRAM 10 MG TABLET] 90 tablet 0    Sig: TAKE 1 TABLET BY MOUTH EVERY DAY    Date last filled: 01/08/2021 90 tabs Previously prescribed by [facility]  Lab Results  Component Value Date   HGBA1C 5.2 12/20/2020   HGBA1C 5.3 07/25/2020   HGBA1C 5.1 04/11/2020   Lab Results  Component Value Date   LDLCALC 125 (H) 12/20/2020   CREATININE 0.69 12/20/2020   Lab Results  Component Value Date   VD25OH 26.6 (L) 12/20/2020   VD25OH 38.9 07/25/2020   VD25OH 30.8 04/11/2020    BP Readings from Last 3 Encounters:  03/12/21 118/72  02/27/21 116/67  02/13/21 129/78

## 2021-05-06 NOTE — Telephone Encounter (Signed)
Please see new message

## 2021-05-06 NOTE — Telephone Encounter (Signed)
Last seen Katy 

## 2021-05-07 ENCOUNTER — Ambulatory Visit (INDEPENDENT_AMBULATORY_CARE_PROVIDER_SITE_OTHER): Payer: 59 | Admitting: Adult Health

## 2021-05-14 ENCOUNTER — Ambulatory Visit (INDEPENDENT_AMBULATORY_CARE_PROVIDER_SITE_OTHER): Payer: 59 | Admitting: Adult Health

## 2021-05-14 ENCOUNTER — Encounter (INDEPENDENT_AMBULATORY_CARE_PROVIDER_SITE_OTHER): Payer: Self-pay | Admitting: Adult Health

## 2021-05-14 ENCOUNTER — Other Ambulatory Visit: Payer: Self-pay

## 2021-05-14 VITALS — BP 128/84 | HR 102 | Temp 98.0°F | Ht 68.0 in | Wt 356.0 lb

## 2021-05-14 DIAGNOSIS — E782 Mixed hyperlipidemia: Secondary | ICD-10-CM | POA: Diagnosis not present

## 2021-05-14 DIAGNOSIS — E559 Vitamin D deficiency, unspecified: Secondary | ICD-10-CM

## 2021-05-14 DIAGNOSIS — E88819 Insulin resistance, unspecified: Secondary | ICD-10-CM

## 2021-05-14 DIAGNOSIS — E538 Deficiency of other specified B group vitamins: Secondary | ICD-10-CM

## 2021-05-14 DIAGNOSIS — E8881 Metabolic syndrome: Secondary | ICD-10-CM | POA: Diagnosis not present

## 2021-05-14 DIAGNOSIS — Z6841 Body Mass Index (BMI) 40.0 and over, adult: Secondary | ICD-10-CM

## 2021-05-14 DIAGNOSIS — Z9189 Other specified personal risk factors, not elsewhere classified: Secondary | ICD-10-CM | POA: Diagnosis not present

## 2021-05-14 MED ORDER — VICTOZA 18 MG/3ML ~~LOC~~ SOPN: 0.6000 mg | PEN_INJECTOR | Freq: Every day | SUBCUTANEOUS | 0 refills | Status: DC

## 2021-05-14 MED ORDER — B COMPLEX VITAMINS PO CAPS: 1.0000 | ORAL_CAPSULE | Freq: Every day | ORAL | 1 refills | Status: DC

## 2021-05-20 NOTE — Progress Notes (Signed)
Chief Complaint:   OBESITY Ashley Acevedo is here to discuss her progress with her obesity treatment plan along with follow-up of her obesity related diagnoses. Ashley Acevedo is on the Category 2 Plan Monday through Friday and keeping a food journal and adhering to recommended goals of 1200 calories and 85 grams protein on weekends and states she is following her eating plan approximately 30% of the time. Ashley Acevedo states she is not currently exercising.  Today's visit was #: 26 Starting weight: 295 lbs Starting date: 08/24/2019 Today's weight: 356 lbs Today's date: 05/14/2021 Total lbs lost to date: 0 Total lbs lost since last in-office visit: 0  Interim History: Ashley Acevedo is recovering from recent COVID-19 infection. Paxlovid was ordered but pt never used prescription.  Her symptoms were treated with prednisone, Advil, and Mucinex.  Her onely lingering symptom is fatigue.  Subjective:   1. Vitamin D deficiency 12/20/2020 Vit D level was 26.6- well below goal of 50. She is currently taking prescription vitamin D 50,000 IU each week. She denies nausea, vomiting or muscle weakness.   Lab Results  Component Value Date   VD25OH 26.6 (L) 12/20/2020   VD25OH 38.9 07/25/2020   VD25OH 30.8 04/11/2020   2. B12 nutritional deficiency Ashley Acevedo is on B12 supplementation.  Lab Results  Component Value Date   VITAMINB12 396 12/20/2020   3. Mixed hyperlipidemia 12/20/2020 lipid panel revealed total, triglycerides, and LDL were all above goal. She is not on statin therapy.  Lab Results  Component Value Date   ALT 13 12/20/2020   AST 12 12/20/2020   ALKPHOS 94 12/20/2020   BILITOT 0.3 12/20/2020   Lab Results  Component Value Date   CHOL 237 (H) 12/20/2020   HDL 86 12/20/2020   LDLCALC 125 (H) 12/20/2020   TRIG 154 (H) 12/20/2020   CHOLHDL 2.8 12/20/2020   4. Insulin resistance She held Victoza 1.2 mg for over 4 weeks, due to recent COVID-19 infection.  Lab Results  Component Value Date   INSULIN  19.7 12/20/2020   INSULIN 26.0 (H) 07/25/2020   INSULIN 26.0 (H) 04/11/2020   INSULIN 23.6 01/02/2020   INSULIN 27.6 (H) 08/24/2019   Lab Results  Component Value Date   HGBA1C 5.2 12/20/2020   5. At risk for heart disease Ashley Acevedo is at a higher than average risk for cardiovascular disease due to obesity, hyperlipidemia, and insulin resistance.  Assessment/Plan:   1. Vitamin D deficiency Low Vitamin D level contributes to fatigue and are associated with obesity, breast, and colon cancer. She agrees to continue to take prescription Vitamin D 50,000 IU every week and will follow-up for routine testing of Vitamin D, at least 2-3 times per year to avoid over-replacement. Check labs at next OV.  2. B12 nutritional deficiency The diagnosis was reviewed with the patient. Counseling provided today, see below. We will continue to monitor. Orders and follow up as documented in patient record. Check labs at next OV.  Counseling The body needs vitamin B12: to make red blood cells; to make DNA; and to help the nerves work properly so they can carry messages from the brain to the body.  The main causes of vitamin B12 deficiency include dietary deficiency, digestive diseases, pernicious anemia, and having a surgery in which part of the stomach or small intestine is removed.  Certain medicines can make it harder for the body to absorb vitamin B12. These medicines include: heartburn medications; some antibiotics; some medications used to treat diabetes, gout, and high cholesterol.  In some cases, there are no symptoms of this condition. If the condition leads to anemia or nerve damage, various symptoms can occur, such as weakness or fatigue, shortness of breath, and numbness or tingling in your hands and feet.   Treatment:  May include taking vitamin B12 supplements.  Avoid alcohol.  Eat lots of healthy foods that contain vitamin B12: Beef, pork, chicken, [place], and organ meats, such as liver.  Seafood:  This includes clams, rainbow trout, salmon, tuna, and haddock. Eggs.  Cereal and dairy products that are fortified: This means that vitamin B12 has been added to the food.   Refill- b complex vitamins capsule; Take 1 capsule by mouth daily.  Dispense: 90 capsule; Refill: 1  3. Mixed hyperlipidemia Cardiovascular risk and specific lipid/LDL goals reviewed.  We discussed several lifestyle modifications today and Ashley Acevedo will continue to work on diet, exercise and weight loss efforts. Orders and follow up as documented in patient record. Check labs at next OV.  Counseling Intensive lifestyle modifications are the first line treatment for this issue. Dietary changes: Increase soluble fiber. Decrease simple carbohydrates. Exercise changes: Moderate to vigorous-intensity aerobic activity 150 minutes per week if tolerated. Lipid-lowering medications: see documented in medical record.  4. Insulin resistance Ashley Acevedo will restart Victoza 0.6 mg QD- hold at this dose until next OV. She will continue to work on weight loss, exercise, and decreasing simple carbohydrates to help decrease the risk of diabetes. Ashley Acevedo agreed to follow-up with [place] as directed to closely monitor her progress.  Refill- liraglutide (VICTOZA) 18 MG/3ML SOPN; Inject 0.6 mg into the skin daily.  Dispense: 3 mL; Refill: 0  5. At risk for heart disease Ashley Acevedo was given approximately 15 minutes of coronary artery disease prevention counseling today. She is 25 y.o. female and has risk factors for heart disease including obesity. We discussed intensive lifestyle modifications today with an emphasis on specific weight loss instructions and strategies.   Repetitive spaced learning was employed today to elicit superior memory formation and behavioral change.  6. Class 3 severe obesity with serious comorbidity and body mass index (BMI) of 50.0 to 59.9 in adult, unspecified obesity type [facility])  Ashley Acevedo is currently in the action stage of change. As  such, her goal is to continue with weight loss efforts. She has agreed to the Category 2 Plan Monday through Friday and keeping a food journal and adhering to recommended goals of 1200 calories and 85 grams protein.   Exercise goals:  Increase daily walking  Behavioral modification strategies: increasing lean protein intake, decreasing simple carbohydrates, meal planning and cooking strategies, keeping healthy foods in the home, and planning for success.  Ashley Acevedo has agreed to follow-up with our clinic in 3 weeks- fasting. She was informed of the importance of frequent follow-up visits to maximize her success with intensive lifestyle modifications for her multiple health conditions.   Objective:   Blood pressure 128/84, pulse (!) 102, temperature 98 F (36.7 C), height 5\' 8"  (1.727 m), weight (!) 356 lb (161.5 kg), SpO2 97 %. Body mass index is 54.13 kg/m.  General: Cooperative, alert, well developed, in no acute distress. HEENT: Conjunctivae and lids unremarkable. Cardiovascular: Regular rhythm.  Lungs: Normal work of breathing. Neurologic: No focal deficits.   Lab Results  Component Value Date   CREATININE 0.69 12/20/2020   BUN 13 12/20/2020   NA 138 12/20/2020   K 4.5 12/20/2020   CL 101 12/20/2020   CO2 20 12/20/2020   Lab Results  Component Value  Date   ALT 13 12/20/2020   AST 12 12/20/2020   ALKPHOS 94 12/20/2020   BILITOT 0.3 12/20/2020   Lab Results  Component Value Date   HGBA1C 5.2 12/20/2020   HGBA1C 5.3 07/25/2020   HGBA1C 5.1 04/11/2020   HGBA1C 4.9 08/24/2019   Lab Results  Component Value Date   INSULIN 19.7 12/20/2020   INSULIN 26.0 (H) 07/25/2020   INSULIN 26.0 (H) 04/11/2020   INSULIN 23.6 01/02/2020   INSULIN 27.6 (H) 08/24/2019   No results found for: TSH Lab Results  Component Value Date   CHOL 237 (H) 12/20/2020   HDL 86 12/20/2020   LDLCALC 125 (H) 12/20/2020   TRIG 154 (H) 12/20/2020   CHOLHDL 2.8 12/20/2020   Lab Results  Component  Value Date   VD25OH 26.6 (L) 12/20/2020   VD25OH 38.9 07/25/2020   VD25OH 30.8 04/11/2020   Lab Results  Component Value Date   WBC 8.1 12/20/2020   HGB 12.8 12/20/2020   HCT 39.4 12/20/2020   MCV 85 12/20/2020   PLT 350 12/20/2020   Lab Results  Component Value Date   IRON 76 12/20/2020   TIBC 416 12/20/2020   FERRITIN 23 12/20/2020   Attestation Statements:   Reviewed by clinician on day of visit: allergies, medications, problem list, medical history, surgical history, family history, social history, and previous encounter notes.  Edmund Hilda, CMA, am acting as transcriptionist for William Hamburger, NP.  I have reviewed the above documentation for accuracy and completeness, and I agree with the above. -  Adetokunbo Mccadden d. Daneya Hartgrove, NP-C

## 2021-05-24 ENCOUNTER — Encounter: Payer: Self-pay | Admitting: Allergy

## 2021-05-24 ENCOUNTER — Other Ambulatory Visit: Payer: Self-pay

## 2021-05-24 ENCOUNTER — Ambulatory Visit: Payer: 59 | Admitting: Allergy

## 2021-05-24 VITALS — BP 136/86 | HR 116 | Temp 97.1°F | Ht 68.0 in | Wt 364.4 lb

## 2021-05-24 DIAGNOSIS — J4540 Moderate persistent asthma, uncomplicated: Secondary | ICD-10-CM

## 2021-05-24 DIAGNOSIS — J3089 Other allergic rhinitis: Secondary | ICD-10-CM

## 2021-05-24 DIAGNOSIS — T781XXD Other adverse food reactions, not elsewhere classified, subsequent encounter: Secondary | ICD-10-CM

## 2021-05-24 MED ORDER — AIRDUO DIGIHALER 232-14 MCG/ACT IN AEPB: 1.0000 | INHALATION_SPRAY | Freq: Two times a day (BID) | RESPIRATORY_TRACT | 5 refills | Status: DC

## 2021-05-24 NOTE — Progress Notes (Signed)
New Patient Note  RE: Ashley Acevedo MRN: [id number] DOB: 02-11-1996 Date of Office Visit: 05/24/2021  Primary care provider: Jarold Motto, PA  Chief Complaint: worsening asthma  History of present illness: Ashley Acevedo is a 25 y.o. female presenting today for evaluation of asthma.  She is a former patient of the practice with last visit in June 2019 were she underwent patch testing for contact dermatitis.  She has history of asthma that had been quiescent for a while until she had COVID recently. She had covid last month.  Since, her asthma has been poorly controlled.   She states she has had to use her albuterol more often.  She did take prednisone x 5 days with her covid illness.  She did have a prescription for Paxlovid but did not get this filled or take it. She reports symptoms with her COVID illness initially included sore throat, fever, chills but denies having cough.  Some shortness of breath but states symptoms are worse now.   She states the shortness of breath is much worse as well as cough.  Even 2 flights of stairs can worsen her shortness of breath.  The cough can be productive. She also reports wheezing and chest tightness.  Symptoms worse at night. She is noticing nighttime awakenings also.  Using albuterol inhaler about twice a day at this time with relief of symptoms.   Prior to covid illness she believes it had been years since she needed to use albuterol.   She has taken singulair in the past and states it actually made her wheeze.    No history of eczema.   She takes zyrtec daily and flonase daily.  Will use an as needed allergy eye drop over-the-counter.  She reports symptoms of itchy eyes, itchy ears, sneezing and runny nose.   She avoids pinaepples as had made her mouth itch.   She is getting married in September.  Review of systems: Review of Systems  Constitutional: Negative.   HENT: Negative.    Eyes: Negative.   Respiratory:  Positive for cough,  sputum production, shortness of breath and wheezing. Negative for hemoptysis.   Cardiovascular: Negative.   Gastrointestinal: Negative.   Musculoskeletal: Negative.   Skin: Negative.   Neurological: Negative.    All other systems negative unless noted above in HPI  Past medical history: Past Medical History:  Diagnosis Date   Allergies    Anxiety    Asthma    Back pain    GERD (gastroesophageal reflux disease)    Heartburn    History of stomach ulcers    Hyperlipidemia    Joint pain     Past surgical history: Past Surgical History:  Procedure Laterality Date   WISDOM TOOTH EXTRACTION      Family history:  Family History  Problem Relation Age of Onset   Asthma Mother    Obesity Mother    Asthma Maternal Grandmother    Stomach cancer Maternal Grandmother    Hyperlipidemia Father    Atrial fibrillation Father        had ablation and is now in rhythm   Lung cancer Maternal Grandfather    Prostate cancer Maternal Grandfather    Colon cancer Maternal Grandfather        late 60's/early 70's   Alzheimer's disease Maternal Grandfather    Brain cancer Paternal Grandmother    Lung cancer Paternal Grandfather    Allergic rhinitis Neg Hx    Angioedema Neg Hx  Eczema Neg Hx    Immunodeficiency Neg Hx    Urticaria Neg Hx     Social history: Lives in a home without carpeting with gas heating and central cooling.  Dog in the home.  There is no concern for water damage, mildew or roaches in the home.  She works as a document and Museum/gallery exhibitions officer.  She denies a smoking history.   Medication List: Current Outpatient Medications  Medication Sig Dispense Refill   albuterol (PROAIR HFA) 108 (90 Base) MCG/ACT inhaler Inhale 2 puffs into the lungs every 6 (six) hours as needed for wheezing or shortness of breath. 18 g 3   b complex vitamins capsule Take 1 capsule by mouth daily. 90 capsule 1   cetirizine (ZYRTEC) 10 MG tablet Take 10 mg by mouth daily.      drospirenone-ethinyl estradiol (YAZ,GIANVI,LORYNA) 3-0.02 MG tablet Take 1 tablet by mouth daily.     escitalopram (LEXAPRO) 10 MG tablet TAKE 1 TABLET BY MOUTH EVERY DAY 90 tablet 0   fluticasone (FLONASE) 50 MCG/ACT nasal spray Place 1 spray into both nostrils daily.     Fluticasone-Salmeterol,sensor, (AIRDUO DIGIHALER) 232-14 MCG/ACT AEPB Inhale 1 puff into the lungs 2 (two) times daily. 1 each 5   ibuprofen (ADVIL) 800 MG tablet Take 800 mg by mouth every 8 (eight) hours as needed.     liraglutide (VICTOZA) 18 MG/3ML SOPN Inject 0.6 mg into the skin daily. 3 mL 0   Vitamin D, Ergocalciferol, (DRISDOL) 1.25 MG (50000 UNIT) CAPS capsule Take 1 capsule (50,000 Units total) by mouth every 7 (seven) days. 4 capsule 0   No current facility-administered medications for this visit.    Known medication allergies: Allergies  Allergen Reactions   Clindamycin Rash   Codeine Rash   Hydrocodone Rash     Physical examination: Blood pressure 136/86, pulse (!) 116, temperature (!) 97.1 F (36.2 C), temperature source Temporal, height 5\' 8"  (1.727 m), weight (!) 364 lb 6.4 oz (165.3 kg), SpO2 96 %.  General: Alert, interactive, in no acute distress. HEENT: PERRLA, TMs pearly gray, turbinates non-edematous without discharge, post-pharynx non erythematous. Neck: Supple without lymphadenopathy. Lungs: Clear to auscultation without wheezing, rhonchi or rales. {no increased work of breathing. CV: Normal S1, S2 without murmurs. Abdomen: Nondistended, nontender. Skin: Warm and dry, without lesions or rashes. Extremities:  No clubbing, cyanosis or edema. Neuro:   Grossly intact.  Diagnositics/Labs:  Spirometry: FEV1: 2.9 L or 80%, FVC: 3.49 L 81%, ratio consistent with nonobstructive pattern.  Upon review of previous spirometry she has had better lung function with FEV1 of 100% plus.  Status post albuterol she had an improvement in FEV1 to 83% predicted which is not a significant  response   Assessment and plan: Asthma exacerbation secondary to COVID   -asthma currently poorly controlled after Covid illness -start AirDuo 274mcg/14mg  take 1 puff twice a day.  Sample provided with copay card.  Rinse mouth after use -have access to albuterol inhaler 2 puffs every 4-6 hours as needed for cough/wheeze/shortness of breath/chest tightness.  May use 15-20 minutes prior to activity.   Monitor frequency of use.    Asthma control goals:  Full participation in all desired activities (may need albuterol before activity) Albuterol use two time or less a week on average (not counting use with activity) Cough interfering with sleep two time or less a month Oral steroids no more than once a year No hospitalizations  -if you are not noticing improvement in symptoms by  next Friday let us know and would recommend imaging with CXR  Allergic rhinitis with oral allergy syndrome -continue Zyrtec and Flonase use daily -continue avoidance pineapple most likely due to oral allergy syndrome  Follow-up in 4 weeks or sooner if needed  I appreciate the opportunity to take part in Ashley Acevedo's care. Please do not hesitate to contact me with questions.  Sincerely,   Margo Aye, MD Allergy/[facility]ology [facility]

## 2021-05-24 NOTE — Patient Instructions (Signed)
-  asthma currently poorly controlled after Covid illness -start AirDuo 274mcg/14mg  take 1 puff twice a day.  Sample provided with copay card.  Rinse mouth after use -have access to albuterol inhaler 2 puffs every 4-6 hours as needed for cough/wheeze/shortness of breath/chest tightness.  May use 15-20 minutes prior to activity.   Monitor frequency of use.    Asthma control goals:  Full participation in all desired activities (may need albuterol before activity) Albuterol use two time or less a week on average (not counting use with activity) Cough interfering with sleep two time or less a month Oral steroids no more than once a year No hospitalizations  -if you are not noticing improvement in symptoms by next Friday let us know and would recommend imaging with CXR  -continue Zyrtec and Flonase use daily  -continue avoidance pineapple most likely due to oral allergy syndrome  Follow-up in 4 weeks or sooner if needed

## 2021-05-29 ENCOUNTER — Ambulatory Visit: Payer: 59 | Admitting: Allergy

## 2021-05-30 ENCOUNTER — Other Ambulatory Visit: Payer: Self-pay | Admitting: Physician Assistant

## 2021-05-31 ENCOUNTER — Encounter: Payer: Self-pay | Admitting: Physician Assistant

## 2021-06-04 ENCOUNTER — Other Ambulatory Visit: Payer: Self-pay

## 2021-06-04 ENCOUNTER — Ambulatory Visit (INDEPENDENT_AMBULATORY_CARE_PROVIDER_SITE_OTHER): Payer: 59 | Admitting: Adult Health

## 2021-06-04 MED ORDER — PANTOPRAZOLE SODIUM 40 MG PO TBEC: 40.0000 mg | DELAYED_RELEASE_TABLET | Freq: Every day | ORAL | 1 refills | Status: DC

## 2021-06-10 ENCOUNTER — Encounter: Payer: Self-pay | Admitting: Family Medicine

## 2021-06-10 ENCOUNTER — Telehealth: Payer: Self-pay | Admitting: *Deleted

## 2021-06-10 DIAGNOSIS — J4540 Moderate persistent asthma, uncomplicated: Secondary | ICD-10-CM

## 2021-06-10 NOTE — Telephone Encounter (Signed)
Can you please order a cxr 2 view no contrast? Please have her continue AirDuo 1 puff twice a day and albuterol as needed. She should begin nasal saline rinses followed by Flonase 2 sprays in each nostril once a day. Is she having any reflux? Thank you

## 2021-06-10 NOTE — Telephone Encounter (Signed)
Delsym bid prn is fine. Thank you

## 2021-06-10 NOTE — Telephone Encounter (Signed)
Chest X Ray per Thurston Hole.

## 2021-06-10 NOTE — Telephone Encounter (Signed)
Pt requested that she is not getting any better and was told to call back to schedule a chest scan

## 2021-06-11 ENCOUNTER — Other Ambulatory Visit: Payer: Self-pay

## 2021-06-11 ENCOUNTER — Ambulatory Visit
Admission: RE | Admit: 2021-06-11 | Discharge: 2021-06-11 | Disposition: A | Discharge status: Home / Self Care | Payer: 59 | Source: Ambulatory Visit | Attending: Family Medicine | Admitting: Family Medicine

## 2021-06-11 ENCOUNTER — Other Ambulatory Visit: Payer: Self-pay | Admitting: Family Medicine

## 2021-06-11 MED ORDER — TRELEGY ELLIPTA 200-62.5-25 MCG/INH IN AEPB: 1.0000 | INHALATION_SPRAY | Freq: Every day | RESPIRATORY_TRACT | 1 refills | Status: DC

## 2021-06-11 NOTE — Telephone Encounter (Signed)
Can you please let this patient know that her chest xray looks normal. Please ask her if she is having any reflux. Thank you

## 2021-06-11 NOTE — Telephone Encounter (Signed)
I called her to clarify the issue. Thank you very much

## 2021-06-11 NOTE — Progress Notes (Signed)
This patient reported continued cough and wheeze and so I ordered a cxr according to your last note.

## 2021-06-11 NOTE — Telephone Encounter (Signed)
See earlier note.

## 2021-06-11 NOTE — Progress Notes (Signed)
Patient reports that she continues to experience asthma symptoms including shortness of breath, and cough.  She reports air duo has improved her symptoms, however they are not resolved at this time.  She has been informed that her chest x-ray was negative.  We will order Trelegy 1 puff once a day to replace air duo at this time.  She will follow-up in 1 and half weeks in the clinic.  She verbalizes understanding that she will call the clinic with any worsening of symptoms.

## 2021-06-20 NOTE — Patient Instructions (Addendum)
Asthma Continue Trelegy 200-1 puff once a day to prevent cough or wheeze Continue albuterol 2 puffs once every 4 hours as needed for cough or wheeze You may use albuterol 2 puffs 5 to 15 minutes before activity to decrease cough or wheeze A prednisone taper has been provided in case of asthma flare while you are traveling out of the country. Begin only if needed for asthma flare, prednisone 10 mg tablets. Take 2 tablets once a day for 4 days, then take 1 tablet on the 5th day, then stop.  Please call the clinic if you need to begin the prednisone  Allergic rhinitis Continue allergen avoidance measures directed toward grass pollen, weed pollen, tree pollen, ragweed pollen, mold, dust mite, cat, dog, and horse as listed below Continue cetirizine 10 mg once a day as needed for runny nose Continue Flonase 2 sprays in each nostril once a day as needed for stuffy nose. In the right nostril, point the applicator out toward the right ear. In the left nostril, point the applicator out toward the left ear Consider saline nasal rinses as needed for nasal symptoms. Use this before any medicated nasal sprays for best result Consider a course of allergen immunotherapy if your allergy symptoms are not managed well with the treatment plan as listed above.  Oral allergy syndrome Continue to avoid the foods that bother your mouth such as pineapple  Reflux Continue dietary and lifestyle modifications as listed below Continue pantoprazole 40 mg once a day as previously prescribed  Call the clinic if this treatment plan is not working well for you.  Follow up in 3 months or sooner if needed.   Lifestyle Changes for Controlling GERD When you have GERD, stomach acid feels as if it's backing up toward your mouth. Whether or not you take medication to control your GERD, your symptoms can often be improved with lifestyle changes.   Raise Your Head Reflux is more likely to strike when you're lying down flat,  because stomach fluid can flow backward more easily. Raising the head of your bed 4-6 inches can help. To do this: Slide blocks or books under the legs at the head of your bed. Or, place a wedge under the mattress. Many foam stores can make a suitable wedge for you. The wedge should run from your waist to the top of your head. Don't just prop your head on several pillows. This increases pressure on your stomach. It can make GERD worse.  Watch Your Eating Habits Certain foods may increase the acid in your stomach or relax the lower esophageal sphincter, making GERD more likely. It's best to avoid the following: Coffee, tea, and carbonated drinks (with and without caffeine) Fatty, fried, or spicy food Mint, chocolate, onions, and tomatoes Any other foods that seem to irritate your stomach or cause you pain  Relieve the Pressure Eat smaller meals, even if you have to eat more often. Don't lie down right after you eat. Wait a few hours for your stomach to empty. Avoid tight belts and tight-fitting clothes. Lose excess weight.  Tobacco and Alcohol Avoid smoking tobacco and drinking alcohol. They can make GERD symptoms worse.  Reducing Pollen Exposure The American Academy of Allergy, Asthma and Immunology suggests the following steps to reduce your exposure to pollen during allergy seasons. Do not hang sheets or clothing out to dry; pollen may collect on these items. Do not mow lawns or spend time around freshly cut grass; mowing stirs up pollen. Keep windows closed  at night.  Keep car windows closed while driving. Minimize morning activities outdoors, a time when pollen counts are usually at their highest. Stay indoors as much as possible when pollen counts or humidity is high and on windy days when pollen tends to remain in the air longer. Use air conditioning when possible.  Many air conditioners have filters that trap the pollen spores. Use a HEPA room air filter to remove pollen form  the indoor air you breathe.  Control of Mold Allergen Mold and fungi can grow on a variety of surfaces provided certain temperature and moisture conditions exist.  Outdoor molds grow on plants, decaying vegetation and soil.  The major outdoor mold, Alternaria and Cladosporium, are found in very high numbers during hot and dry conditions.  Generally, a late Summer - Fall peak is seen for common outdoor fungal spores.  Rain will temporarily lower outdoor mold spore count, but counts rise rapidly when the rainy period ends.  The most important indoor molds are Aspergillus and Penicillium.  Dark, humid and poorly ventilated basements are ideal sites for mold growth.  The next most common sites of mold growth are the bathroom and the kitchen.  Outdoor [employer] Use air conditioning and keep windows closed Avoid exposure to decaying vegetation. Avoid leaf raking. Avoid grain handling. Consider wearing a face mask if working in moldy areas.  Indoor Mold Control Maintain humidity below 50%. Clean washable surfaces with 5% bleach solution. Remove sources e.g. Contaminated carpets.   Control of Dust Mite Allergen Dust mites play a major role in allergic asthma and rhinitis. They occur in environments with high humidity wherever human skin is found. Dust mites absorb humidity from the atmosphere (ie, they do not drink) and feed on organic matter (including shed human and animal skin). Dust mites are a microscopic type of insect that you cannot see with the naked eye. High levels of dust mites have been detected from mattresses, pillows, carpets, upholstered furniture, bed covers, clothes, soft toys and any woven material. The principal allergen of the dust mite is found in its feces. A gram of dust may contain 1,000 mites and 250,000 fecal particles. Mite antigen is easily measured in the air during house cleaning activities. Dust mites do not bite and do not cause harm to humans, other than by triggering  allergies/asthma.  Ways to decrease your exposure to dust mites in your home:  1. Encase mattresses, box springs and pillows with a mite-impermeable barrier or cover  2. Wash sheets, blankets and drapes weekly in hot water (130 F) with detergent and dry them in a dryer on the hot setting.  3. Have the room cleaned frequently with a vacuum cleaner and a damp dust-mop. For carpeting or rugs, vacuuming with a vacuum cleaner equipped with a high-efficiency particulate air (HEPA) filter. The dust mite allergic individual should not be in a room which is being cleaned and should wait 1 hour after cleaning before going into the room.  4. Do not sleep on upholstered furniture (eg, couches).  5. If possible removing carpeting, upholstered furniture and drapery from the home is ideal. Horizontal blinds should be eliminated in the rooms where the person spends the most time (bedroom, study, television room). Washable vinyl, roller-type shades are optimal.  6. Remove all non-washable stuffed toys from the bedroom. Wash stuffed toys weekly like sheets and blankets above.  7. Reduce indoor humidity to less than 50%. Inexpensive humidity monitors can be purchased at most hardware stores. Do  not use a humidifier as can make the problem worse and are not recommended.  Control of Dog or Cat Allergen Avoidance is the best way to manage a dog or cat allergy. If you have a dog or cat and are allergic to dog or cats, consider removing the dog or cat from the home. If you have a dog or cat but don't want to find it a new home, or if your family wants a pet even though someone in the household is allergic, here are some strategies that may help keep symptoms at bay:  Keep the pet out of your bedroom and restrict it to only a few rooms. Be advised that keeping the dog or cat in only one room will not limit the allergens to that room. Don't pet, hug or kiss the dog or cat; if you do, wash your hands with soap and  water. High-efficiency particulate air (HEPA) cleaners run continuously in a bedroom or living room can reduce allergen levels over time. Regular use of a high-efficiency vacuum cleaner or a central vacuum can reduce allergen levels. Giving your dog or cat a bath at least once a week can reduce airborne allergen.

## 2021-06-20 NOTE — Progress Notes (Signed)
[address] [place] [address] Dept: [phone]  FOLLOW UP NOTE  Patient ID: Ashley Acevedo, female    DOB: 1996-03-23  Age: 25 y.o. MRN: [id number] Date of Office Visit: 06/21/2021  Assessment  Chief Complaint: Asthma (Had covid in July - and the second inhaler has helped, but she would like to know what else she can do to improve her asthma before her wedding )  HPI Ashley Acevedo is a 26 year old female who presents to the clinic for follow-up visit.  She was last seen in this clinic on 05/24/2021 by Dr. Delorse Lek for evaluation of asthma, allergic rhinitis, oral allergy syndrome, and contact dermatitis.  She has a history of childhood asthma that has been well controlled since about age 81 and a more recent history of COVID in July which exacerbated her asthma.  At her last visit on May 24, 2021 she was prescribed AirDuo 232 which provided some relief, however, she continued to experience some symptoms of asthma.  She had a chest x-ray on 06/11/2020 which was negative for cardiopulmonary disease. At today's visit, she reports her asthma has been much more controlled with shortness of breath with vigorous activity, occasional wheezing which has mostly resolved, and intermittent cough mostly producing clear mucus and occasionally dry occurring in the daytime and nighttime.  She continues Trelegy 200-1 puff once a day and has not used her albuterol since her last visit to this clinic.  She did try montelukast in 2019 with perceived increase in wheeze which resolved when she stopped montelukast.  She is getting married and will be out of the country on her honeymoon in about 2 weeks time.  Allergic rhinitis is reported as moderately well controlled with symptoms including clear rhinorrhea occurring in the morning and occasional sneeze for which she takes cetirizine 10 mg once a day, Flonase daily, and uses saline nasal rinses on most days. Her last skin testing was on 11/02/2017 and was  positive to grass pollen, weed pollen, ragweed pollen, tree pollen, mold, dust mite, cat, dog, and horse.  Reflux is reported as well controlled with no symptoms including heartburn or vomiting.  She continues pantoprazole 40 mg once a day.  She continues to eat pineapple occasionally which causes mild tingling in her mouth. She does not experience cardiopulmonary or integumentary symptoms with ingestion of pineapple.  Her current medications are listed in the chart. Of note, we briefly discussed next steps if her asthma worsens including blood work for evaluation for biologic therapy as well as allergen immunotherapy when her breathing returns to baseline.  Drug Allergies:  Allergies  Allergen Reactions   Clindamycin Rash   Codeine Rash   Hydrocodone Rash    Physical Exam: BP 120/82   Pulse 93   Temp (!) 97.3 F (36.3 C)   Resp 18   Ht 5\' 8"  (1.727 m)   Wt (!) 362 lb 9.6 oz (164.5 kg)   LMP 06/01/2021 (Approximate)   SpO2 97%   BMI 55.13 kg/m    Physical Exam Vitals reviewed.  Constitutional:      Appearance: Normal appearance.  HENT:     Head: Normocephalic and atraumatic.     Right Ear: Tympanic membrane normal.     Left Ear: Tympanic membrane normal.     Nose:     Comments: Bilateral nares normal.  Pharynx normal.  Ears normal.  Eyes normal.    Mouth/Throat:     Pharynx: Oropharynx is clear.  Eyes:  Conjunctiva/sclera: Conjunctivae normal.  Cardiovascular:     Rate and Rhythm: Normal rate and regular rhythm.     Heart sounds: Normal heart sounds. No murmur heard. Pulmonary:     Effort: Pulmonary effort is normal.     Comments: Slight scattered rhonchi which cleared with cough Musculoskeletal:        General: Normal range of motion.     Cervical back: Normal range of motion and neck supple.  Skin:    General: Skin is warm and dry.  Neurological:     Mental Status: She is alert and oriented to person, place, and time.  Psychiatric:        Mood and Affect: Mood  normal.        Behavior: Behavior normal.        Thought Content: Thought content normal.        Judgment: Judgment normal.    Diagnostics: FVC 3.27, FEV1 3.04.  Predicted FVC 4.30, predicted FEV1 3.64.  Spirometry indicates normal ventilatory function.  Assessment and Plan: 1. Not well controlled moderate persistent asthma   2. Seasonal and perennial allergic rhinitis   3. Pollen-food allergy, subsequent encounter   4. Gastroesophageal reflux disease, unspecified whether esophagitis present      Patient Instructions  Asthma Continue Trelegy 200-1 puff once a day to prevent cough or wheeze Continue albuterol 2 puffs once every 4 hours as needed for cough or wheeze You may use albuterol 2 puffs 5 to 15 minutes before activity to decrease cough or wheeze A prednisone taper has been provided in case of asthma flare while you are traveling out of the country. Begin only if needed for asthma flare, prednisone 10 mg tablets. Take 2 tablets once a day for 4 days, then take 1 tablet on the 5th day, then stop.  Please call the clinic if you need to begin the prednisone  Allergic rhinitis Continue allergen avoidance measures directed toward grass pollen, weed pollen, tree pollen, ragweed pollen, mold, dust mite, cat, dog, and horse as listed below Continue cetirizine 10 mg once a day as needed for runny nose Continue Flonase 2 sprays in each nostril once a day as needed for stuffy nose. In the right nostril, point the applicator out toward the right ear. In the left nostril, point the applicator out toward the left ear Consider saline nasal rinses as needed for nasal symptoms. Use this before any medicated nasal sprays for best result Consider a course of allergen immunotherapy if your allergy symptoms are not managed well with the treatment plan as listed above.  Oral allergy syndrome Continue to avoid the foods that bother your mouth such as pineapple  Reflux Continue dietary and lifestyle  modifications as listed below Continue pantoprazole 40 mg once a day as previously prescribed  Call the clinic if this treatment plan is not working well for you.  Follow up in 3 months or sooner if needed.  Return in about 3 months (around 09/20/2021), or if symptoms worsen or fail to improve.    Thank you for the opportunity to care for this patient.  Please do not hesitate to contact me with questions.  Thermon Leyland, FNP [facility]

## 2021-06-21 ENCOUNTER — Encounter: Payer: Self-pay | Admitting: Family Medicine

## 2021-06-21 ENCOUNTER — Other Ambulatory Visit: Payer: Self-pay

## 2021-06-21 ENCOUNTER — Ambulatory Visit: Payer: 59 | Admitting: Family Medicine

## 2021-06-21 VITALS — BP 120/82 | HR 93 | Temp 97.3°F | Resp 18 | Ht 68.0 in | Wt 362.6 lb

## 2021-06-21 DIAGNOSIS — T781XXA Other adverse food reactions, not elsewhere classified, initial encounter: Secondary | ICD-10-CM | POA: Insufficient documentation

## 2021-06-21 DIAGNOSIS — J3089 Other allergic rhinitis: Secondary | ICD-10-CM

## 2021-06-21 DIAGNOSIS — K219 Gastro-esophageal reflux disease without esophagitis: Secondary | ICD-10-CM

## 2021-06-21 DIAGNOSIS — J4540 Moderate persistent asthma, uncomplicated: Secondary | ICD-10-CM | POA: Diagnosis not present

## 2021-06-21 DIAGNOSIS — J302 Other seasonal allergic rhinitis: Secondary | ICD-10-CM

## 2021-06-21 DIAGNOSIS — T781XXD Other adverse food reactions, not elsewhere classified, subsequent encounter: Secondary | ICD-10-CM

## 2021-06-21 HISTORY — DX: Other adverse food reactions, not elsewhere classified, initial encounter: T78.1XXA

## 2021-06-24 ENCOUNTER — Ambulatory Visit (INDEPENDENT_AMBULATORY_CARE_PROVIDER_SITE_OTHER): Payer: 59 | Admitting: Adult Health

## 2021-07-04 ENCOUNTER — Encounter: Payer: Self-pay | Admitting: Family Medicine

## 2021-07-04 ENCOUNTER — Other Ambulatory Visit: Payer: Self-pay

## 2021-07-04 MED ORDER — NYSTATIN 100000 UNIT/ML MT SUSP
5.0000 mL | Freq: Four times a day (QID) | OROMUCOSAL | 0 refills | Status: DC
Start: 2021-07-04 — End: 2021-07-23

## 2021-07-04 NOTE — Telephone Encounter (Signed)
Can you please call in nystatin suspension 100,000 units/mL. Use 5 mL every 6 hours for 7 days, then stop. Please call if symptoms worsen or with questions.

## 2021-07-05 ENCOUNTER — Other Ambulatory Visit: Payer: Self-pay | Admitting: Family Medicine

## 2021-07-05 ENCOUNTER — Telehealth: Payer: Self-pay

## 2021-07-05 MED ORDER — POLYMYXIN B-TRIMETHOPRIM 10000-0.1 UNIT/ML-% OP SOLN: 1.0000 [drp] | OPHTHALMIC | 0 refills | Status: AC

## 2021-07-05 NOTE — Telephone Encounter (Signed)
What did the drainage or the glue look like?

## 2021-07-05 NOTE — Telephone Encounter (Signed)
Patient called and expressed that she has sent a message to [facility] regarding her eyes being swollen shut and having drainage. [name] expressed that she is leaving today and going out of the country for her honeymoon and wanted to know what she should do. Please advise on what patient should do. Patient has sent pictures via mychart.

## 2021-07-05 NOTE — Telephone Encounter (Signed)
Ashley Acevedo should be clearing with the medications. Brush teeth and spit after inhaler use. Please see my note addressed to the [facility] about eye drops. Thank you

## 2021-07-05 NOTE — Telephone Encounter (Signed)
Can one of you please ask her if she wears contacts. I suggest continuing her allergy eye drop as directed. I am ordering an antibiotic eye drop for use if her symptoms do not improve. If her symptoms worsen or if she has a change in her eye sight or eye pain, she must seek emergency treatment. Please have her call with any questions. Thank you

## 2021-07-05 NOTE — Telephone Encounter (Signed)
Attached to ongoing MyChart message for the patient to view.

## 2021-07-23 ENCOUNTER — Encounter (INDEPENDENT_AMBULATORY_CARE_PROVIDER_SITE_OTHER): Payer: Self-pay | Admitting: Adult Health

## 2021-07-23 ENCOUNTER — Ambulatory Visit (INDEPENDENT_AMBULATORY_CARE_PROVIDER_SITE_OTHER): Payer: 59 | Admitting: Adult Health

## 2021-07-23 ENCOUNTER — Other Ambulatory Visit: Payer: Self-pay

## 2021-07-23 VITALS — BP 139/84 | HR 95 | Temp 98.6°F | Ht 68.0 in | Wt 361.0 lb

## 2021-07-23 DIAGNOSIS — E8881 Metabolic syndrome: Secondary | ICD-10-CM

## 2021-07-23 DIAGNOSIS — Z6841 Body Mass Index (BMI) 40.0 and over, adult: Secondary | ICD-10-CM | POA: Diagnosis not present

## 2021-07-23 DIAGNOSIS — Z9189 Other specified personal risk factors, not elsewhere classified: Secondary | ICD-10-CM

## 2021-07-23 MED ORDER — TIRZEPATIDE 2.5 MG/0.5ML ~~LOC~~ SOAJ: 2.5000 mg | SUBCUTANEOUS | 0 refills | Status: DC

## 2021-07-23 NOTE — Progress Notes (Signed)
Chief Complaint:   OBESITY Ashley Acevedo is here to discuss her progress with her obesity treatment plan along with follow-up of her obesity related diagnoses. Ashley Acevedo is on the Category 2 Plan and keeping a food journal and adhering to recommended goals of 1200 calories and 85 grams of protein and states she is following her eating plan approximately 0% of the time. Ashley Acevedo states she is not exercising regularly.  Today's visit was #: 27 Starting weight: 295 lbs Starting date: 08/24/2019 Today's weight: 361 lbs Today's date: 07/23/2021 Total lbs lost to date: 0 Total lbs lost since last in-office visit: 0  Interim History: Ashley Acevedo contracted thrush related to inhaler use to treat prolonged COVID cough.  Ashley Acevedo has resolved.  She has been off Victoza for months, unable to take daily.  She reports significant polyphagia.  Subjective:   1. Insulin resistance Family history of T2D - both grandmothers. She was unable to remember to take Victoza daily.  She denies family history of MTC or personal history of pancreatitis. She reports significant polyphagia since off GLP-1 therapy.  2. At risk for nausea Ashley Acevedo is at risk for nausea due to starting [facility] for IR.  Assessment/Plan:   1. Insulin resistance Remain off Victoza. Start Mounjaro 2.5 mg once weekly, as per below.  - Start tirzepatide [facility]) 2.5 MG/0.5ML Pen; Inject 2.5 mg into the skin once a week.  Dispense: 2 mL; Refill: 0  2. At risk for nausea Ashley Acevedo was given approximately 15 minutes of nausea prevention counseling today. Ashley Acevedo is at risk for nausea due to her new or current medication. She was encouraged to titrate her medication slowly, make sure to stay hydrated, eat smaller portions throughout the day, and avoid high fat meals.    3. Obesity, current BMI 54.9  Ashley Acevedo is currently in the action stage of change. As such, her goal is to continue with weight loss efforts. She has agreed to the Category 2 Plan.    Handout:  Category 2 Meal Plan.  Check fasting labs at next office visit.  Exercise goals:  As is.  Behavioral modification strategies: increasing lean protein intake, decreasing simple carbohydrates, meal planning and cooking strategies, keeping healthy foods in the home, and planning for success.  Ashley Acevedo has agreed to follow-up with our clinic in 3 weeks, fasting. She was informed of the importance of frequent follow-up visits to maximize her success with intensive lifestyle modifications for her multiple health conditions.   Objective:   Blood pressure 139/84, pulse 95, temperature 98.6 F (37 C), height 5\' 8"  (1.727 m), weight (!) 361 lb (163.7 kg), SpO2 97 %. Body mass index is 54.89 kg/m.  General: Cooperative, alert, well developed, in no acute distress. HEENT: Conjunctivae and lids unremarkable. Cardiovascular: Regular rhythm.  Lungs: Normal work of breathing. Neurologic: No focal deficits.   Lab Results  Component Value Date   CREATININE 0.69 12/20/2020   BUN 13 12/20/2020   NA 138 12/20/2020   K 4.5 12/20/2020   CL 101 12/20/2020   CO2 20 12/20/2020   Lab Results  Component Value Date   ALT 13 12/20/2020   AST 12 12/20/2020   ALKPHOS 94 12/20/2020   BILITOT 0.3 12/20/2020   Lab Results  Component Value Date   HGBA1C 5.2 12/20/2020   HGBA1C 5.3 07/25/2020   HGBA1C 5.1 04/11/2020   HGBA1C 4.9 08/24/2019   Lab Results  Component Value Date   INSULIN 19.7 12/20/2020   INSULIN 26.0 (H) 07/25/2020  INSULIN 26.0 (H) 04/11/2020   INSULIN 23.6 01/02/2020   INSULIN 27.6 (H) 08/24/2019   Lab Results  Component Value Date   CHOL 237 (H) 12/20/2020   HDL 86 12/20/2020   LDLCALC 125 (H) 12/20/2020   TRIG 154 (H) 12/20/2020   CHOLHDL 2.8 12/20/2020   Lab Results  Component Value Date   VD25OH 26.6 (L) 12/20/2020   VD25OH 38.9 07/25/2020   VD25OH 30.8 04/11/2020   Lab Results  Component Value Date   WBC 8.1 12/20/2020   HGB 12.8 12/20/2020   HCT  39.4 12/20/2020   MCV 85 12/20/2020   PLT 350 12/20/2020   Lab Results  Component Value Date   IRON 76 12/20/2020   TIBC 416 12/20/2020   FERRITIN 23 12/20/2020   Attestation Statements:   Reviewed by clinician on day of visit: allergies, medications, problem list, medical history, surgical history, family history, social history, and previous encounter notes.  I, Insurance claims handler, CMA, am acting as Energy manager for William Hamburger, NP.  I have reviewed the above documentation for accuracy and completeness, and I agree with the above. -  Tacuma Graffam d. Matti Minney, NP-C

## 2021-07-25 ENCOUNTER — Encounter (INDEPENDENT_AMBULATORY_CARE_PROVIDER_SITE_OTHER): Payer: Self-pay

## 2021-08-07 ENCOUNTER — Other Ambulatory Visit (INDEPENDENT_AMBULATORY_CARE_PROVIDER_SITE_OTHER): Payer: Self-pay | Admitting: Adult Health

## 2021-08-07 DIAGNOSIS — F3289 Other specified depressive episodes: Secondary | ICD-10-CM

## 2021-08-07 NOTE — Telephone Encounter (Signed)
Pt last seen by Katy Danford, FNP.  

## 2021-08-07 NOTE — Telephone Encounter (Signed)
LAST APPOINTMENT DATE: 07/23/21 NEXT APPOINTMENT DATE: 08/15/21   [id number] [place], [place] - [address] [facility] [address] [place] [address] Phone: [phone] Fax: [phone]  [id number] - [place], [place] - [address] [place] [address] Phone: [phone] Fax: [phone]  [facility] - [place], [place] - [address] [place] [address] Phone: [phone] Fax: [phone]  Patient is requesting a refill of the following medications: Pending Prescriptions:                       Disp   Refills   escitalopram (LEXAPRO) 10 MG tablet [Pharm*90 tab*0       Sig: TAKE 1 TABLET BY MOUTH EVERY DAY   Date last filled: 05/06/21 Previously prescribed by [facility]  Lab Results      Component                Value               Date                      HGBA1C                   5.2                 12/20/2020                HGBA1C                   5.3                 07/25/2020                HGBA1C                   5.1                 04/11/2020           Lab Results      Component                Value               Date                      LDLCALC                  125 (H)             12/20/2020                CREATININE               0.69                12/20/2020           Lab Results      Component                Value               Date                      VD25OH  26.6 (L)            12/20/2020                VD25OH                   38.9                07/25/2020                VD25OH                   30.8                04/11/2020            BP Readings from Last 3 Encounters: 07/23/21 : 139/84 06/21/21 : 120/82 05/24/21 : 136/86

## 2021-08-13 ENCOUNTER — Other Ambulatory Visit: Payer: Self-pay | Admitting: Physician Assistant

## 2021-08-14 ENCOUNTER — Other Ambulatory Visit: Payer: Self-pay | Admitting: Physician Assistant

## 2021-08-15 ENCOUNTER — Encounter (INDEPENDENT_AMBULATORY_CARE_PROVIDER_SITE_OTHER): Payer: Self-pay | Admitting: Adult Health

## 2021-08-15 ENCOUNTER — Ambulatory Visit (INDEPENDENT_AMBULATORY_CARE_PROVIDER_SITE_OTHER): Payer: 59 | Admitting: Adult Health

## 2021-08-15 ENCOUNTER — Other Ambulatory Visit: Payer: Self-pay

## 2021-08-15 ENCOUNTER — Encounter (INDEPENDENT_AMBULATORY_CARE_PROVIDER_SITE_OTHER): Payer: Self-pay

## 2021-08-15 VITALS — BP 134/86 | HR 100 | Temp 98.0°F | Ht 68.0 in | Wt 361.0 lb

## 2021-08-15 DIAGNOSIS — E8881 Metabolic syndrome: Secondary | ICD-10-CM

## 2021-08-15 DIAGNOSIS — E559 Vitamin D deficiency, unspecified: Secondary | ICD-10-CM | POA: Diagnosis not present

## 2021-08-15 DIAGNOSIS — Z9189 Other specified personal risk factors, not elsewhere classified: Secondary | ICD-10-CM

## 2021-08-15 DIAGNOSIS — E88819 Insulin resistance, unspecified: Secondary | ICD-10-CM

## 2021-08-15 DIAGNOSIS — E782 Mixed hyperlipidemia: Secondary | ICD-10-CM | POA: Diagnosis not present

## 2021-08-15 DIAGNOSIS — Z6841 Body Mass Index (BMI) 40.0 and over, adult: Secondary | ICD-10-CM

## 2021-08-15 MED ORDER — VITAMIN D (ERGOCALCIFEROL) 1.25 MG (50000 UNIT) PO CAPS: 50000.0000 [IU] | ORAL_CAPSULE | ORAL | 0 refills | Status: DC

## 2021-08-15 MED ORDER — TIRZEPATIDE 5 MG/0.5ML ~~LOC~~ SOAJ: 5.0000 mg | SUBCUTANEOUS | 0 refills | Status: DC

## 2021-08-15 NOTE — Progress Notes (Signed)
Chief Complaint:   OBESITY Ashley Acevedo is here to discuss her progress with her obesity treatment plan along with follow-up of her obesity related diagnoses. Ashley Acevedo is on the Category 2 Plan and states she is following her eating plan approximately 70% of the time. Ashley Acevedo states she is not exercising regularly.  Today's visit was #: 28 Starting weight: 295 lbs Starting date: 08/24/2019 Today's weight: 361 lbs Today's date: 08/15/2021 Total lbs lost to date: 0 Total lbs lost since last in-office visit: 0  Interim History:  Family history of T2D - both grandmothers. She was unable to remember to take Victoza daily.  She denies family history of MTC or personal history of pancreatitis. Started on Mounjaro 2.5mg  at last OV on 07/23/21.  Subjective:   1. Insulin resistance Family history of T2D - both grandmothers. She was unable to remember to take Victoza daily.  She denies family history of MTC or personal history of pancreatitis. Started on Mounjaro 2.5mg  at last OV on 07/23/21. She has had 4 doses at this strength- still experiencing breakthrough hunger. She denies mass in neck, dysphagia, dyspepsia, or persistent hoarseness.  2. Vitamin D deficiency On 12/20/2020, vitamin D level - 26.6 - well below goal of 50. She is currently taking prescription ergocalciferol 50,000 IU each week. She denies nausea, vomiting or muscle weakness.  3. Mixed hyperlipidemia She is not on statin therapy.  4. At risk for constipation [name] is at increased risk for constipation due to inadequate water intake, changes in diet, and/or use of medications such as GLP1 agonists. [name] denies hard, infrequent stools currently.    Assessment/Plan:   1. Insulin resistance Increase and refill Mounjaro to 5 mg once weekly, as per below. Check labs today.  - Increase and refill tirzepatide (MOUNJARO) 5 MG/0.5ML Pen; Inject 5 mg into the skin once a week.  Dispense: 6 mL; Refill: 0 - Comprehensive metabolic  panel - Hemoglobin A1c - Insulin, random  2. Vitamin D deficiency Refill ergocalciferol 50,000 IU once weekly. Check vitamin D level today.  - Refill Vitamin D, Ergocalciferol, (DRISDOL) 1.25 MG (50000 UNIT) CAPS capsule; Take 1 capsule (50,000 Units total) by mouth every 7 (seven) days.  Dispense: 4 capsule; Refill: 0 - VITAMIN D 25 Hydroxy (Vit-D Deficiency, Fractures)  3. Mixed hyperlipidemia Check labs today.  - Comprehensive metabolic panel - Lipid panel  4. At risk for constipation Jyasia was given approximately 15 minutes of counseling today regarding prevention of constipation. She was encouraged to increase water and fiber intake.    5. Obesity, current BMI 55.0  [name] is currently in the action stage of change. As such, her goal is to continue with weight loss efforts. She has agreed to the Category 2 Plan.   Exercise goals:  Walk the dog - [name].  Behavioral modification strategies: increasing lean protein intake, decreasing simple carbohydrates, meal planning and cooking strategies, keeping healthy foods in the home, and planning for success.  [name] has agreed to follow-up with our clinic in 3 weeks. She was informed of the importance of frequent follow-up visits to maximize her success with intensive lifestyle modifications for her multiple health conditions.   [name] was informed we would discuss her lab results at her next visit unless there is a critical issue that needs to be addressed sooner. [name] agreed to keep her next visit at the agreed upon time to discuss these results.  Objective:   Blood pressure 134/86, pulse 100, temperature 98 F (36.7 C), height  5\' 8"  (1.727 m), weight (!) 361 lb (163.7 kg), SpO2 96 %. Body mass index is 54.89 kg/m.  General: Cooperative, alert, well developed, in no acute distress. HEENT: Conjunctivae and lids unremarkable. Cardiovascular: Regular rhythm.  Lungs: Normal work of breathing. Neurologic: No focal deficits.   Lab  Results  Component Value Date   CREATININE 0.69 12/20/2020   BUN 13 12/20/2020   NA 138 12/20/2020   K 4.5 12/20/2020   CL 101 12/20/2020   CO2 20 12/20/2020   Lab Results  Component Value Date   ALT 13 12/20/2020   AST 12 12/20/2020   ALKPHOS 94 12/20/2020   BILITOT 0.3 12/20/2020   Lab Results  Component Value Date   HGBA1C 5.2 12/20/2020   HGBA1C 5.3 07/25/2020   HGBA1C 5.1 04/11/2020   HGBA1C 4.9 08/24/2019   Lab Results  Component Value Date   INSULIN 19.7 12/20/2020   INSULIN 26.0 (H) 07/25/2020   INSULIN 26.0 (H) 04/11/2020   INSULIN 23.6 01/02/2020   INSULIN 27.6 (H) 08/24/2019   Lab Results  Component Value Date   CHOL 237 (H) 12/20/2020   HDL 86 12/20/2020   LDLCALC 125 (H) 12/20/2020   TRIG 154 (H) 12/20/2020   CHOLHDL 2.8 12/20/2020   Lab Results  Component Value Date   VD25OH 26.6 (L) 12/20/2020   VD25OH 38.9 07/25/2020   VD25OH 30.8 04/11/2020   Lab Results  Component Value Date   WBC 8.1 12/20/2020   HGB 12.8 12/20/2020   HCT 39.4 12/20/2020   MCV 85 12/20/2020   PLT 350 12/20/2020   Lab Results  Component Value Date   IRON 76 12/20/2020   TIBC 416 12/20/2020   FERRITIN 23 12/20/2020   Attestation Statements:   Reviewed by clinician on day of visit: allergies, medications, problem list, medical history, surgical history, family history, social history, and previous encounter notes.  I, 02/19/2021, CMA, am acting as Insurance claims handler for Energy manager, NP.  I have reviewed the above documentation for accuracy and completeness, and I agree with the above. -  Irby Fails d. Elisavet Buehrer, NP-C

## 2021-08-16 ENCOUNTER — Encounter (INDEPENDENT_AMBULATORY_CARE_PROVIDER_SITE_OTHER): Payer: Self-pay | Admitting: Adult Health

## 2021-08-16 LAB — HEMOGLOBIN A1C
Est. average glucose Bld gHb Est-mCnc: 114 mg/dL
Hgb A1c MFr Bld: 5.6 % (ref 4.8–5.6)

## 2021-08-16 LAB — COMPREHENSIVE METABOLIC PANEL
ALT: 28 IU/L (ref 0–32)
AST: 19 IU/L (ref 0–40)
Albumin/Globulin Ratio: 1.5 (ref 1.2–2.2)
Albumin: 4.2 g/dL (ref 3.9–5.0)
Alkaline Phosphatase: 112 IU/L (ref 44–121)
BUN/Creatinine Ratio: 19 (ref 9–23)
BUN: 13 mg/dL (ref 6–20)
Bilirubin Total: <0.2 mg/dL (ref 0.0–1.2)
CO2: 23 mmol/L (ref 20–29)
Calcium: 9.8 mg/dL (ref 8.7–10.2)
Chloride: 102 mmol/L (ref 96–106)
Creatinine, Ser: 0.7 mg/dL (ref 0.57–1.00)
Globulin, Total: 2.8 g/dL (ref 1.5–4.5)
Glucose: 97 mg/dL (ref 70–99)
Potassium: 4.5 mmol/L (ref 3.5–5.2)
Sodium: 140 mmol/L (ref 134–144)
Total Protein: 7 g/dL (ref 6.0–8.5)
eGFR: 121 mL/min/{1.73_m2} (ref >59)

## 2021-08-16 LAB — LIPID PANEL
Chol/HDL Ratio: 4.1 ratio (ref 0.0–4.4)
Cholesterol, Total: 272 mg/dL — ABNORMAL HIGH (ref 100–199)
HDL: 66 mg/dL (ref >39)
LDL Chol Calc (NIH): 163 mg/dL — ABNORMAL HIGH (ref 0–99)
Triglycerides: 236 mg/dL — ABNORMAL HIGH (ref 0–149)
VLDL Cholesterol Cal: 43 mg/dL — ABNORMAL HIGH (ref 5–40)

## 2021-08-16 LAB — VITAMIN D 25 HYDROXY (VIT D DEFICIENCY, FRACTURES): Vit D, 25-Hydroxy: 24.9 ng/mL — ABNORMAL LOW (ref 30.0–100.0)

## 2021-08-16 LAB — INSULIN, RANDOM: INSULIN: 103 u[IU]/mL — ABNORMAL HIGH (ref 2.6–24.9)

## 2021-08-19 NOTE — Telephone Encounter (Signed)
Patient response

## 2021-08-19 NOTE — Telephone Encounter (Signed)
Please concerned about her random insulin. Please advise.

## 2021-09-09 ENCOUNTER — Ambulatory Visit (INDEPENDENT_AMBULATORY_CARE_PROVIDER_SITE_OTHER): Payer: 59 | Admitting: Adult Health

## 2021-09-16 ENCOUNTER — Other Ambulatory Visit (INDEPENDENT_AMBULATORY_CARE_PROVIDER_SITE_OTHER): Payer: Self-pay | Admitting: Adult Health

## 2021-09-16 DIAGNOSIS — E559 Vitamin D deficiency, unspecified: Secondary | ICD-10-CM

## 2021-09-19 NOTE — Patient Instructions (Addendum)
Asthma Continue Trelegy 200-1 puff once a day to prevent cough or wheeze. We can try to step down to a duel inhaler at your next visit. Continue albuterol 2 puffs once every 4 hours as needed for cough or wheeze You may use albuterol 2 puffs 5 to 15 minutes before activity to decrease cough or wheeze  Allergic rhinitis Continue allergen avoidance measures directed toward grass pollen, weed pollen, tree pollen, ragweed pollen, mold, dust mite, cat, dog, and horse as listed below Continue cetirizine 10 mg once a day as needed for runny nose Continue Flonase 2 sprays in each nostril once a day as needed for stuffy nose. In the right nostril, point the applicator out toward the right ear. In the left nostril, point the applicator out toward the left ear Consider saline nasal rinses as needed for nasal symptoms. Use this before any medicated nasal sprays for best result Return to the clinic when is convenient for you to update your environmental allergy testing.  Remember to stop antihistamines for 3 days before your testing appointment Consider a course of allergen immunotherapy if your allergy symptoms are not managed well with the treatment plan as listed above.  Oral allergy syndrome Continue to avoid the foods that bother your mouth such as pineapple  Reflux Continue dietary and lifestyle modifications as listed below Continue pantoprazole 40 mg once a day as previously prescribed  Your blood pressure was elevated at today's visit.  Follow-up with your primary care provider for management of your blood pressure  Call the clinic if this treatment plan is not working well for you.  Follow up in 6 months or sooner if needed.   Lifestyle Changes for Controlling GERD When you have GERD, stomach acid feels as if it's backing up toward your mouth. Whether or not you take medication to control your GERD, your symptoms can often be improved with lifestyle changes.   Raise Your Head Reflux is  more likely to strike when you're lying down flat, because stomach fluid can flow backward more easily. Raising the head of your bed 4-6 inches can help. To do this: Slide blocks or books under the legs at the head of your bed. Or, place a wedge under the mattress. Many foam stores can make a suitable wedge for you. The wedge should run from your waist to the top of your head. Don't just prop your head on several pillows. This increases pressure on your stomach. It can make GERD worse.  Watch Your Eating Habits Certain foods may increase the acid in your stomach or relax the lower esophageal sphincter, making GERD more likely. It's best to avoid the following: Coffee, tea, and carbonated drinks (with and without caffeine) Fatty, fried, or spicy food Mint, chocolate, onions, and tomatoes Any other foods that seem to irritate your stomach or cause you pain  Relieve the Pressure Eat smaller meals, even if you have to eat more often. Don't lie down right after you eat. Wait a few hours for your stomach to empty. Avoid tight belts and tight-fitting clothes. Lose excess weight.  Tobacco and Alcohol Avoid smoking tobacco and drinking alcohol. They can make GERD symptoms worse.  Reducing Pollen Exposure The American Academy of Allergy, Asthma and Immunology suggests the following steps to reduce your exposure to pollen during allergy seasons. Do not hang sheets or clothing out to dry; pollen may collect on these items. Do not mow lawns or spend time around freshly cut grass; mowing stirs up pollen. Keep  windows closed at night.  Keep car windows closed while driving. Minimize morning activities outdoors, a time when pollen counts are usually at their highest. Stay indoors as much as possible when pollen counts or humidity is high and on windy days when pollen tends to remain in the air longer. Use air conditioning when possible.  Many air conditioners have filters that trap the pollen  spores. Use a HEPA room air filter to remove pollen form the indoor air you breathe.  Control of Mold Allergen Mold and fungi can grow on a variety of surfaces provided certain temperature and moisture conditions exist.  Outdoor molds grow on plants, decaying vegetation and soil.  The major outdoor mold, Alternaria and Cladosporium, are found in very high numbers during hot and dry conditions.  Generally, a late Summer - Fall peak is seen for common outdoor fungal spores.  Rain will temporarily lower outdoor mold spore count, but counts rise rapidly when the rainy period ends.  The most important indoor molds are Aspergillus and Penicillium.  Dark, humid and poorly ventilated basements are ideal sites for mold growth.  The next most common sites of mold growth are the bathroom and the kitchen.  Outdoor [employer] Use air conditioning and keep windows closed Avoid exposure to decaying vegetation. Avoid leaf raking. Avoid grain handling. Consider wearing a face mask if working in moldy areas.  Indoor Mold Control Maintain humidity below 50%. Clean washable surfaces with 5% bleach solution. Remove sources e.g. Contaminated carpets.   Control of Dust Mite Allergen Dust mites play a major role in allergic asthma and rhinitis. They occur in environments with high humidity wherever human skin is found. Dust mites absorb humidity from the atmosphere (ie, they do not drink) and feed on organic matter (including shed human and animal skin). Dust mites are a microscopic type of insect that you cannot see with the naked eye. High levels of dust mites have been detected from mattresses, pillows, carpets, upholstered furniture, bed covers, clothes, soft toys and any woven material. The principal allergen of the dust mite is found in its feces. A gram of dust may contain 1,000 mites and 250,000 fecal particles. Mite antigen is easily measured in the air during house cleaning activities. Dust mites do not bite  and do not cause harm to humans, other than by triggering allergies/asthma.  Ways to decrease your exposure to dust mites in your home:  1. Encase mattresses, box springs and pillows with a mite-impermeable barrier or cover  2. Wash sheets, blankets and drapes weekly in hot water (130 F) with detergent and dry them in a dryer on the hot setting.  3. Have the room cleaned frequently with a vacuum cleaner and a damp dust-mop. For carpeting or rugs, vacuuming with a vacuum cleaner equipped with a high-efficiency particulate air (HEPA) filter. The dust mite allergic individual should not be in a room which is being cleaned and should wait 1 hour after cleaning before going into the room.  4. Do not sleep on upholstered furniture (eg, couches).  5. If possible removing carpeting, upholstered furniture and drapery from the home is ideal. Horizontal blinds should be eliminated in the rooms where the person spends the most time (bedroom, study, television room). Washable vinyl, roller-type shades are optimal.  6. Remove all non-washable stuffed toys from the bedroom. Wash stuffed toys weekly like sheets and blankets above.  7. Reduce indoor humidity to less than 50%. Inexpensive humidity monitors can be purchased at most hardware  stores. Do not use a humidifier as can make the problem worse and are not recommended.  Control of Dog or Cat Allergen Avoidance is the best way to manage a dog or cat allergy. If you have a dog or cat and are allergic to dog or cats, consider removing the dog or cat from the home. If you have a dog or cat but don't want to find it a new home, or if your family wants a pet even though someone in the household is allergic, here are some strategies that may help keep symptoms at bay:  Keep the pet out of your bedroom and restrict it to only a few rooms. Be advised that keeping the dog or cat in only one room will not limit the allergens to that room. Don't pet, hug or kiss the  dog or cat; if you do, wash your hands with soap and water. High-efficiency particulate air (HEPA) cleaners run continuously in a bedroom or living room can reduce allergen levels over time. Regular use of a high-efficiency vacuum cleaner or a central vacuum can reduce allergen levels. Giving your dog or cat a bath at least once a week can reduce airborne allergen.

## 2021-09-19 NOTE — Progress Notes (Signed)
[address] Deb[place]a Presto [place] [address] Dept: [phone]  FOLLOW UP NOTE  Patient ID: Ashley Acevedo, female    DOB: [birth year]  Age: 25 y.o. MRN: [id number] Date of Office Visit: 09/20/2021  Assessment  Chief Complaint: Allergic Rhinitis  (good) and Asthma (Weather change has affected it but other than that it has been ok/ACT 21)  HPI Ashley Acevedo is a 25 year old female who presents to the clinic for follow-up visit.  She was last seen in this clinic on 06/21/2021 by Thermon Leyland, FNP, for evaluation of asthma, allergic rhinitis, reflux, and oral allergy syndrome.  At today's visit, she reports her asthma has been moderately well controlled with some shortness of breath with activity and occasional dry cough occurring during the daytime.  She continues Trelegy 200-1 puff once a day and uses albuterol infrequently for rescue.  She does use albuterol before activity.  Allergic rhinitis is reported as moderately well controlled with occasional nasal symptoms and postnasal drainage.  She continues cetirizine 10 mg once a day, Flonase daily, and occasional saline nasal rinses.  Her last environmental allergy testing was on 11/02/2017 and was positive to grass pollen, weed pollen, tree pollen, ragweed pollen, mold, dust mite, cat, and dog and mold.  She is expressing an interest in retesting with the possibility to start allergy injections.  She continues to avoid foods that bother her mouth including pineapple.  Reflux is reported as well controlled with no symptoms including heartburn or vomiting.  She continues pantoprazole 40 mg once a day as previously prescribed.  Her current medications are listed in the chart.   Drug Allergies:  Allergies  Allergen Reactions   Clindamycin Rash   Codeine Rash   Hydrocodone Rash    Physical Exam: BP (!) 124/98   Pulse 100   Temp (!) 97.1 F (36.2 C) (Temporal)   Resp 20   Ht 5\' 8"  (1.727 m)   Wt (!) 363 lb 9.6 oz (164.9 kg)   SpO2 95%   BMI  55.29 kg/m    Physical Exam Vitals reviewed.  Constitutional:      Appearance: Normal appearance.  HENT:     Head: Normocephalic and atraumatic.     Right Ear: Tympanic membrane normal.     Left Ear: Tympanic membrane normal.     Nose:     Comments: Bilateral nares slightly erythematous with clear nasal drainage noted.  Pharynx normal.  Ears normal.  Eyes normal.    Mouth/Throat:     Pharynx: Oropharynx is clear.  Eyes:     Conjunctiva/sclera: Conjunctivae normal.  Cardiovascular:     Rate and Rhythm: Normal rate and regular rhythm.     Heart sounds: Normal heart sounds. No murmur heard. Pulmonary:     Effort: Pulmonary effort is normal.     Breath sounds: Normal breath sounds.     Comments: Lungs clear to auscultation Musculoskeletal:        General: Normal range of motion.     Cervical back: Normal range of motion and neck supple.  Skin:    General: Skin is warm and dry.  Neurological:     Mental Status: She is alert and oriented to person, place, and time.  Psychiatric:        Mood and Affect: Mood normal.        Behavior: Behavior normal.        Thought Content: Thought content normal.        Judgment: Judgment normal.  Diagnostics: FVC 3.64, FEV1 3.03.  Predicted FVC 4.37, predicted FEV1 3.69.  Spirometry indicates normal ventilatory function.  Assessment and Plan: 1. Moderate persistent asthma without complication   2. Seasonal and perennial allergic rhinitis   3. Pollen-food allergy, subsequent encounter   4. Gastroesophageal reflux disease, unspecified whether esophagitis present     Meds ordered this encounter  Medications   albuterol (VENTOLIN HFA) 108 (90 Base) MCG/ACT inhaler    Sig: Inhale 2 puffs into the lungs every 4 (four) hours as needed for wheezing or shortness of breath.    Dispense:  18 g    Refill:  1   Fluticasone-Umeclidin-Vilant (TRELEGY ELLIPTA) 200-62.5-25 MCG/ACT AEPB    Sig: Inhale 1 puff into the lungs daily.    Dispense:  28  each    Refill:  5     Patient Instructions  Asthma Continue Trelegy 200-1 puff once a day to prevent cough or wheeze. We can try to step down to a duel inhaler at your next visit. Continue albuterol 2 puffs once every 4 hours as needed for cough or wheeze You may use albuterol 2 puffs 5 to 15 minutes before activity to decrease cough or wheeze  Allergic rhinitis Continue allergen avoidance measures directed toward grass pollen, weed pollen, tree pollen, ragweed pollen, mold, dust mite, cat, dog, and horse as listed below Continue cetirizine 10 mg once a day as needed for runny nose Continue Flonase 2 sprays in each nostril once a day as needed for stuffy nose. In the right nostril, point the applicator out toward the right ear. In the left nostril, point the applicator out toward the left ear Consider saline nasal rinses as needed for nasal symptoms. Use this before any medicated nasal sprays for best result Return to the clinic when is convenient for you to update your environmental allergy testing.  Remember to stop antihistamines for 3 days before your testing appointment Consider a course of allergen immunotherapy if your allergy symptoms are not managed well with the treatment plan as listed above.  Oral allergy syndrome Continue to avoid the foods that bother your mouth such as pineapple  Reflux Continue dietary and lifestyle modifications as listed below Continue pantoprazole 40 mg once a day as previously prescribed  Your blood pressure was elevated at today's visit.  Follow-up with your primary care provider for management of your blood pressure  Call the clinic if this treatment plan is not working well for you.  Follow up in 6 months or sooner if needed.   Return in about 6 months (around 03/21/2022), or if symptoms worsen or fail to improve.    Thank you for the opportunity to care for this patient.  Please do not hesitate to contact me with questions.  Thermon Leyland,  FNP [facility]

## 2021-09-20 ENCOUNTER — Other Ambulatory Visit: Payer: Self-pay

## 2021-09-20 ENCOUNTER — Ambulatory Visit: Payer: 59 | Admitting: Family Medicine

## 2021-09-20 ENCOUNTER — Encounter: Payer: Self-pay | Admitting: Family Medicine

## 2021-09-20 VITALS — BP 124/98 | HR 100 | Temp 97.1°F | Resp 20 | Ht 68.0 in | Wt 363.6 lb

## 2021-09-20 DIAGNOSIS — J302 Other seasonal allergic rhinitis: Secondary | ICD-10-CM | POA: Diagnosis not present

## 2021-09-20 DIAGNOSIS — J4540 Moderate persistent asthma, uncomplicated: Secondary | ICD-10-CM | POA: Diagnosis not present

## 2021-09-20 DIAGNOSIS — J3089 Other allergic rhinitis: Secondary | ICD-10-CM | POA: Diagnosis not present

## 2021-09-20 DIAGNOSIS — T781XXD Other adverse food reactions, not elsewhere classified, subsequent encounter: Secondary | ICD-10-CM

## 2021-09-20 DIAGNOSIS — T7819XD Other adverse food reactions, not elsewhere classified, subsequent encounter: Secondary | ICD-10-CM

## 2021-09-20 DIAGNOSIS — K219 Gastro-esophageal reflux disease without esophagitis: Secondary | ICD-10-CM | POA: Diagnosis not present

## 2021-09-20 MED ORDER — ALBUTEROL SULFATE HFA 108 (90 BASE) MCG/ACT IN AERS: 2.0000 | INHALATION_SPRAY | RESPIRATORY_TRACT | 1 refills | Status: AC | PRN

## 2021-09-20 MED ORDER — TRELEGY ELLIPTA 200-62.5-25 MCG/ACT IN AEPB: 1.0000 | INHALATION_SPRAY | Freq: Every day | RESPIRATORY_TRACT | 5 refills | Status: DC

## 2021-09-20 NOTE — Addendum Note (Signed)
Addended by: Rolland Bimler D on: 09/20/2021 04:48 PM   Modules accepted: Orders

## 2021-09-23 ENCOUNTER — Other Ambulatory Visit: Payer: Self-pay | Admitting: Family Medicine

## 2021-09-23 ENCOUNTER — Other Ambulatory Visit: Payer: Self-pay | Admitting: *Deleted

## 2021-09-30 ENCOUNTER — Ambulatory Visit (INDEPENDENT_AMBULATORY_CARE_PROVIDER_SITE_OTHER): Payer: 59 | Admitting: Adult Health

## 2021-09-30 ENCOUNTER — Encounter (INDEPENDENT_AMBULATORY_CARE_PROVIDER_SITE_OTHER): Payer: Self-pay | Admitting: Adult Health

## 2021-09-30 ENCOUNTER — Other Ambulatory Visit: Payer: Self-pay

## 2021-09-30 VITALS — BP 136/78 | HR 108 | Temp 98.2°F | Ht 68.0 in | Wt 358.0 lb

## 2021-09-30 DIAGNOSIS — Z6841 Body Mass Index (BMI) 40.0 and over, adult: Secondary | ICD-10-CM

## 2021-09-30 DIAGNOSIS — J4540 Moderate persistent asthma, uncomplicated: Secondary | ICD-10-CM | POA: Diagnosis not present

## 2021-09-30 DIAGNOSIS — E8881 Metabolic syndrome: Secondary | ICD-10-CM | POA: Diagnosis not present

## 2021-09-30 NOTE — Progress Notes (Signed)
Chief Complaint:   OBESITY Ashley Acevedo is here to discuss her progress with her obesity treatment plan along with follow-up of her obesity related diagnoses. Ashley Acevedo is on the Category 2 Plan and states she is following her eating plan approximately 60% of the time. Ashley Acevedo states she is not exercising regularly.  Today's visit was #: 29 Starting weight: 295 lbs Starting date: 08/24/2019 Today's weight: 358 lbs Today's date: 09/30/2021 Total lbs lost to date: 0 Total lbs lost since last in-office visit: 3 lbs  Interim History:  Ashley Acevedo is on Mounjaro 5 mg - 5 injections at this dose. Nausea has resolved.   She injects Sunday evening - hunger level will increase by late Thursday each week.  Subjective:   1. Insulin resistance Ashley Acevedo is on Mounjaro 5 mg - 5 injections at this dose. Nausea has resolved. She injects Sunday evening - hunger level will increase by late Thursday each week. She denies mass in neck, dysphagia, dyspepsia, persistent hoarseness, or GI upset.  2. Moderate persistent asthma without complication 09/20/21 Allergy appt notes:  asthma has been moderately well controlled with some shortness of breath with activity and occasional dry cough occurring during the daytime.  She continues Trelegy 200-1 puff once a day and uses albuterol infrequently for rescue.  She does use albuterol before activity.  Allergic rhinitis is reported as moderately well controlled with occasional nasal symptoms and postnasal drainage.  She continues cetirizine 10 mg once a day, Flonase daily, and occasional saline nasal rinses.  Her last environmental allergy testing was on 11/02/2017 and was positive to grass pollen, weed pollen, tree pollen, ragweed pollen, mold, dust mite, cat, and dog and mold.  She is expressing an interest in retesting with the possibility to start allergy injections.  She continues to avoid foods that bother her mouth including pineapple. Reflux is reported as well controlled with  no symptoms including heartburn or vomiting.  She continues pantoprazole 40 mg once a day  10/03/21-Return to the clinic when is convenient for you to update your environmental allergy testing.  Remember to stop antihistamines for 3 days before your testing appointment  Assessment/Plan:   1. Insulin resistance Continue Mounjaro 5 mg, just refilled prescription.  2. Moderate persistent asthma without complication Follow-up with allergy specialist.  Continue all mediations as directed by Allergist, remain well hydrated with water. Continue to avoid tobacco/vape.  3. Obesity, current BMI 54.6  Charnice is currently in the action stage of change. As such, her goal is to continue with weight loss efforts. She has agreed to the Category 2 Plan.   Exercise goals:  NEAT activities.  Behavioral modification strategies: increasing lean protein intake, decreasing simple carbohydrates, meal planning and cooking strategies, keeping healthy foods in the home, planning for success, and keeping a strict food journal.  Ashley Acevedo has agreed to follow-up with our clinic in 4 weeks. She was informed of the importance of frequent follow-up visits to maximize her success with intensive lifestyle modifications for her multiple health conditions.   Objective:   Blood pressure 136/78, pulse (!) 108, temperature 98.2 F (36.8 C), height 5\' 8"  (1.727 m), weight (!) 358 lb (162.4 kg), SpO2 97 %. Body mass index is 54.43 kg/m.  General: Cooperative, alert, well developed, in no acute distress. HEENT: Conjunctivae and lids unremarkable. Cardiovascular: Regular rhythm.  Lungs: Normal work of breathing. Neurologic: No focal deficits.   Lab Results  Component Value Date   CREATININE 0.70 08/15/2021   BUN 13 08/15/2021  NA 140 08/15/2021   K 4.5 08/15/2021   CL 102 08/15/2021   CO2 23 08/15/2021   Lab Results  Component Value Date   ALT 28 08/15/2021   AST 19 08/15/2021   ALKPHOS 112 08/15/2021   BILITOT  <0.2 08/15/2021   Lab Results  Component Value Date   HGBA1C 5.6 08/15/2021   HGBA1C 5.2 12/20/2020   HGBA1C 5.3 07/25/2020   HGBA1C 5.1 04/11/2020   HGBA1C 4.9 08/24/2019   Lab Results  Component Value Date   INSULIN 103.0 (H) 08/15/2021   INSULIN 19.7 12/20/2020   INSULIN 26.0 (H) 07/25/2020   INSULIN 26.0 (H) 04/11/2020   INSULIN 23.6 01/02/2020   Lab Results  Component Value Date   CHOL 272 (H) 08/15/2021   HDL 66 08/15/2021   LDLCALC 163 (H) 08/15/2021   TRIG 236 (H) 08/15/2021   CHOLHDL 4.1 08/15/2021   Lab Results  Component Value Date   VD25OH 24.9 (L) 08/15/2021   VD25OH 26.6 (L) 12/20/2020   VD25OH 38.9 07/25/2020   Lab Results  Component Value Date   WBC 8.1 12/20/2020   HGB 12.8 12/20/2020   HCT 39.4 12/20/2020   MCV 85 12/20/2020   PLT 350 12/20/2020   Lab Results  Component Value Date   IRON 76 12/20/2020   TIBC 416 12/20/2020   FERRITIN 23 12/20/2020   Attestation Statements:   Reviewed by clinician on day of visit: allergies, medications, problem list, medical history, surgical history, family history, social history, and previous encounter notes.  Time spent on visit including pre-visit chart review and post-visit care and charting was 28 minutes.   I, Insurance claims handler, CMA, am acting as Energy manager for William Hamburger, NP.  I have reviewed the above documentation for accuracy and completeness, and I agree with the above. -  Jonette Wassel d. Elner Seifert, NP-C

## 2021-10-01 ENCOUNTER — Encounter (INDEPENDENT_AMBULATORY_CARE_PROVIDER_SITE_OTHER): Payer: Self-pay

## 2021-10-01 ENCOUNTER — Telehealth (INDEPENDENT_AMBULATORY_CARE_PROVIDER_SITE_OTHER): Payer: Self-pay | Admitting: Adult Health

## 2021-10-01 NOTE — Telephone Encounter (Signed)
Prior authorization denied for [facility]. Patient already uses coupon savings card, patient sent mychart message.

## 2021-10-03 ENCOUNTER — Encounter: Payer: Self-pay | Admitting: Family Medicine

## 2021-10-03 ENCOUNTER — Ambulatory Visit: Payer: 59 | Admitting: Family Medicine

## 2021-10-03 ENCOUNTER — Other Ambulatory Visit: Payer: Self-pay

## 2021-10-03 VITALS — BP 128/80 | HR 101 | Temp 97.5°F | Resp 18 | Ht 68.0 in | Wt 364.0 lb

## 2021-10-03 DIAGNOSIS — K219 Gastro-esophageal reflux disease without esophagitis: Secondary | ICD-10-CM | POA: Diagnosis not present

## 2021-10-03 DIAGNOSIS — J4540 Moderate persistent asthma, uncomplicated: Secondary | ICD-10-CM | POA: Diagnosis not present

## 2021-10-03 DIAGNOSIS — J3089 Other allergic rhinitis: Secondary | ICD-10-CM

## 2021-10-03 DIAGNOSIS — J302 Other seasonal allergic rhinitis: Secondary | ICD-10-CM

## 2021-10-03 DIAGNOSIS — T781XXD Other adverse food reactions, not elsewhere classified, subsequent encounter: Secondary | ICD-10-CM

## 2021-10-03 NOTE — Progress Notes (Signed)
[address] [place] [address] Dept: [phone]  FOLLOW UP NOTE  Patient ID: Ashley Acevedo, female    DOB: 07/17/1996  Age: 25 y.o. MRN: [id number] Date of Office Visit: 10/03/2021  Assessment  Chief Complaint: Asthma (No flares )  HPI Ashley Acevedo is a 25 year old female who presents the clinic for follow-up visit.  She was last seen in this clinic on 09/20/2021 for evaluation of asthma, allergic rhinitis, reflux, and oral allergy syndrome.  Her last environmental allergy testing was on 11/02/2017 and was positive to grass pollen, weed pollen, tree pollen, ragweed pollen, mold, dust mite, cat, and dog.  At today's visit, she reports her asthma has been well controlled with occasional shortness of breath with vigorous activity.  She denies shortness of breath, cough, and wheeze moderate activity and rest.  She continues Trelegy 200-1 puff once a day and has not used her albuterol since her last visit to this clinic.  Allergic rhinitis is reported as moderately well controlled with symptoms including clear rhinorrhea, sneezing, and postnasal drainage occurring over the last several days while off antihistamine for environmental allergy skin testing.  She continues cetirizine 10 mg once a day and Flonase daily, and occasional saline nasal rinses.  Reflux is reported as well controlled with pantoprazole.  She does report that she experienced mouth itching recently after eating strawberries.  She denies cardiopulmonary, integumentary, or other gastrointestinal symptoms with this strawberry ingestion.  Her current medications are listed in the chart.   Drug Allergies:  Allergies  Allergen Reactions   Clindamycin Rash   Codeine Rash   Hydrocodone Rash    Physical Exam: BP 128/80    Pulse (!) 101    Temp (!) 97.5 F (36.4 C)    Resp 18    Ht 5\' 8"  (1.727 m)    Wt (!) 364 lb (165.1 kg)    SpO2 97%    BMI 55.35 kg/m    Physical Exam Vitals reviewed.  Constitutional:       Appearance: Normal appearance.  HENT:     Head: Normocephalic and atraumatic.     Right Ear: Tympanic membrane normal.     Left Ear: Tympanic membrane normal.     Nose:     Comments: Bilateral nares slightly erythematous with clear nasal drainage noted.  Pharynx normal.  Ears normal.  Eyes normal.    Mouth/Throat:     Pharynx: Oropharynx is clear.  Eyes:     Conjunctiva/sclera: Conjunctivae normal.  Cardiovascular:     Rate and Rhythm: Normal rate and regular rhythm.     Heart sounds: Normal heart sounds. No murmur heard. Pulmonary:     Effort: Pulmonary effort is normal.     Breath sounds: Normal breath sounds.     Comments: Lungs clear to auscultation Musculoskeletal:        General: Normal range of motion.     Cervical back: Normal range of motion and neck supple.  Skin:    General: Skin is warm and dry.  Neurological:     Mental Status: She is alert and oriented to person, place, and time.  Psychiatric:        Mood and Affect: Mood normal.        Thought Content: Thought content normal.        Judgment: Judgment normal.    Diagnostics: FVC 3.54, FEV1 2.93.  Predicted FVC 4.37, predicted FEV1 3.69.  Spirometry indicates normal ventilatory function.  Assessment and Plan:  1. Moderate persistent asthma without complication   2. Seasonal and perennial allergic rhinitis   3. Pollen-food allergy, subsequent encounter   4. Gastroesophageal reflux disease, unspecified whether esophagitis present     Patient Instructions  Asthma Begin AirDuo 232-1 puff twice a day to control asthma. This will replace Trelegy Continue albuterol 2 puffs once every 4 hours as needed for cough or wheeze You may use albuterol 2 puffs 5 to 15 minutes before activity to decrease cough or wheeze  Allergic rhinitis Your skin testing was positive to grass pollen, weed pollen, ragweed pollen, tree pollen, molds, dog, cat hair, and dust mite.  Allergen avoidance measures are listed below Continue  cetirizine 10 mg once a day as needed for runny nose. Remember to rotate to a different antihistamine about every 3 months. Some examples of over the counter antihistamines include Zyrtec (cetirizine), Xyzal (levocetirizine), Allegra (fexofenadine), and Claritin (loratidine).  Continue Flonase 2 sprays in each nostril once a day as needed for stuffy nose. In the right nostril, point the applicator out toward the right ear. In the left nostril, point the applicator out toward the left ear Consider saline nasal rinses as needed for nasal symptoms. Use this before any medicated nasal sprays for best result Consider a course of allergen immunotherapy if your allergy symptoms are not managed well with the treatment plan as listed above. If your skin continues to be irritated where the allergy testing was placed in, take 2 prednisone 10 mg tablets 1 time.  Oral allergy syndrome Continue to avoid the foods that bother your mouth such as pineapple and strawberry  Possible food allergy Testing to strawberry was negative at today's visit.  This is likely a symptom of oral allergy syndrome.  Reflux Continue dietary and lifestyle modifications as listed below Continue pantoprazole 40 mg once a day as previously prescribed  Your blood pressure was elevated at today's visit.  Follow-up with your primary care provider for management of your blood pressure  Call the clinic if this treatment plan is not working well for you.  Follow up in 2 months or sooner if needed.   Return in about 2 months (around 12/04/2021), or if symptoms worsen or fail to improve.    Thank you for the opportunity to care for this patient.  Please do not hesitate to contact me with questions.  Thermon Leyland, FNP [facility]

## 2021-10-03 NOTE — Patient Instructions (Addendum)
Asthma Begin AirDuo 232-1 puff twice a day to control asthma. This will replace Trelegy Continue albuterol 2 puffs once every 4 hours as needed for cough or wheeze You may use albuterol 2 puffs 5 to 15 minutes before activity to decrease cough or wheeze  Allergic rhinitis Your skin testing was positive to grass pollen, weed pollen, ragweed pollen, tree pollen, molds, dog, cat hair, and dust mite.  Allergen avoidance measures are listed below Continue cetirizine 10 mg once a day as needed for runny nose. Remember to rotate to a different antihistamine about every 3 months. Some examples of over the counter antihistamines include Zyrtec (cetirizine), Xyzal (levocetirizine), Allegra (fexofenadine), and Claritin (loratidine).  Continue Flonase 2 sprays in each nostril once a day as needed for stuffy nose. In the right nostril, point the applicator out toward the right ear. In the left nostril, point the applicator out toward the left ear Consider saline nasal rinses as needed for nasal symptoms. Use this before any medicated nasal sprays for best result Consider a course of allergen immunotherapy if your allergy symptoms are not managed well with the treatment plan as listed above. If your skin continues to be irritated where the allergy testing was placed in, take 2 prednisone 10 mg tablets 1 time.  Oral allergy syndrome Continue to avoid the foods that bother your mouth such as pineapple and strawberry  Possible food allergy Testing to strawberry was negative at today's visit.  This is likely a symptom of oral allergy syndrome.  Reflux Continue dietary and lifestyle modifications as listed below Continue pantoprazole 40 mg once a day as previously prescribed  Your blood pressure was elevated at today's visit.  Follow-up with your primary care provider for management of your blood pressure  Call the clinic if this treatment plan is not working well for you.  Follow up in 2 months or sooner if  needed.   Lifestyle Changes for Controlling GERD When you have GERD, stomach acid feels as if its backing up toward your mouth. Whether or not you take medication to control your GERD, your symptoms can often be improved with lifestyle changes.   Raise Your Head Reflux is more likely to strike when youre lying down flat, because stomach fluid can flow backward more easily. Raising the head of your bed 4-6 inches can help. To do this: Slide blocks or books under the legs at the head of your bed. Or, place a wedge under the mattress. Many foam stores can make a suitable wedge for you. The wedge should run from your waist to the top of your head. Dont just prop your head on several pillows. This increases pressure on your stomach. It can make GERD worse.  Watch Your Eating Habits Certain foods may increase the acid in your stomach or relax the lower esophageal sphincter, making GERD more likely. Its best to avoid the following: Coffee, tea, and carbonated drinks (with and without caffeine) Fatty, fried, or spicy food Mint, chocolate, onions, and tomatoes Any other foods that seem to irritate your stomach or cause you pain  Relieve the Pressure Eat smaller meals, even if you have to eat more often. Dont lie down right after you eat. Wait a few hours for your stomach to empty. Avoid tight belts and tight-fitting clothes. Lose excess weight.  Tobacco and Alcohol Avoid smoking tobacco and drinking alcohol. They can make GERD symptoms worse.  Reducing Pollen Exposure The American Academy of Allergy, Asthma and Immunology suggests the following steps  to reduce your exposure to pollen during allergy seasons. Do not hang sheets or clothing out to dry; pollen may collect on these items. Do not mow lawns or spend time around freshly cut grass; mowing stirs up pollen. Keep windows closed at night.  Keep car windows closed while driving. Minimize morning activities outdoors, a time when  pollen counts are usually at their highest. Stay indoors as much as possible when pollen counts or humidity is high and on windy days when pollen tends to remain in the air longer. Use air conditioning when possible.  Many air conditioners have filters that trap the pollen spores. Use a HEPA room air filter to remove pollen form the indoor air you breathe.  Control of Mold Allergen Mold and fungi can grow on a variety of surfaces provided certain temperature and moisture conditions exist.  Outdoor molds grow on plants, decaying vegetation and soil.  The major outdoor mold, Alternaria and Cladosporium, are found in very high numbers during hot and dry conditions.  Generally, a late Summer - Fall peak is seen for common outdoor fungal spores.  Rain will temporarily lower outdoor mold spore count, but counts rise rapidly when the rainy period ends.  The most important indoor molds are Aspergillus and Penicillium.  Dark, humid and poorly ventilated basements are ideal sites for mold growth.  The next most common sites of mold growth are the bathroom and the kitchen.  Outdoor [employer] Use air conditioning and keep windows closed Avoid exposure to decaying vegetation. Avoid leaf raking. Avoid grain handling. Consider wearing a face mask if working in moldy areas.  Indoor Mold Control Maintain humidity below 50%. Clean washable surfaces with 5% bleach solution. Remove sources e.g. Contaminated carpets.   Control of Dust Mite Allergen Dust mites play a major role in allergic asthma and rhinitis. They occur in environments with high humidity wherever human skin is found. Dust mites absorb humidity from the atmosphere (ie, they do not drink) and feed on organic matter (including shed human and animal skin). Dust mites are a microscopic type of insect that you cannot see with the naked eye. High levels of dust mites have been detected from mattresses, pillows, carpets, upholstered furniture, bed  covers, clothes, soft toys and any woven material. The principal allergen of the dust mite is found in its feces. A gram of dust may contain 1,000 mites and 250,000 fecal particles. Mite antigen is easily measured in the air during house cleaning activities. Dust mites do not bite and do not cause harm to humans, other than by triggering allergies/asthma.  Ways to decrease your exposure to dust mites in your home:  1. Encase mattresses, box springs and pillows with a mite-impermeable barrier or cover  2. Wash sheets, blankets and drapes weekly in hot water (130 F) with detergent and dry them in a dryer on the hot setting.  3. Have the room cleaned frequently with a vacuum cleaner and a damp dust-mop. For carpeting or rugs, vacuuming with a vacuum cleaner equipped with a high-efficiency particulate air (HEPA) filter. The dust mite allergic individual should not be in a room which is being cleaned and should wait 1 hour after cleaning before going into the room.  4. Do not sleep on upholstered furniture (eg, couches).  5. If possible removing carpeting, upholstered furniture and drapery from the home is ideal. Horizontal blinds should be eliminated in the rooms where the person spends the most time (bedroom, study, television room). Washable vinyl, roller-type  shades are optimal.  6. Remove all non-washable stuffed toys from the bedroom. Wash stuffed toys weekly like sheets and blankets above.  7. Reduce indoor humidity to less than 50%. Inexpensive humidity monitors can be purchased at most hardware stores. Do not use a humidifier as can make the problem worse and are not recommended.  Control of Dog or Cat Allergen Avoidance is the best way to manage a dog or cat allergy. If you have a dog or cat and are allergic to dog or cats, consider removing the dog or cat from the home. If you have a dog or cat but dont want to find it a new home, or if your family wants a pet even though someone in the  household is allergic, here are some strategies that may help keep symptoms at bay:  Keep the pet out of your bedroom and restrict it to only a few rooms. Be advised that keeping the dog or cat in only one room will not limit the allergens to that room. Dont pet, hug or kiss the dog or cat; if you do, wash your hands with soap and water. High-efficiency particulate air (HEPA) cleaners run continuously in a bedroom or living room can reduce allergen levels over time. Regular use of a high-efficiency vacuum cleaner or a central vacuum can reduce allergen levels. Giving your dog or cat a bath at least once a week can reduce airborne allergen.  The oral allergy syndrome (OAS) or pollen-food allergy syndrome (PFAS) is a relatively common form of food allergy, particularly in adults. It typically occurs in people who have pollen allergies when the immune system "sees" proteins on the food that look like proteins on the pollen. This results in the allergy antibody (IgE) binding to the food instead of the pollen. Patients typically report itching and/or mild swelling of the mouth and throat immediately following ingestion of certain uncooked fruits (including nuts) or raw vegetables. Only a very small number of affected individuals experience systemic allergic reactions, such as anaphylaxis which occurs with true food allergies.

## 2021-10-03 NOTE — Addendum Note (Signed)
Addended by: Orson Aloe on: 10/03/2021 12:21 PM   Modules accepted: Orders

## 2021-10-22 ENCOUNTER — Encounter (INDEPENDENT_AMBULATORY_CARE_PROVIDER_SITE_OTHER): Payer: Self-pay | Admitting: Adult Health

## 2021-10-22 ENCOUNTER — Other Ambulatory Visit: Payer: Self-pay | Admitting: Physician Assistant

## 2021-10-23 ENCOUNTER — Encounter (INDEPENDENT_AMBULATORY_CARE_PROVIDER_SITE_OTHER): Payer: Self-pay

## 2021-10-23 ENCOUNTER — Other Ambulatory Visit (INDEPENDENT_AMBULATORY_CARE_PROVIDER_SITE_OTHER): Payer: Self-pay | Admitting: Adult Health

## 2021-10-23 ENCOUNTER — Encounter (INDEPENDENT_AMBULATORY_CARE_PROVIDER_SITE_OTHER): Payer: Self-pay | Admitting: Adult Health

## 2021-10-23 MED ORDER — TIRZEPATIDE 7.5 MG/0.5ML ~~LOC~~ SOAJ: 7.5000 mg | SUBCUTANEOUS | 0 refills | Status: DC

## 2021-10-23 NOTE — Telephone Encounter (Signed)
LAST APPOINTMENT DATE: 09/30/21 NEXT APPOINTMENT DATE: 10/31/20   [id number] [place], [place] - [address] AT [facility] [address] [place] [address] Phone: [phone] Fax: [phone]  [id number] - [place], [place] - [address] [place] [address] Phone: [phone] Fax: [phone]  [facility] - [place], [place] - [address] [place] [address] Phone: [phone] Fax: [phone]  Patient is requesting a refill of the following medications: No prescriptions requested or ordered in this encounter   Date last filled: 09/30/21 Previously prescribed by [facility]  Lab Results      Component                Value               Date                      HGBA1C                   5.6                 08/15/2021                HGBA1C                   5.2                 12/20/2020                HGBA1C                   5.3                 07/25/2020           Lab Results      Component                Value               Date                      LDLCALC                  163 (H)             08/15/2021                CREATININE               0.70                08/15/2021           Lab Results      Component                Value               Date                      VD25OH                   24.9 (L)            08/15/2021                VD25OH  26.6 (L)            12/20/2020                VD25OH                   38.9                07/25/2020            BP Readings from Last 3 Encounters: 10/03/21 : 128/80 09/30/21 : 136/78 09/20/21 : (!) 124/98

## 2021-10-24 ENCOUNTER — Other Ambulatory Visit: Payer: Self-pay | Admitting: Physician Assistant

## 2021-10-31 ENCOUNTER — Other Ambulatory Visit: Payer: Self-pay

## 2021-10-31 ENCOUNTER — Ambulatory Visit (INDEPENDENT_AMBULATORY_CARE_PROVIDER_SITE_OTHER): Payer: 59 | Admitting: Adult Health

## 2021-10-31 ENCOUNTER — Encounter (INDEPENDENT_AMBULATORY_CARE_PROVIDER_SITE_OTHER): Payer: Self-pay | Admitting: Adult Health

## 2021-10-31 VITALS — BP 115/74 | HR 95 | Temp 97.9°F | Ht 68.0 in | Wt 366.0 lb

## 2021-10-31 DIAGNOSIS — Z6841 Body Mass Index (BMI) 40.0 and over, adult: Secondary | ICD-10-CM | POA: Diagnosis not present

## 2021-10-31 DIAGNOSIS — E8881 Metabolic syndrome: Secondary | ICD-10-CM | POA: Diagnosis not present

## 2021-11-04 NOTE — Progress Notes (Signed)
Chief Complaint:   OBESITY Ashley Acevedo is here to discuss her progress with her obesity treatment plan along with follow-up of her obesity related diagnoses. Ashley Acevedo is on the Category 2 Plan and states she is following her eating plan approximately 50% of the time. Ashley Acevedo states she is walking for 30 minutes 3-4 times per week.  Today's visit was #: 30 Starting weight: 295 lbs Starting date: 08/24/2019 Today's weight: 366 lbs Today's date: 10/31/2021 Total lbs lost to date: 0 Total lbs lost since last in-office visit: 0  Interim History:  Ashley Acevedo's 2023 Health/Wellness Goals: 1) Healthy preparedness to become mother-not planning on pregnancy in the next year.  Has been on Mounjaro 5mg  since 08/15/21- tolerating well.  Subjective:   1. Insulin resistance Victoza replaced by 13/3/22 on 07/23/2021. Titrated up to 5 mg on 08/15/21- will increase to 7.5 mg with next refill. She denies mass in neck, dysphagia, dyspepsia, persistent hoarseness, or GI upset. Aware to use barrier method contraception for 4 weeks after dose increase.  Assessment/Plan:   1. Insulin resistance Continue Mounjaro, increase to 7.5 mg when new prescription is ready. Aware to use barrier method contraception for 4 weeks after dose increase.  2. Obesity, current BMI 55.6  Ashley Acevedo is currently in the action stage of change. As such, her goal is to continue with weight loss efforts. She has agreed to the Category 2 Plan.   Exercise goals:  As is.  Behavioral modification strategies: increasing lean protein intake, decreasing simple carbohydrates, meal planning and cooking strategies, keeping healthy foods in the home, and planning for success.  Ashley Acevedo has agreed to follow-up with our clinic in 4 weeks. She was informed of the importance of frequent follow-up visits to maximize her success with intensive lifestyle modifications for her multiple health conditions.   Objective:   Blood pressure 115/74, pulse 95,  temperature 97.9 F (36.6 C), temperature source Oral, height 5\' 8"  (1.727 m), weight (!) 366 lb (166 kg), last menstrual period 10/24/2021, SpO2 97 %. Body mass index is 55.65 kg/m.  General: Cooperative, alert, well developed, in no acute distress. HEENT: Conjunctivae and lids unremarkable. Cardiovascular: Regular rhythm.  Lungs: Normal work of breathing. Neurologic: No focal deficits.   Lab Results  Component Value Date   CREATININE 0.70 08/15/2021   BUN 13 08/15/2021   NA 140 08/15/2021   K 4.5 08/15/2021   CL 102 08/15/2021   CO2 23 08/15/2021   Lab Results  Component Value Date   ALT 28 08/15/2021   AST 19 08/15/2021   ALKPHOS 112 08/15/2021   BILITOT <0.2 08/15/2021   Lab Results  Component Value Date   HGBA1C 5.6 08/15/2021   HGBA1C 5.2 12/20/2020   HGBA1C 5.3 07/25/2020   HGBA1C 5.1 04/11/2020   HGBA1C 4.9 08/24/2019   Lab Results  Component Value Date   INSULIN 103.0 (H) 08/15/2021   INSULIN 19.7 12/20/2020   INSULIN 26.0 (H) 07/25/2020   INSULIN 26.0 (H) 04/11/2020   INSULIN 23.6 01/02/2020   Lab Results  Component Value Date   CHOL 272 (H) 08/15/2021   HDL 66 08/15/2021   LDLCALC 163 (H) 08/15/2021   TRIG 236 (H) 08/15/2021   CHOLHDL 4.1 08/15/2021   Lab Results  Component Value Date   VD25OH 24.9 (L) 08/15/2021   VD25OH 26.6 (L) 12/20/2020   VD25OH 38.9 07/25/2020   Lab Results  Component Value Date   WBC 8.1 12/20/2020   HGB 12.8 12/20/2020   HCT 39.4 12/20/2020  MCV 85 12/20/2020   PLT 350 12/20/2020   Lab Results  Component Value Date   IRON 76 12/20/2020   TIBC 416 12/20/2020   FERRITIN 23 12/20/2020   Attestation Statements:   Reviewed by clinician on day of visit: allergies, medications, problem list, medical history, surgical history, family history, social history, and previous encounter notes.  I, Insurance claims handler, CMA, am acting as Energy manager for William Hamburger, NP.  I have reviewed the above documentation for  accuracy and completeness, and I agree with the above. -  Katina Remick d. Ashley Summer, NP-C

## 2021-11-07 ENCOUNTER — Other Ambulatory Visit (INDEPENDENT_AMBULATORY_CARE_PROVIDER_SITE_OTHER): Payer: Self-pay | Admitting: Adult Health

## 2021-11-07 ENCOUNTER — Telehealth (INDEPENDENT_AMBULATORY_CARE_PROVIDER_SITE_OTHER): Payer: Self-pay

## 2021-11-07 MED ORDER — SEMAGLUTIDE-WEIGHT MANAGEMENT 1 MG/0.5ML ~~LOC~~ SOAJ: 1.0000 mg | SUBCUTANEOUS | 0 refills | Status: DC

## 2021-11-07 NOTE — Telephone Encounter (Signed)
See my chart message

## 2021-11-07 NOTE — Telephone Encounter (Signed)
No the insurance company processed the authorization and denied it as she does not meet the criteria for approval. If it was a name issue it would have come back member not found.

## 2021-11-11 ENCOUNTER — Other Ambulatory Visit (INDEPENDENT_AMBULATORY_CARE_PROVIDER_SITE_OTHER): Payer: Self-pay

## 2021-11-11 DIAGNOSIS — E8881 Metabolic syndrome: Secondary | ICD-10-CM

## 2021-11-11 MED ORDER — TIRZEPATIDE 7.5 MG/0.5ML ~~LOC~~ SOAJ: 7.5000 mg | SUBCUTANEOUS | 0 refills | Status: DC

## 2021-11-11 NOTE — Progress Notes (Signed)
Pt requested to remain on Mounjaro 7.5mg  stated she spoke to the pharmacy and they are filling the prescription. Per William Hamburger NP request, wegovy was discontinued and Mounjaro 7.5mg  injecj 7.5mg  into the skin once a week. Disp 8ml RX0 reordered.  Ashley Cottam LPN

## 2021-11-12 NOTE — Telephone Encounter (Signed)
Mounjaro denied.

## 2021-11-14 ENCOUNTER — Encounter (INDEPENDENT_AMBULATORY_CARE_PROVIDER_SITE_OTHER): Payer: Self-pay

## 2021-11-14 ENCOUNTER — Other Ambulatory Visit (INDEPENDENT_AMBULATORY_CARE_PROVIDER_SITE_OTHER): Payer: Self-pay | Admitting: Adult Health

## 2021-11-14 MED ORDER — LIRAGLUTIDE 18 MG/3ML ~~LOC~~ SOPN: 1.8000 mg | PEN_INJECTOR | Freq: Every day | SUBCUTANEOUS | 0 refills | Status: DC

## 2021-11-14 NOTE — Telephone Encounter (Signed)
Please advise 

## 2021-11-15 ENCOUNTER — Other Ambulatory Visit (INDEPENDENT_AMBULATORY_CARE_PROVIDER_SITE_OTHER): Payer: Self-pay | Admitting: Adult Health

## 2021-11-15 DIAGNOSIS — F3289 Other specified depressive episodes: Secondary | ICD-10-CM

## 2021-11-19 NOTE — Telephone Encounter (Signed)
LAST APPOINTMENT DATE: 10/31/21 NEXT APPOINTMENT DATE: 11/27/21   [facility] #[id number] [place], [place] - [address] AT [facility] [address] [place] [address] Phone: [phone] Fax: [phone]  [id number] - [place], [place] - [address] [place] [address] Phone: [phone] Fax: [phone]  [facility] - [place], [place] - [address] [place] [address] Phone: [phone] Fax: [phone]  Patient is requesting a refill of the following medications: Pending Prescriptions:                       Disp   Refills   escitalopram (LEXAPRO) 10 MG tablet [Pharm*90 tab*0       Sig: TAKE 1 TABLET BY MOUTH EVERY DAY   Date last filled: 08/07/21 Previously prescribed by [facility]  Lab Results      Component                Value               Date                      HGBA1C                   5.6                 08/15/2021                HGBA1C                   5.2                 12/20/2020                HGBA1C                   5.3                 07/25/2020           Lab Results      Component                Value               Date                      LDLCALC                  163 (H)             08/15/2021                CREATININE               0.70                08/15/2021           Lab Results      Component                Value               Date                      VD25OH  24.9 (L)            08/15/2021                VD25OH                   26.6 (L)            12/20/2020                VD25OH                   38.9                07/25/2020            BP Readings from Last 3 Encounters: 10/31/21 : 115/74 10/03/21 : 128/80 09/30/21 : 136/78

## 2021-11-20 ENCOUNTER — Other Ambulatory Visit: Payer: Self-pay | Admitting: Physician Assistant

## 2021-11-21 ENCOUNTER — Other Ambulatory Visit: Payer: Self-pay | Admitting: Physician Assistant

## 2021-11-21 ENCOUNTER — Encounter: Payer: Self-pay | Admitting: Physician Assistant

## 2021-11-21 MED ORDER — PANTOPRAZOLE SODIUM 40 MG PO TBEC: DELAYED_RELEASE_TABLET | ORAL | 0 refills | Status: DC

## 2021-11-27 ENCOUNTER — Encounter (INDEPENDENT_AMBULATORY_CARE_PROVIDER_SITE_OTHER): Payer: Self-pay | Admitting: Adult Health

## 2021-11-27 ENCOUNTER — Telehealth (INDEPENDENT_AMBULATORY_CARE_PROVIDER_SITE_OTHER): Payer: 59 | Admitting: Adult Health

## 2021-11-27 ENCOUNTER — Other Ambulatory Visit: Payer: Self-pay

## 2021-11-27 DIAGNOSIS — E669 Obesity, unspecified: Secondary | ICD-10-CM

## 2021-11-27 DIAGNOSIS — R110 Nausea: Secondary | ICD-10-CM | POA: Diagnosis not present

## 2021-11-27 DIAGNOSIS — K219 Gastro-esophageal reflux disease without esophagitis: Secondary | ICD-10-CM

## 2021-11-27 DIAGNOSIS — E8881 Metabolic syndrome: Secondary | ICD-10-CM

## 2021-11-27 DIAGNOSIS — Z6841 Body Mass Index (BMI) 40.0 and over, adult: Secondary | ICD-10-CM

## 2021-11-27 HISTORY — DX: Nausea: R11.0

## 2021-11-27 MED ORDER — ONDANSETRON 8 MG PO TBDP: 8.0000 mg | ORAL_TABLET | Freq: Three times a day (TID) | ORAL | 0 refills | Status: DC | PRN

## 2021-11-27 MED ORDER — PANTOPRAZOLE SODIUM 40 MG PO TBEC: DELAYED_RELEASE_TABLET | ORAL | 0 refills | Status: DC

## 2021-11-27 NOTE — Progress Notes (Signed)
TeleHealth Visit:  Due to the COVID-19 pandemic, this visit was completed with telemedicine (audio/video) technology to reduce patient and provider exposure as well as to preserve personal protective equipment.   Ashley Acevedo has verbally consented to this TeleHealth visit. The patient is located at home, the provider is located at the [facility] office. The participants in this visit include the listed provider and patient. The visit was conducted today via MyChart video.  Chief Complaint: OBESITY Ashley Acevedo is here to discuss her progress with her obesity treatment plan along with follow-up of her obesity related diagnoses. Ashley Acevedo is on the Category 2 Plan and states she is following her eating plan approximately 80% of the time. Ashley Acevedo states she is walking for 30 minutes 3-4 times per week.  Today's visit was #: 23 Starting weight: 295 lbs Starting date: 08/24/2019  Interim History:  Ashley Acevedo's insurance will not cover any GLP-1 or GIP-GLP-1 therapy.  Last dose of Mounjaro 5 mg last week of January 2023.  Of note - previously on Victoza and Mounjaro.  Tolerated both well.  Subjective:   1. Insulin resistance Ashley Acevedo's insurance will not cover any GLP-1 or GIP-GLP-1 therapy.  Last dose of Mounjaro 5 mg last week of January 2023. Of note - previously on Victoza and Mounjaro.  Tolerated both well.  2. Nausea in adult N/V/Diarrhea, fever/chills - began 11/26/2021 at 2345 pm. She denies cardiac disorders.  EKG on 08/24/2019 - SR.  3. Gastroesophageal reflux disease, unspecified whether esophagitis present Well controlled with pantoprazole 40 mg daily. Requesting bridge refill until she establishes with new PCP.  Assessment/Plan:   1. Insulin resistance Consider metformin therapy at next office visit.  2. Nausea in adult Ondansetron 8 mg Q8H PRN. Bland diet sent via MyChart.  - Start ondansetron (ZOFRAN-ODT) 8 MG disintegrating tablet; Take 1 tablet (8 mg total) by mouth  every 8 (eight) hours as needed for nausea or vomiting.  Dispense: 20 tablet; Refill: 0  3. Gastroesophageal reflux disease, unspecified whether esophagitis present Refill pantoprazole 40 mg daily.  - Refill pantoprazole (PROTONIX) 40 MG tablet; TAKE 1 TABLET(40 MG) BY MOUTH DAILY  Dispense: 90 tablet; Refill: 0  4. Obesity, current BMI 55.6  Ashley Acevedo is currently in the action stage of change. As such, her goal is to continue with weight loss efforts. She has agreed to the Category 2 Plan.   Exercise goals:  As is.  Behavioral modification strategies: increasing water intake, better snacking choices, and planning for success.  Ashley Acevedo has agreed to follow-up with our clinic in 3 weeks. She was informed of the importance of frequent follow-up visits to maximize her success with intensive lifestyle modifications for her multiple health conditions.  Objective:   VITALS: Per patient if applicable, see vitals. GENERAL: Alert and in no acute distress. CARDIOPULMONARY: No increased WOB. Speaking in clear sentences.  PSYCH: Pleasant and cooperative. Speech normal rate and rhythm. Affect is appropriate. Insight and judgement are appropriate. Attention is focused, linear, and appropriate.  NEURO: Oriented as arrived to appointment on time with no prompting.   Lab Results  Component Value Date   CREATININE 0.70 08/15/2021   BUN 13 08/15/2021   NA 140 08/15/2021   K 4.5 08/15/2021   CL 102 08/15/2021   CO2 23 08/15/2021   Lab Results  Component Value Date   ALT 28 08/15/2021   AST 19 08/15/2021   ALKPHOS 112 08/15/2021   BILITOT <0.2 08/15/2021   Lab Results  Component Value Date  HGBA1C 5.6 08/15/2021   HGBA1C 5.2 12/20/2020   HGBA1C 5.3 07/25/2020   HGBA1C 5.1 04/11/2020   HGBA1C 4.9 08/24/2019   Lab Results  Component Value Date   INSULIN 103.0 (H) 08/15/2021   INSULIN 19.7 12/20/2020   INSULIN 26.0 (H) 07/25/2020   INSULIN 26.0 (H) 04/11/2020   INSULIN 23.6 01/02/2020    Lab Results  Component Value Date   CHOL 272 (H) 08/15/2021   HDL 66 08/15/2021   LDLCALC 163 (H) 08/15/2021   TRIG 236 (H) 08/15/2021   CHOLHDL 4.1 08/15/2021   Lab Results  Component Value Date   VD25OH 24.9 (L) 08/15/2021   VD25OH 26.6 (L) 12/20/2020   VD25OH 38.9 07/25/2020   Lab Results  Component Value Date   WBC 8.1 12/20/2020   HGB 12.8 12/20/2020   HCT 39.4 12/20/2020   MCV 85 12/20/2020   PLT 350 12/20/2020   Lab Results  Component Value Date   IRON 76 12/20/2020   TIBC 416 12/20/2020   FERRITIN 23 12/20/2020   Attestation Statements:   Reviewed by clinician on day of visit: allergies, medications, problem list, medical history, surgical history, family history, social history, and previous encounter notes.  Time spent on visit including pre-visit chart review and post-visit charting and care was 25 minutes.   I, Water quality scientist, CMA, am acting as Location manager for Mina Marble, NP.  I have reviewed the above documentation for accuracy and completeness, and I agree with the above. - Afrah Burlison d. Gabreal Worton, NP-C

## 2021-11-28 ENCOUNTER — Encounter (INDEPENDENT_AMBULATORY_CARE_PROVIDER_SITE_OTHER): Payer: Self-pay

## 2021-11-28 ENCOUNTER — Other Ambulatory Visit (INDEPENDENT_AMBULATORY_CARE_PROVIDER_SITE_OTHER): Payer: Self-pay

## 2021-11-28 DIAGNOSIS — K219 Gastro-esophageal reflux disease without esophagitis: Secondary | ICD-10-CM

## 2021-11-28 DIAGNOSIS — R110 Nausea: Secondary | ICD-10-CM

## 2021-11-28 MED ORDER — ONDANSETRON 8 MG PO TBDP: 8.0000 mg | ORAL_TABLET | Freq: Three times a day (TID) | ORAL | 0 refills | Status: DC | PRN

## 2021-11-28 MED ORDER — PANTOPRAZOLE SODIUM 40 MG PO TBEC: DELAYED_RELEASE_TABLET | ORAL | 0 refills | Status: DC

## 2021-11-28 NOTE — Telephone Encounter (Signed)
[facility] [address] I attempted to reach out to [facility] unable to get through to pharmacy.

## 2021-12-11 ENCOUNTER — Encounter (INDEPENDENT_AMBULATORY_CARE_PROVIDER_SITE_OTHER): Payer: Self-pay | Admitting: Adult Health

## 2021-12-17 LAB — HM PAP SMEAR

## 2021-12-25 ENCOUNTER — Other Ambulatory Visit: Payer: Self-pay

## 2021-12-25 ENCOUNTER — Telehealth (INDEPENDENT_AMBULATORY_CARE_PROVIDER_SITE_OTHER): Payer: Self-pay | Admitting: Adult Health

## 2021-12-25 ENCOUNTER — Encounter (INDEPENDENT_AMBULATORY_CARE_PROVIDER_SITE_OTHER): Payer: Self-pay | Admitting: Adult Health

## 2021-12-25 ENCOUNTER — Ambulatory Visit (INDEPENDENT_AMBULATORY_CARE_PROVIDER_SITE_OTHER): Payer: 59 | Admitting: Adult Health

## 2021-12-25 VITALS — BP 117/79 | HR 94 | Temp 97.9°F | Ht 68.0 in | Wt 362.0 lb

## 2021-12-25 DIAGNOSIS — E559 Vitamin D deficiency, unspecified: Secondary | ICD-10-CM

## 2021-12-25 DIAGNOSIS — E669 Obesity, unspecified: Secondary | ICD-10-CM | POA: Diagnosis not present

## 2021-12-25 DIAGNOSIS — E8881 Metabolic syndrome: Secondary | ICD-10-CM | POA: Diagnosis not present

## 2021-12-25 DIAGNOSIS — Z9189 Other specified personal risk factors, not elsewhere classified: Secondary | ICD-10-CM

## 2021-12-25 DIAGNOSIS — Z6841 Body Mass Index (BMI) 40.0 and over, adult: Secondary | ICD-10-CM | POA: Diagnosis not present

## 2021-12-25 MED ORDER — SEMAGLUTIDE-WEIGHT MANAGEMENT 0.25 MG/0.5ML ~~LOC~~ SOAJ: 0.2500 mg | SUBCUTANEOUS | 0 refills | Status: DC

## 2021-12-25 MED ORDER — VITAMIN D (ERGOCALCIFEROL) 1.25 MG (50000 UNIT) PO CAPS: 50000.0000 [IU] | ORAL_CAPSULE | ORAL | 0 refills | Status: DC

## 2021-12-25 NOTE — Telephone Encounter (Signed)
Prior authorization previously denied for [facility] on 11/20/21. Per insurance: plan exclusion ?

## 2021-12-25 NOTE — Progress Notes (Signed)
? ? ? ?Chief Complaint:  ? ?OBESITY ?Ashley Acevedo is here to discuss her progress with her obesity treatment plan along with follow-up of her obesity related diagnoses. Ashley Acevedo is on the Category 2 Plan and states she is following her eating plan approximately 50% of the time. Ashley Acevedo states she is walking for 45 minutes 2 times per week. ? ?Today's visit was #: 32 ?Starting weight: 295 lbs ?Starting date: 08/24/2019 ?Today's weight: 362 lbs ?Today's date: 12/25/2021 ?Total lbs lost to date: 0 ?Total lbs lost since last in-office visit: 4 lbs ? ?Interim History:  ?Previous GLP-1, GIP/GLP-1 therapy responses: ?Per patient, Victoza was not effective, caused significant bruising at injection sites. ?Per patient, Mounjaro resulted in significant decrease in cravings/appetite. ?She denies family hx of MTC or personal hx of pancreatitis. ? ?Of note:  On oral birth control- Drospirenon-Ethinyl Estradiolo 3-0.02 mg QD ? ?Subjective:  ? ?1. Vitamin D deficiency ?On 08/15/2021, vitamin D level - 24.9 - well below goal of 50-70. ?She is currently taking prescription ergocalciferol 50,000 IU each week. She denies nausea, vomiting or muscle weakness. ? ?2. Insulin resistance ?On 08/15/2021, insulin level - 103 - significantly elevated. ?Previous GLP-1, GIP/GLP-1 therapy responses: ?Per patient, Victoza was not effective, caused significant bruising at injection sites. ?Per patient, Mounjaro resulted in significant decrease in cravings/appetite. ?She denies family hx of MTC or personal hx of pancreatitis. ? ?3. At risk for constipation ?Ashley Acevedo is at increased risk for constipation due to GLP-1 therapy for obesity. ? ?Assessment/Plan:  ? ?1. Vitamin D deficiency ?Check labs at next office visit. ?Refill ergocalciferol 50,000 IU once weekly. ? ?- Refill Vitamin D, Ergocalciferol, (DRISDOL) 1.25 MG (50000 UNIT) CAPS capsule; Take 1 capsule (50,000 Units total) by mouth every 7 (seven) days.  Dispense: 4 capsule; Refill: 0 ? ?2. Insulin  resistance ?Check labs at next office visit. ?Start GLP-1 therapy. ? ?3. At risk for constipation ?Ashley Acevedo was given approximately 15 minutes of counseling today regarding prevention of constipation. She was encouraged to increase water and fiber intake.  ? ?4. Obesity, current BMI 55.0 ? ?Ashley Acevedo is currently in the action stage of change. As such, her goal is to continue with weight loss efforts. She has agreed to the Category 2 Plan.  ? ?Start Wegovy 0.25 mg once weekly. ? ?- Start Semaglutide-Weight Management 0.25 MG/0.5ML SOAJ; Inject 0.25 mg into the skin once a week.  Dispense: 2 mL; Refill: 0 ? ?Check fasting labs at next office visit. ? ?Exercise goals:  As is. ? ?Behavioral modification strategies: increasing lean protein intake, decreasing simple carbohydrates, keeping healthy foods in the home, ways to avoid boredom eating, and planning for success. ? ?Ashley Acevedo has agreed to follow-up with our clinic in 4-5 weeks, fasting. She was informed of the importance of frequent follow-up visits to maximize her success with intensive lifestyle modifications for her multiple health conditions.  ? ?Objective:  ? ?Blood pressure 117/79, pulse 94, temperature 97.9 ?F (36.6 ?C), height 5\' 8"  (1.727 m), weight (!) 362 lb (164.2 kg), SpO2 98 %. ?Body mass index is 55.04 kg/m?. ? ?General: Cooperative, alert, well developed, in no acute distress. ?HEENT: Conjunctivae and lids unremarkable. ?Cardiovascular: Regular rhythm.  ?Lungs: Normal work of breathing. ?Neurologic: No focal deficits.  ? ?Lab Results  ?Component Value Date  ? CREATININE 0.70 08/15/2021  ? BUN 13 08/15/2021  ? NA 140 08/15/2021  ? K 4.5 08/15/2021  ? CL 102 08/15/2021  ? CO2 23 08/15/2021  ? ?Lab Results  ?Component Value  Date  ? ALT 28 08/15/2021  ? AST 19 08/15/2021  ? ALKPHOS 112 08/15/2021  ? BILITOT <0.2 08/15/2021  ? ?Lab Results  ?Component Value Date  ? HGBA1C 5.6 08/15/2021  ? HGBA1C 5.2 12/20/2020  ? HGBA1C 5.3 07/25/2020  ? HGBA1C 5.1 04/11/2020  ?  HGBA1C 4.9 08/24/2019  ? ?Lab Results  ?Component Value Date  ? INSULIN 103.0 (H) 08/15/2021  ? INSULIN 19.7 12/20/2020  ? INSULIN 26.0 (H) 07/25/2020  ? INSULIN 26.0 (H) 04/11/2020  ? INSULIN 23.6 01/02/2020  ? ?Lab Results  ?Component Value Date  ? CHOL 272 (H) 08/15/2021  ? HDL 66 08/15/2021  ? LDLCALC 163 (H) 08/15/2021  ? TRIG 236 (H) 08/15/2021  ? CHOLHDL 4.1 08/15/2021  ? ?Lab Results  ?Component Value Date  ? VD25OH 24.9 (L) 08/15/2021  ? VD25OH 26.6 (L) 12/20/2020  ? VD25OH 38.9 07/25/2020  ? ?Lab Results  ?Component Value Date  ? WBC 8.1 12/20/2020  ? HGB 12.8 12/20/2020  ? HCT 39.4 12/20/2020  ? MCV 85 12/20/2020  ? PLT 350 12/20/2020  ? ?Lab Results  ?Component Value Date  ? IRON 76 12/20/2020  ? TIBC 416 12/20/2020  ? FERRITIN 23 12/20/2020  ? ?Attestation Statements:  ? ?Reviewed by clinician on day of visit: allergies, medications, problem list, medical history, surgical history, family history, social history, and previous encounter notes. ? ?I, Insurance claims handler, CMA, am acting as Energy manager for William Hamburger, NP. ? ?I have reviewed the above documentation for accuracy and completeness, and I agree with the above. -  Kaydan Wong d. Fabian Coca, NP-C ?

## 2021-12-26 ENCOUNTER — Encounter (INDEPENDENT_AMBULATORY_CARE_PROVIDER_SITE_OTHER): Payer: Self-pay

## 2021-12-26 ENCOUNTER — Telehealth (INDEPENDENT_AMBULATORY_CARE_PROVIDER_SITE_OTHER): Payer: Self-pay | Admitting: Adult Health

## 2021-12-26 NOTE — Telephone Encounter (Signed)
Prior authorization denied for [facility]. Per insurance: Plan Exclusion Plan Exclusion ?This decision relates specifically to coverage provided under your prescription benefit plan. Patient sent denial message via mychart.  ?

## 2021-12-31 ENCOUNTER — Encounter (INDEPENDENT_AMBULATORY_CARE_PROVIDER_SITE_OTHER): Payer: Self-pay | Admitting: Adult Health

## 2022-01-07 ENCOUNTER — Other Ambulatory Visit (INDEPENDENT_AMBULATORY_CARE_PROVIDER_SITE_OTHER): Payer: Self-pay | Admitting: Adult Health

## 2022-01-07 ENCOUNTER — Encounter (INDEPENDENT_AMBULATORY_CARE_PROVIDER_SITE_OTHER): Payer: Self-pay | Admitting: Adult Health

## 2022-01-07 MED ORDER — OZEMPIC (0.25 OR 0.5 MG/DOSE) 2 MG/1.5ML ~~LOC~~ SOPN: 0.2500 mg | PEN_INJECTOR | SUBCUTANEOUS | 0 refills | Status: DC

## 2022-01-07 NOTE — Telephone Encounter (Signed)
Pt requesting to switch back to Ozempic.

## 2022-01-25 ENCOUNTER — Other Ambulatory Visit (INDEPENDENT_AMBULATORY_CARE_PROVIDER_SITE_OTHER): Payer: Self-pay | Admitting: Adult Health

## 2022-01-25 DIAGNOSIS — E559 Vitamin D deficiency, unspecified: Secondary | ICD-10-CM

## 2022-02-03 ENCOUNTER — Ambulatory Visit (INDEPENDENT_AMBULATORY_CARE_PROVIDER_SITE_OTHER): Payer: 59 | Admitting: Adult Health

## 2022-02-23 ENCOUNTER — Other Ambulatory Visit (INDEPENDENT_AMBULATORY_CARE_PROVIDER_SITE_OTHER): Payer: Self-pay | Admitting: Adult Health

## 2022-02-23 DIAGNOSIS — K219 Gastro-esophageal reflux disease without esophagitis: Secondary | ICD-10-CM

## 2022-02-26 ENCOUNTER — Other Ambulatory Visit (INDEPENDENT_AMBULATORY_CARE_PROVIDER_SITE_OTHER): Payer: Self-pay | Admitting: Adult Health

## 2022-02-26 DIAGNOSIS — F3289 Other specified depressive episodes: Secondary | ICD-10-CM

## 2022-02-26 DIAGNOSIS — K219 Gastro-esophageal reflux disease without esophagitis: Secondary | ICD-10-CM

## 2022-02-26 MED ORDER — PANTOPRAZOLE SODIUM 40 MG PO TBEC: DELAYED_RELEASE_TABLET | ORAL | 0 refills | Status: DC

## 2022-02-26 MED ORDER — ESCITALOPRAM OXALATE 10 MG PO TABS: 10.0000 mg | ORAL_TABLET | Freq: Every day | ORAL | 0 refills | Status: DC

## 2022-02-26 NOTE — Telephone Encounter (Signed)
LAST APPOINTMENT DATE: 12/25/21 ?NEXT APPOINTMENT DATE: NO SHOW 02/03/22  ? ?Next appt.03/04/22 ? ? ?[facility] #[id number] [place], [place] - [address] AT [facility] [address] ?[address] ?[place] [address] ?Phone: [phone] Fax: [phone] ? ?[id number] [place], [place] - [address] ?[address] ? [place] [address] ?Phone: [phone] Fax: [phone] ? ?[facility] - [place], [place] - [address] ?[address] ?[place] [address] ?Phone: [phone] Fax: [phone] ? ?Patient is requesting a refill of the following medications: ?Pending Prescriptions:                       Disp   Refills ?  escitalopram (LEXAPRO) 10 MG tablet        90 tab*0       ?Sig: Take 1 tablet (10 mg total) by mouth daily. ?  pantoprazole (PROTONIX) 40 MG tablet       90 tab*0       ?Sig: TAKE 1 TABLET(40 MG) BY MOUTH DAILY ? ? ?Date last filled: 11/28/21 ?Previously prescribed by [facility] ? ?Lab Results ?     Component                Value               Date                 ?     HGBA1C                   5.6                 08/15/2021           ?     HGBA1C                   5.2                 12/20/2020           ?     HGBA1C                   5.3                 07/25/2020           ?Lab Results ?     Component                Value               Date                 ?     LDLCALC                  163 (H)             08/15/2021           ?     CREATININE               0.70                08/15/2021           ?Lab Results ?     Component                Value  Date                 ?     VD25OH                   24.9 (L)            08/15/2021           ?     VD25OH                   26.6 (L)            12/20/2020           ?     VD25OH                   38.9                07/25/2020           ? ?BP Readings from Last 3 Encounters: ?12/25/21 : 117/79 ?10/31/21 : 115/74 ?10/03/21 : 128/80 ?

## 2022-03-04 ENCOUNTER — Encounter (INDEPENDENT_AMBULATORY_CARE_PROVIDER_SITE_OTHER): Payer: Self-pay | Admitting: Adult Health

## 2022-03-04 ENCOUNTER — Ambulatory Visit (INDEPENDENT_AMBULATORY_CARE_PROVIDER_SITE_OTHER): Payer: 59 | Admitting: Adult Health

## 2022-03-04 VITALS — BP 120/81 | HR 94 | Temp 98.4°F | Ht 68.0 in | Wt 369.0 lb

## 2022-03-04 DIAGNOSIS — Z6841 Body Mass Index (BMI) 40.0 and over, adult: Secondary | ICD-10-CM

## 2022-03-04 DIAGNOSIS — E8881 Metabolic syndrome: Secondary | ICD-10-CM

## 2022-03-04 DIAGNOSIS — Z9189 Other specified personal risk factors, not elsewhere classified: Secondary | ICD-10-CM

## 2022-03-04 DIAGNOSIS — E538 Deficiency of other specified B group vitamins: Secondary | ICD-10-CM

## 2022-03-04 DIAGNOSIS — E559 Vitamin D deficiency, unspecified: Secondary | ICD-10-CM

## 2022-03-04 DIAGNOSIS — E782 Mixed hyperlipidemia: Secondary | ICD-10-CM | POA: Diagnosis not present

## 2022-03-04 DIAGNOSIS — E669 Obesity, unspecified: Secondary | ICD-10-CM

## 2022-03-04 MED ORDER — OZEMPIC (0.25 OR 0.5 MG/DOSE) 2 MG/1.5ML ~~LOC~~ SOPN
0.5000 mg | PEN_INJECTOR | SUBCUTANEOUS | 0 refills | Status: DC
Start: 2022-03-04 — End: 2022-04-01

## 2022-03-05 ENCOUNTER — Encounter (INDEPENDENT_AMBULATORY_CARE_PROVIDER_SITE_OTHER): Payer: Self-pay | Admitting: Adult Health

## 2022-03-05 ENCOUNTER — Other Ambulatory Visit (INDEPENDENT_AMBULATORY_CARE_PROVIDER_SITE_OTHER): Payer: Self-pay | Admitting: Adult Health

## 2022-03-05 DIAGNOSIS — E559 Vitamin D deficiency, unspecified: Secondary | ICD-10-CM

## 2022-03-05 LAB — LIPID PANEL
Chol/HDL Ratio: 3.5 ratio (ref 0.0–4.4)
Cholesterol, Total: 183 mg/dL (ref 100–199)
HDL: 53 mg/dL (ref >39)
LDL Chol Calc (NIH): 108 mg/dL — ABNORMAL HIGH (ref 0–99)
Triglycerides: 123 mg/dL (ref 0–149)
VLDL Cholesterol Cal: 22 mg/dL (ref 5–40)

## 2022-03-05 LAB — COMPREHENSIVE METABOLIC PANEL
ALT: 32 IU/L (ref 0–32)
AST: 22 IU/L (ref 0–40)
Albumin/Globulin Ratio: 1.5 (ref 1.2–2.2)
Albumin: 4 g/dL (ref 3.9–5.0)
Alkaline Phosphatase: 107 IU/L (ref 44–121)
BUN/Creatinine Ratio: 15 (ref 9–23)
BUN: 10 mg/dL (ref 6–20)
Bilirubin Total: 0.2 mg/dL (ref 0.0–1.2)
CO2: 23 mmol/L (ref 20–29)
Calcium: 9 mg/dL (ref 8.7–10.2)
Chloride: 104 mmol/L (ref 96–106)
Creatinine, Ser: 0.65 mg/dL (ref 0.57–1.00)
Globulin, Total: 2.6 g/dL (ref 1.5–4.5)
Glucose: 77 mg/dL (ref 70–99)
Potassium: 4.6 mmol/L (ref 3.5–5.2)
Sodium: 140 mmol/L (ref 134–144)
Total Protein: 6.6 g/dL (ref 6.0–8.5)
eGFR: 124 mL/min/{1.73_m2} (ref >59)

## 2022-03-05 LAB — INSULIN, RANDOM: INSULIN: 17.7 u[IU]/mL (ref 2.6–24.9)

## 2022-03-05 LAB — VITAMIN B12: Vitamin B-12: 452 pg/mL (ref 232–1245)

## 2022-03-05 LAB — HEMOGLOBIN A1C
Est. average glucose Bld gHb Est-mCnc: 111 mg/dL
Hgb A1c MFr Bld: 5.5 % (ref 4.8–5.6)

## 2022-03-05 LAB — VITAMIN D 25 HYDROXY (VIT D DEFICIENCY, FRACTURES): Vit D, 25-Hydroxy: 27.1 ng/mL — ABNORMAL LOW (ref 30.0–100.0)

## 2022-03-05 MED ORDER — VITAMIN D (ERGOCALCIFEROL) 1.25 MG (50000 UNIT) PO CAPS: 50000.0000 [IU] | ORAL_CAPSULE | ORAL | 0 refills | Status: DC

## 2022-03-06 NOTE — Progress Notes (Signed)
Chief Complaint:   OBESITY Ashley Acevedo is here to discuss her progress with her obesity treatment plan along with follow-up of her obesity related diagnoses. Ashley Acevedo is on the Category 2 Plan and states she is following her eating plan approximately 30% of the time. Ashley Acevedo states she is walking for 30 minutes 3 times per week.  Today's visit was #: 33 Starting weight: 295 lbs Starting date: 08/24/2019 Today's weight: 369 lbs Today's date: 03/04/22 Total lbs lost to date: 0 Total lbs lost since last in-office visit: +7  Interim History:  She has experienced the following life events since last OV: 1) Pipe burst in home. 2) Lived with parents April/May.   3) Challenged to eat on plan.    She was Started on Wegovy 12/25/2021-however her insurance denied coverage.  Ashley Acevedo was ordered and subsequently covered by insurance. Ashley Acevedo 0.25 mg  taken on 3/30, 4/9, 4/16, 4/30 Ashley Acevedo 0.5 mg taken on 5/7, 5/14 She is tolerating GLP- 1 therapy well.  Subjective:   1. Vitamin D deficiency She is on ergocalciferol 50,000 IU once weekly.  2. Insulin resistance She was Started on Wegovy 12/25/2021-however her insurance denied coverage.  Ashley Acevedo was ordered and subsequently covered by insurance. Ashley Acevedo 0.25 mg  taken on 3/30, 4/9, 4/16, 4/30 Ashley Acevedo 0.5 mg taken on 5/7, 5/14 She denies mass in neck, dysphagia, dyspepsia, persistent hoarseness, abd pain, persistent hoarseness, or N/V/Constipation.  3. Vitamin B12 deficiency She endorses recent increase in fatigue.  4. Mixed hyperlipidemia Her father has hyperlipidemia, A-fib. She denies acute cardiac symptoms.  5. At risk for diabetes mellitus Related to insulin resistance and obesity.  Assessment/Plan:   1. Vitamin D deficiency Check labs.   - VITAMIN D 25 Hydroxy (Vit-D Deficiency, Fractures)  2. Insulin resistance Check labs. - Hemoglobin A1c - Insulin, random  Refill and Increase dose Ashley Acevedo 0.5 mg once weekly, dispense 2 mL,  0 refills.  3. Vitamin B12 deficiency Check Labs. - Vitamin B12  4. Mixed hyperlipidemia Check labs. - Comprehensive metabolic panel - Lipid panel  5. At risk for diabetes mellitus Ashley Acevedo was given approximately 15 minutes of diabetic education and counseling today. We discussed intensive lifestyle modifications today with an emphasis on weight loss as well as increasing exercise and decreasing simple carbohydrates in her diet. We also reviewed medication options with an emphasis on risk versus benefits of those discussed.  Repetitive spaced learning was employed today to elicit superior memory formation and behavioral change.   6. Obesity, current BMI 56.2 Ashley Acevedo is currently in the action stage of change. As such, her goal is to continue with weight loss efforts. She has agreed to the Category 2 Plan.   Refill and Increase dose Ashley Acevedo 0.5 mg once weekly, dispense 2 mL, 0 refills.  Exercise goals: as is.  Behavioral modification strategies: increasing lean protein intake, decreasing simple carbohydrates, meal planning and cooking strategies, keeping healthy foods in the home, and planning for success.  Ashley Acevedo has agreed to follow-up with our clinic in 3-4 weeks. She was informed of the importance of frequent follow-up visits to maximize her success with intensive lifestyle modifications for her multiple health conditions.   Ashley Acevedo was informed we would discuss her lab results at her next visit unless there is a critical issue that needs to be addressed sooner. Ashley Acevedo agreed to keep her next visit at the agreed upon time to discuss these results.  Objective:   Blood pressure 120/81, pulse 94, temperature 98.4 F (36.9 C), height  5\' 8"  (1.727 m), weight (!) 369 lb (167.4 kg), SpO2 97 %. Body mass index is 56.11 kg/m.  General: Cooperative, alert, well developed, in no acute distress. HEENT: Conjunctivae and lids unremarkable. Cardiovascular: Regular rhythm.  Lungs: Normal work of  breathing. Neurologic: No focal deficits.   Lab Results  Component Value Date   CREATININE 0.65 03/04/2022   BUN 10 03/04/2022   NA 140 03/04/2022   K 4.6 03/04/2022   CL 104 03/04/2022   CO2 23 03/04/2022   Lab Results  Component Value Date   ALT 32 03/04/2022   AST 22 03/04/2022   ALKPHOS 107 03/04/2022   BILITOT 0.2 03/04/2022   Lab Results  Component Value Date   HGBA1C 5.5 03/04/2022   HGBA1C 5.6 08/15/2021   HGBA1C 5.2 12/20/2020   HGBA1C 5.3 07/25/2020   HGBA1C 5.1 04/11/2020   Lab Results  Component Value Date   INSULIN 17.7 03/04/2022   INSULIN 103.0 (H) 08/15/2021   INSULIN 19.7 12/20/2020   INSULIN 26.0 (H) 07/25/2020   INSULIN 26.0 (H) 04/11/2020   No results found for: TSH Lab Results  Component Value Date   CHOL 183 03/04/2022   HDL 53 03/04/2022   LDLCALC 108 (H) 03/04/2022   TRIG 123 03/04/2022   CHOLHDL 3.5 03/04/2022   Lab Results  Component Value Date   VD25OH 27.1 (L) 03/04/2022   VD25OH 24.9 (L) 08/15/2021   VD25OH 26.6 (L) 12/20/2020   Lab Results  Component Value Date   WBC 8.1 12/20/2020   HGB 12.8 12/20/2020   HCT 39.4 12/20/2020   MCV 85 12/20/2020   PLT 350 12/20/2020   Lab Results  Component Value Date   IRON 76 12/20/2020   TIBC 416 12/20/2020   FERRITIN 23 12/20/2020     Attestation Statements:   Reviewed by clinician on day of visit: allergies, medications, problem list, medical history, surgical history, family history, social history, and previous encounter notes.  I, 02/19/2021, FNP, am acting as Jesse Sans for Energy manager, NP.  I have reviewed the above documentation for accuracy and completeness, and I agree with the above. -  Ashley Espino d. Geral Coker, NP-C

## 2022-03-11 DIAGNOSIS — E538 Deficiency of other specified B group vitamins: Secondary | ICD-10-CM

## 2022-03-11 HISTORY — DX: Deficiency of other specified B group vitamins: E53.8

## 2022-03-27 ENCOUNTER — Ambulatory Visit (INDEPENDENT_AMBULATORY_CARE_PROVIDER_SITE_OTHER): Payer: 59 | Admitting: Adult Health

## 2022-04-01 ENCOUNTER — Ambulatory Visit (INDEPENDENT_AMBULATORY_CARE_PROVIDER_SITE_OTHER): Payer: 59 | Admitting: Adult Health

## 2022-04-01 ENCOUNTER — Encounter (INDEPENDENT_AMBULATORY_CARE_PROVIDER_SITE_OTHER): Payer: Self-pay | Admitting: Adult Health

## 2022-04-01 VITALS — BP 133/84 | HR 91 | Temp 98.3°F | Ht 68.0 in | Wt 366.0 lb

## 2022-04-01 DIAGNOSIS — E669 Obesity, unspecified: Secondary | ICD-10-CM

## 2022-04-01 DIAGNOSIS — E8881 Metabolic syndrome: Secondary | ICD-10-CM | POA: Diagnosis not present

## 2022-04-01 DIAGNOSIS — Z9189 Other specified personal risk factors, not elsewhere classified: Secondary | ICD-10-CM | POA: Insufficient documentation

## 2022-04-01 DIAGNOSIS — E7849 Other hyperlipidemia: Secondary | ICD-10-CM

## 2022-04-01 DIAGNOSIS — E559 Vitamin D deficiency, unspecified: Secondary | ICD-10-CM

## 2022-04-01 DIAGNOSIS — Z6841 Body Mass Index (BMI) 40.0 and over, adult: Secondary | ICD-10-CM

## 2022-04-01 MED ORDER — OZEMPIC (0.25 OR 0.5 MG/DOSE) 2 MG/1.5ML ~~LOC~~ SOPN: 0.5000 mg | PEN_INJECTOR | SUBCUTANEOUS | 0 refills | Status: DC

## 2022-04-01 MED ORDER — VITAMIN D (ERGOCALCIFEROL) 1.25 MG (50000 UNIT) PO CAPS: 50000.0000 [IU] | ORAL_CAPSULE | ORAL | 0 refills | Status: DC

## 2022-04-02 NOTE — Progress Notes (Signed)
Chief Complaint:   OBESITY Ashley Acevedo is here to discuss her progress with her obesity treatment plan along with follow-up of her obesity related diagnoses. Ashley Acevedo is on the Category 2 Plan and states she is following her eating plan approximately 60% of the time. Ashley Acevedo states she is walking for 30 to 45 minutes 2-3 times per week.  Today's visit was #: 34 Starting weight: 295 lbs Starting date: 08/24/2019 Today's weight: 366 lbs Today's date: 04/01/22 Total lbs lost to date: 0 Total lbs lost since last in-office visit: -3  Interim History:  Started on Ozempic 01/09/2022, increased Ozempic 0.5 mg 02/16/2022. She has had 2 doses of Ozempic 0.5 mg-tolerating well. She will travel to [place] for 24 June through 5 July. She will travel to [place] 14-30 July.  Subjective:   1. Insulin resistance 03/04/2022 blood glucose 77, A1c 5.5, insulin level 17.1. Started on Ozempic 01/09/2022, increased Ozempic 0.5 mg 02/16/2022. She has had 2 doses of Ozempic 0.5 mg-tolerating well. Discussed labs with patient today.  2. Vitamin D deficiency 03/04/2022 vitamin D level-27.1-continues to be subtherapeutic despite ergocalciferol 2 times per week. Discussed labs with patient today.  3. Pure hyperlipidemia Lipid Panel-    Component Value Date/Time   CHOL 183 03/04/2022 1113   TRIG 123 03/04/2022 1113   HDL 53 03/04/2022 1113   CHOLHDL 3.5 03/04/2022 1113   LDLCALC 108 (H) 03/04/2022 1113   LABVLDL 22 03/04/2022 1113   Great improvement!. Discussed labs with patient today.  4. At risk for osteoporosis Related to vitamin D deficiency, obesity.  Assessment/Plan:   1. Insulin resistance Refill  - Ozempic 0.5 mg once weekly, dispense 2 mL, 0 refill.  2. Vitamin D deficiency Refill - Vitamin D, Ergocalciferol, (DRISDOL) 1.25 MG (50000 UNIT) CAPS capsule; Take 1 capsule (50,000 Units total) by mouth every 3 (three) days. Take 1 cap every 3 days  Dispense: 8 capsule; Refill: 0  3. Pure  hyperlipidemia Continue category 2 meal plan.  4. At risk for osteoporosis Ashley Acevedo was given approximately 15 minutes of osteoporosis prevention counseling today. Ashley Acevedo is at risk for osteopenia and osteoporosis due to her Vitamin D deficiency. She was encouraged to take her Vit D and follow her higher calcium diet and increase strengthening exercise to help strengthen her bones and decrease her risk of osteopenia and osteoporosis.   5. Obesity, current BMI 55.7 Ashley Acevedo is currently in the action stage of change. As such, her goal is to continue with weight loss efforts. She has agreed to the Category 2 Plan.   Exercise goals:  as is.  Behavioral modification strategies: increasing lean protein intake, decreasing simple carbohydrates, meal planning and cooking strategies, keeping healthy foods in the home, and planning for success.  Ashley Acevedo has agreed to follow-up with our clinic in 4 weeks. She was informed of the importance of frequent follow-up visits to maximize her success with intensive lifestyle modifications for her multiple health conditions.  Objective:   Blood pressure 133/84, pulse 91, temperature 98.3 F (36.8 C), height 5\' 8"  (1.727 m), weight (!) 366 lb (166 kg), SpO2 96 %. Body mass index is 55.65 kg/m.  General: Cooperative, alert, well developed, in no acute distress. HEENT: Conjunctivae and lids unremarkable. Cardiovascular: Regular rhythm.  Lungs: Normal work of breathing. Neurologic: No focal deficits.   Lab Results  Component Value Date   CREATININE 0.65 03/04/2022   BUN 10 03/04/2022   NA 140 03/04/2022   K 4.6 03/04/2022   CL  104 03/04/2022   CO2 23 03/04/2022   Lab Results  Component Value Date   ALT 32 03/04/2022   AST 22 03/04/2022   ALKPHOS 107 03/04/2022   BILITOT 0.2 03/04/2022   Lab Results  Component Value Date   HGBA1C 5.5 03/04/2022   HGBA1C 5.6 08/15/2021   HGBA1C 5.2 12/20/2020   HGBA1C 5.3 07/25/2020   HGBA1C 5.1 04/11/2020   Lab  Results  Component Value Date   INSULIN 17.7 03/04/2022   INSULIN 103.0 (H) 08/15/2021   INSULIN 19.7 12/20/2020   INSULIN 26.0 (H) 07/25/2020   INSULIN 26.0 (H) 04/11/2020   No results found for: "TSH" Lab Results  Component Value Date   CHOL 183 03/04/2022   HDL 53 03/04/2022   LDLCALC 108 (H) 03/04/2022   TRIG 123 03/04/2022   CHOLHDL 3.5 03/04/2022   Lab Results  Component Value Date   VD25OH 27.1 (L) 03/04/2022   VD25OH 24.9 (L) 08/15/2021   VD25OH 26.6 (L) 12/20/2020   Lab Results  Component Value Date   WBC 8.1 12/20/2020   HGB 12.8 12/20/2020   HCT 39.4 12/20/2020   MCV 85 12/20/2020   PLT 350 12/20/2020   Lab Results  Component Value Date   IRON 76 12/20/2020   TIBC 416 12/20/2020   FERRITIN 23 12/20/2020     Attestation Statements:   Reviewed by clinician on day of visit: allergies, medications, problem list, medical history, surgical history, family history, social history, and previous encounter notes.  I, Jesse Sans, FNP, am acting as Energy manager for William Hamburger, NP.  I have reviewed the above documentation for accuracy and completeness, and I agree with the above. -  Andriana Casa d. Jazlen Ogarro, NP-C

## 2022-04-21 NOTE — Progress Notes (Deleted)
TeleHealth Visit:  This visit was completed with telemedicine (audio/video) technology. Ashley Acevedo has verbally consented to this TeleHealth visit. The patient is located at home, the provider is located at home. The participants in this visit include the listed provider and patient. The visit was conducted today via MyChart video.  OBESITY Ashley Acevedo is here to discuss her progress with her obesity treatment plan along with follow-up of her obesity related diagnoses.   Today's visit was # 35 Starting weight: 295 lbs Starting date: 08/24/2019 Weight at last in office visit: 366 lbs on 04/01/22 Total weight loss: 0 lbs at last in office visit on 04/01/22. Today's reported weight: *** lbs No weight reported.  Nutrition Plan: the Category 2 Plan.  Hunger is {EWCONTROLASSESSMENT:24261}. Cravings are {EWCONTROLASSESSMENT:24261}.  Current exercise: {exercise types:16438}  Interim History: ***  Assessment/Plan:  1. ***  2. ***  3. ***  Obesity: Current BMI *** Ashley Acevedo {CHL AMB IS/IS NOT:210130109} currently in the action stage of change. As such, her goal is to {MWMwtloss#1:210800005}.  She has agreed to {MWMwtlossportion/plan2:23431}.   Exercise goals: {MWM EXERCISE RECS:23473}  Behavioral modification strategies: {MWMwtlossdietstrategies3:23432}.  Ashley Acevedo has agreed to follow-up with our clinic in {NUMBER 1-10:22536} weeks.   No orders of the defined types were placed in this encounter.   There are no discontinued medications.   No orders of the defined types were placed in this encounter.     Objective:   VITALS: Per patient if applicable, see vitals. GENERAL: Alert and in no acute distress. CARDIOPULMONARY: No increased WOB. Speaking in clear sentences.  PSYCH: Pleasant and cooperative. Speech normal rate and rhythm. Affect is appropriate. Insight and judgement are appropriate. Attention is focused, linear, and appropriate.  NEURO: Oriented as arrived to appointment on time  with no prompting.   Lab Results  Component Value Date   CREATININE 0.65 03/04/2022   BUN 10 03/04/2022   NA 140 03/04/2022   K 4.6 03/04/2022   CL 104 03/04/2022   CO2 23 03/04/2022   Lab Results  Component Value Date   ALT 32 03/04/2022   AST 22 03/04/2022   ALKPHOS 107 03/04/2022   BILITOT 0.2 03/04/2022   Lab Results  Component Value Date   HGBA1C 5.5 03/04/2022   HGBA1C 5.6 08/15/2021   HGBA1C 5.2 12/20/2020   HGBA1C 5.3 07/25/2020   HGBA1C 5.1 04/11/2020   Lab Results  Component Value Date   INSULIN 17.7 03/04/2022   INSULIN 103.0 (H) 08/15/2021   INSULIN 19.7 12/20/2020   INSULIN 26.0 (H) 07/25/2020   INSULIN 26.0 (H) 04/11/2020   No results found for: "TSH" Lab Results  Component Value Date   CHOL 183 03/04/2022   HDL 53 03/04/2022   LDLCALC 108 (H) 03/04/2022   TRIG 123 03/04/2022   CHOLHDL 3.5 03/04/2022   Lab Results  Component Value Date   WBC 8.1 12/20/2020   HGB 12.8 12/20/2020   HCT 39.4 12/20/2020   MCV 85 12/20/2020   PLT 350 12/20/2020   Lab Results  Component Value Date   IRON 76 12/20/2020   TIBC 416 12/20/2020   FERRITIN 23 12/20/2020   Lab Results  Component Value Date   VD25OH 27.1 (L) 03/04/2022   VD25OH 24.9 (L) 08/15/2021   VD25OH 26.6 (L) 12/20/2020    Attestation Statements:   Reviewed by clinician on day of visit: allergies, medications, problem list, medical history, surgical history, family history, social history, and previous encounter notes.  ***(delete if time-based billing not used) Time spent on  visit including the items listed below was *** minutes.  -preparing to see the patient (e.g., review of tests, history, previous notes) -obtaining and/or reviewing separately obtained history -counseling and educating the patient/family/caregiver -documenting clinical information in the electronic or other health record -ordering medications, tests, or procedures -independently interpreting results and  communicating results to the patient/ family/caregiver -referring and communicating with other health care professionals  -care coordination

## 2022-04-22 ENCOUNTER — Telehealth (INDEPENDENT_AMBULATORY_CARE_PROVIDER_SITE_OTHER): Payer: 59 | Admitting: Family Medicine

## 2022-04-22 ENCOUNTER — Encounter (INDEPENDENT_AMBULATORY_CARE_PROVIDER_SITE_OTHER): Payer: Self-pay | Admitting: Family Medicine

## 2022-04-22 NOTE — Progress Notes (Signed)
Patient was a no-show for virtual visit. Left VM advising patient to call to reschedule.  

## 2022-05-21 ENCOUNTER — Encounter (INDEPENDENT_AMBULATORY_CARE_PROVIDER_SITE_OTHER): Payer: Self-pay

## 2022-05-21 ENCOUNTER — Ambulatory Visit (INDEPENDENT_AMBULATORY_CARE_PROVIDER_SITE_OTHER): Payer: 59 | Admitting: Adult Health

## 2022-05-21 ENCOUNTER — Encounter (INDEPENDENT_AMBULATORY_CARE_PROVIDER_SITE_OTHER): Payer: Self-pay | Admitting: Adult Health

## 2022-05-21 VITALS — BP 134/89 | HR 93 | Temp 97.9°F | Ht 68.0 in | Wt 365.0 lb

## 2022-05-21 DIAGNOSIS — E559 Vitamin D deficiency, unspecified: Secondary | ICD-10-CM | POA: Diagnosis not present

## 2022-05-21 DIAGNOSIS — E8881 Metabolic syndrome: Secondary | ICD-10-CM

## 2022-05-21 DIAGNOSIS — K219 Gastro-esophageal reflux disease without esophagitis: Secondary | ICD-10-CM

## 2022-05-21 DIAGNOSIS — F3289 Other specified depressive episodes: Secondary | ICD-10-CM

## 2022-05-21 DIAGNOSIS — Z9189 Other specified personal risk factors, not elsewhere classified: Secondary | ICD-10-CM

## 2022-05-21 DIAGNOSIS — E669 Obesity, unspecified: Secondary | ICD-10-CM

## 2022-05-21 DIAGNOSIS — Z6841 Body Mass Index (BMI) 40.0 and over, adult: Secondary | ICD-10-CM

## 2022-05-21 MED ORDER — PANTOPRAZOLE SODIUM 40 MG PO TBEC: DELAYED_RELEASE_TABLET | ORAL | 0 refills | Status: DC

## 2022-05-21 MED ORDER — VITAMIN D (ERGOCALCIFEROL) 1.25 MG (50000 UNIT) PO CAPS: 50000.0000 [IU] | ORAL_CAPSULE | ORAL | 0 refills | Status: DC

## 2022-05-21 MED ORDER — OZEMPIC (0.25 OR 0.5 MG/DOSE) 2 MG/1.5ML ~~LOC~~ SOPN: 0.5000 mg | PEN_INJECTOR | SUBCUTANEOUS | 0 refills | Status: DC

## 2022-05-21 MED ORDER — ESCITALOPRAM OXALATE 10 MG PO TABS: 10.0000 mg | ORAL_TABLET | Freq: Every day | ORAL | 0 refills | Status: DC

## 2022-05-27 NOTE — Progress Notes (Signed)
Chief Complaint:   OBESITY Ashley Acevedo is here to discuss her progress with her obesity treatment plan along with follow-up of her obesity related diagnoses. Ashley Acevedo is on the Category 2 Plan and states she is following her eating plan approximately 50% of the time. Ashley Acevedo states she is walking 30 minutes 3 times per week.  Today's visit was #: 35 Starting weight: 295 lbs Starting date: 08/24/2019 Today's weight: 365 lbs Today's date: 05/21/2022 Total lbs lost to date: 0 lbs Total lbs lost since last in-office visit: 1  Interim History:  Ashley Acevedo has traveled to [place] for 2 weeks--visiting her husband's family.  She estimates to have followed Cat 2 meal approximately 50% plan to extensive travel.  Now stateside, she will increase her plan compliance.  Subjective:   1. Insulin resistance On 01/09/22 Ashley Acevedo started on Ozempic 0.25 mg.  Ozempic 0.5 mg since 02/16/22.  She held Ozempic 0.5 mg  while traveling---last dose of Ozempic 0.5 mg on 04/30/22. When taking Ozempic she denies mass in neck, dysphagia, dyspepsia, persistent hoarseness, abd pain, or N/V/Constipation.  2. Vitamin D deficiency Ashley Acevedo is on E twice weekly Ergocalciferol therapy.  3. Gastroesophageal reflux disease, unspecified whether esophagitis present Ashley Acevedo acid reflux were controlled with daily Pantoprazole 40 mg.  4. Other depression, with emotional eating Ashley Acevedo reports stable mood. Denies suicidal ideas, and homicidal ideas.  5. At risk for nausea Ashley Acevedo is at risk for nausea due to Ozempic.  Assessment/Plan:   1. Insulin resistance Ashley Acevedo will restart Ozempic 0.25 mg for 2 weeks then increase to 0.5 mg.  She does not need RF today.  2. Vitamin D deficiency We will refill Ergocalciferol 50,000 IU once a week for 1 month with 0 refills.  -Refill Vitamin D, Ergocalciferol, (DRISDOL) 1.25 MG (50000 UNIT) CAPS capsule; Take 1 capsule (50,000 Units total) by mouth every 3 (three) days. Take 1 cap every 3 days   Dispense: 8 capsule; Refill: 0  3. Gastroesophageal reflux disease, unspecified whether esophagitis present We will refill Protonix 40 mg daily for 3 months with 0 refills.  -Refill pantoprazole (PROTONIX) 40 MG tablet; TAKE 1 TABLET(40 MG) BY MOUTH DAILY  Dispense: 90 tablet; Refill: 0  4. Other depression, with emotional eating We will refill Lexapro 10 mg daily for 3 months with 0 refills.  -Refill escitalopram (LEXAPRO) 10 MG tablet; Take 1 tablet (10 mg total) by mouth daily.  Dispense: 90 tablet; Refill: 0  5. At risk for nausea Ashley Acevedo was given approximately 15 minutes of nausea prevention counseling today. Ashley Acevedo is at risk for nausea due to her new or current medication. She was encouraged to titrate her medication slowly, make sure to stay hydrated, eat smaller portions throughout the day, and avoid high fat meals.   6. Obesity, current BMI 55.6 Ashley Acevedo is currently in the action stage of change. As such, her goal is to continue with weight loss efforts. She has agreed to the Category 2 Plan.   Exercise goals: As is.  Behavioral modification strategies: increasing lean protein intake, decreasing simple carbohydrates, meal planning and cooking strategies, keeping healthy foods in the home, and planning for success.  Ashley Acevedo has agreed to follow-up with our clinic in 4 weeks. She was informed of the importance of frequent follow-up visits to maximize her success with intensive lifestyle modifications for her multiple health conditions.   Objective:   Blood pressure 134/89, pulse 93, temperature 97.9 F (36.6 C), height 5\' 8"  (1.727 m), weight (!) 365  lb (165.6 kg), SpO2 98 %. Body mass index is 55.5 kg/m.  General: Cooperative, alert, well developed, in no acute distress. HEENT: Conjunctivae and lids unremarkable. Cardiovascular: Regular rhythm.  Lungs: Normal work of breathing. Neurologic: No focal deficits.   Lab Results  Component Value Date   CREATININE 0.65  03/04/2022   BUN 10 03/04/2022   NA 140 03/04/2022   K 4.6 03/04/2022   CL 104 03/04/2022   CO2 23 03/04/2022   Lab Results  Component Value Date   ALT 32 03/04/2022   AST 22 03/04/2022   ALKPHOS 107 03/04/2022   BILITOT 0.2 03/04/2022   Lab Results  Component Value Date   HGBA1C 5.5 03/04/2022   HGBA1C 5.6 08/15/2021   HGBA1C 5.2 12/20/2020   HGBA1C 5.3 07/25/2020   HGBA1C 5.1 04/11/2020   Lab Results  Component Value Date   INSULIN 17.7 03/04/2022   INSULIN 103.0 (H) 08/15/2021   INSULIN 19.7 12/20/2020   INSULIN 26.0 (H) 07/25/2020   INSULIN 26.0 (H) 04/11/2020   No results found for: "TSH" Lab Results  Component Value Date   CHOL 183 03/04/2022   HDL 53 03/04/2022   LDLCALC 108 (H) 03/04/2022   TRIG 123 03/04/2022   CHOLHDL 3.5 03/04/2022   Lab Results  Component Value Date   VD25OH 27.1 (L) 03/04/2022   VD25OH 24.9 (L) 08/15/2021   VD25OH 26.6 (L) 12/20/2020   Lab Results  Component Value Date   WBC 8.1 12/20/2020   HGB 12.8 12/20/2020   HCT 39.4 12/20/2020   MCV 85 12/20/2020   PLT 350 12/20/2020   Lab Results  Component Value Date   IRON 76 12/20/2020   TIBC 416 12/20/2020   FERRITIN 23 12/20/2020   Attestation Statements:   Reviewed by clinician on day of visit: allergies, medications, problem list, medical history, surgical history, family history, social history, and previous encounter notes.  I, Brendell Tyus, RMA, am acting as transcriptionist for William Hamburger, NP.  I have reviewed the above documentation for accuracy and completeness, and I agree with the above. - Kuper Rennels d. Mercer Peifer, NP-C

## 2022-06-23 ENCOUNTER — Ambulatory Visit (INDEPENDENT_AMBULATORY_CARE_PROVIDER_SITE_OTHER): Payer: 59 | Admitting: Adult Health

## 2022-07-07 ENCOUNTER — Encounter: Payer: Self-pay | Admitting: *Deleted

## 2022-07-08 ENCOUNTER — Ambulatory Visit (INDEPENDENT_AMBULATORY_CARE_PROVIDER_SITE_OTHER): Payer: 59 | Admitting: Adult Health

## 2022-08-11 ENCOUNTER — Encounter (INDEPENDENT_AMBULATORY_CARE_PROVIDER_SITE_OTHER): Payer: Self-pay | Admitting: Adult Health

## 2022-08-11 ENCOUNTER — Ambulatory Visit (INDEPENDENT_AMBULATORY_CARE_PROVIDER_SITE_OTHER): Payer: 59 | Admitting: Adult Health

## 2022-08-11 VITALS — BP 127/73 | HR 99 | Temp 98.5°F | Ht 68.0 in | Wt 369.0 lb

## 2022-08-11 DIAGNOSIS — F3289 Other specified depressive episodes: Secondary | ICD-10-CM | POA: Diagnosis not present

## 2022-08-11 DIAGNOSIS — E559 Vitamin D deficiency, unspecified: Secondary | ICD-10-CM

## 2022-08-11 DIAGNOSIS — E88819 Insulin resistance, unspecified: Secondary | ICD-10-CM | POA: Diagnosis not present

## 2022-08-11 DIAGNOSIS — K219 Gastro-esophageal reflux disease without esophagitis: Secondary | ICD-10-CM | POA: Diagnosis not present

## 2022-08-11 DIAGNOSIS — Z6841 Body Mass Index (BMI) 40.0 and over, adult: Secondary | ICD-10-CM

## 2022-08-11 DIAGNOSIS — E669 Obesity, unspecified: Secondary | ICD-10-CM

## 2022-08-11 MED ORDER — VITAMIN D (ERGOCALCIFEROL) 1.25 MG (50000 UNIT) PO CAPS: 50000.0000 [IU] | ORAL_CAPSULE | ORAL | 0 refills | Status: DC

## 2022-08-11 MED ORDER — OZEMPIC (0.25 OR 0.5 MG/DOSE) 2 MG/1.5ML ~~LOC~~ SOPN: 0.5000 mg | PEN_INJECTOR | SUBCUTANEOUS | 0 refills | Status: DC

## 2022-08-11 MED ORDER — PANTOPRAZOLE SODIUM 40 MG PO TBEC: DELAYED_RELEASE_TABLET | ORAL | 0 refills | Status: DC

## 2022-08-11 MED ORDER — ESCITALOPRAM OXALATE 10 MG PO TABS: 10.0000 mg | ORAL_TABLET | Freq: Every day | ORAL | 0 refills | Status: DC

## 2022-08-12 NOTE — Progress Notes (Signed)
Chief Complaint:   OBESITY Ashley Acevedo is here to discuss her progress with her obesity treatment plan along with follow-up of her obesity related diagnoses. Ashley Acevedo is on the Category 2 Plan and states she is following her eating plan approximately 20% of the time. Ashley Acevedo states she is walking 30-40 minutes 3 times per week.  Today's visit was #: 67 Starting weight: 295 lbs Starting date: 08/24/2019 Today's weight: 369 lbs Today's date: 08/11/2022 Total lbs lost to date: 0 Total lbs lost since last in-office visit: +4 lbs  Interim History:  She endorses frequent travel out of town and hosting out of town guests for >3 weeks.   She has been challenged to eat on plan.   Subjective:   1. Vitamin D deficiency 03/04/2022, Vitamin D level 27.1. She has been off Ergcocalciferol for several weeks.  2. Insulin resistance She was previously on Mounjaro and tolerated well.   She is currently on Ozempic and has missed 3 weeks, so restarted decreased dose of Ozempic 0.25 yesterday.   3. Gastroesophageal reflux disease, unspecified whether esophagitis present Reflux well controlled with pantoprazole (PROTONIX) 40 MG tablet; TAKE 1 TABLET(40 MG).  4. Other depression, with emotional eating Mood is stable, she denies any suicidal or homicidal ideations.   Assessment/Plan:   1. Vitamin D deficiency Check labs at next office visit.  Refill - Vitamin D, Ergocalciferol, (DRISDOL) 1.25 MG (50000 UNIT) CAPS capsule; Take 1 capsule (50,000 Units total) by mouth every 3 (three) days. Take 1 cap every 3 days  Dispense: 12 capsule; Refill: 0  2. Insulin resistance One more week on Ozempic 0.25 mg, then increase Ozempic to 0.5 mg.   Refill - Semaglutide,0.25 or 0.5MG /DOS, (OZEMPIC, 0.25 OR 0.5 MG/DOSE,) 2 MG/1.5ML SOPN; Inject 0.5 mg into the skin once a week.  Dispense: 2 mL; Refill: 0  3. Gastroesophageal reflux disease, unspecified whether esophagitis present Continue to avoid "trigger  foods". Refill - pantoprazole (PROTONIX) 40 MG tablet; TAKE 1 TABLET(40 MG) BY MOUTH DAILY  Dispense: 90 tablet; Refill: 0  4. Other depression, with emotional eating Refill - escitalopram (LEXAPRO) 10 MG tablet; Take 1 tablet (10 mg total) by mouth daily.  Dispense: 90 tablet; Refill: 0  5. Obesity, current BMI 56.2 Check fasting labs at next office visit.   Ashley Acevedo is currently in the action stage of change. As such, her goal is to continue with weight loss efforts. She has agreed to the Category 2 Plan.   Exercise goals:  As is.   Behavioral modification strategies: increasing lean protein intake, decreasing simple carbohydrates, meal planning and cooking strategies, keeping healthy foods in the home, and planning for success.  Ashley Acevedo has agreed to follow-up with our clinic in 4 weeks. She was informed of the importance of frequent follow-up visits to maximize her success with intensive lifestyle modifications for her multiple health conditions.   Objective:   Blood pressure 127/73, pulse 99, temperature 98.5 F (36.9 C), height 5\' 8"  (1.727 m), weight (!) 369 lb (167.4 kg), SpO2 95 %. Body mass index is 56.11 kg/m.  General: Cooperative, alert, well developed, in no acute distress. HEENT: Conjunctivae and lids unremarkable. Cardiovascular: Regular rhythm.  Lungs: Normal work of breathing. Neurologic: No focal deficits.   Lab Results  Component Value Date   CREATININE 0.65 03/04/2022   BUN 10 03/04/2022   NA 140 03/04/2022   K 4.6 03/04/2022   CL 104 03/04/2022   CO2 23 03/04/2022   Lab Results  Component  Value Date   ALT 32 03/04/2022   AST 22 03/04/2022   ALKPHOS 107 03/04/2022   BILITOT 0.2 03/04/2022   Lab Results  Component Value Date   HGBA1C 5.5 03/04/2022   HGBA1C 5.6 08/15/2021   HGBA1C 5.2 12/20/2020   HGBA1C 5.3 07/25/2020   HGBA1C 5.1 04/11/2020   Lab Results  Component Value Date   INSULIN 17.7 03/04/2022   INSULIN 103.0 (H) 08/15/2021   INSULIN  19.7 12/20/2020   INSULIN 26.0 (H) 07/25/2020   INSULIN 26.0 (H) 04/11/2020   No results found for: "TSH" Lab Results  Component Value Date   CHOL 183 03/04/2022   HDL 53 03/04/2022   LDLCALC 108 (H) 03/04/2022   TRIG 123 03/04/2022   CHOLHDL 3.5 03/04/2022   Lab Results  Component Value Date   VD25OH 27.1 (L) 03/04/2022   VD25OH 24.9 (L) 08/15/2021   VD25OH 26.6 (L) 12/20/2020   Lab Results  Component Value Date   WBC 8.1 12/20/2020   HGB 12.8 12/20/2020   HCT 39.4 12/20/2020   MCV 85 12/20/2020   PLT 350 12/20/2020   Lab Results  Component Value Date   IRON 76 12/20/2020   TIBC 416 12/20/2020   FERRITIN 23 12/20/2020    Attestation Statements:   Reviewed by clinician on day of visit: allergies, medications, problem list, medical history, surgical history, family history, social history, and previous encounter notes.  I, Davy Pique, RMA, am acting as Location manager for Mina Marble, NP.  I have reviewed the above documentation for accuracy and completeness, and I agree with the above. -  Chriss Redel d. Gulianna Hornsby, NP-C

## 2022-09-17 ENCOUNTER — Other Ambulatory Visit (INDEPENDENT_AMBULATORY_CARE_PROVIDER_SITE_OTHER): Payer: Self-pay | Admitting: Adult Health

## 2022-09-17 ENCOUNTER — Ambulatory Visit (INDEPENDENT_AMBULATORY_CARE_PROVIDER_SITE_OTHER): Payer: 59 | Admitting: Family Medicine

## 2022-09-17 DIAGNOSIS — E559 Vitamin D deficiency, unspecified: Secondary | ICD-10-CM

## 2022-09-25 ENCOUNTER — Encounter: Payer: Self-pay | Admitting: *Deleted

## 2022-10-01 ENCOUNTER — Encounter: Payer: Self-pay | Admitting: Neurology

## 2022-10-01 ENCOUNTER — Ambulatory Visit: Payer: 59 | Admitting: Neurology

## 2022-10-01 VITALS — BP 140/90 | HR 91 | Ht 68.0 in | Wt 376.8 lb

## 2022-10-01 DIAGNOSIS — H538 Other visual disturbances: Secondary | ICD-10-CM

## 2022-10-01 DIAGNOSIS — G4484 Primary exertional headache: Secondary | ICD-10-CM | POA: Diagnosis not present

## 2022-10-01 DIAGNOSIS — G8929 Other chronic pain: Secondary | ICD-10-CM

## 2022-10-01 DIAGNOSIS — Z8669 Personal history of other diseases of the nervous system and sense organs: Secondary | ICD-10-CM | POA: Diagnosis not present

## 2022-10-01 DIAGNOSIS — G4483 Primary cough headache: Secondary | ICD-10-CM

## 2022-10-01 DIAGNOSIS — H4710 Unspecified papilledema: Secondary | ICD-10-CM

## 2022-10-01 DIAGNOSIS — M542 Cervicalgia: Secondary | ICD-10-CM | POA: Diagnosis not present

## 2022-10-01 DIAGNOSIS — G441 Vascular headache, not elsewhere classified: Secondary | ICD-10-CM | POA: Diagnosis not present

## 2022-10-01 DIAGNOSIS — R519 Headache, unspecified: Secondary | ICD-10-CM

## 2022-10-01 NOTE — Progress Notes (Signed)
[facility]    Provider:  Dr Lucia GaskinsAhern Requesting Provider: Sherian ReinBovard-Stuckert, Jody, * Primary Care Provider:  Jarold MottoWorley, Samantha, PA  CC:  Fhx of chiari malformation, headaches  HPI:  Ashley Acevedo is a 26 y.o. female here as requested by Bovard-Stuckert, Jody, * for Fhx of chiari malformation.  I reviewed Dr. Ellyn HackBovard Stuckert's notes which show she has a history of Chiari malformation and her mother, she has anxiety, asthma since a child otherwise seems healthy, review of systems showed that she is morbidly obese body mass 50-59 in adult otherwise examination was unremarkable and Pap smear was negative for intraepithelial lesion or malignancy.  Dr. Ellyn HackBovard discussed because she has a family history in mother and grandmother of Chiari malformation that before patient has kids that she should be tested, this is something is starting to think about.  Both mother and grandmother had chiari malformation. They did not have surgery.She has occasional headache in the back of the head moderate pain can last briefly while exerting herself with coughing then goes away, it can get worse with coughing, straining, may have occ neck pain, no dizziness or balance problems, occ blurry vision, no muscle weakness, no numbness or tingling in the arms or legs, has headaches several times a months and triggered by coughing/valsalva. No blurred vision or double vision, no swallowing problems, no hearing loss or ringing in the ears, no problems with hands or difficulty walking or problems with bowel or bladder control. No other focal neurologic deficits, associated symptoms, inciting events or modifiable factors.   Reviewed notes, labs and imaging from outside physicians, which showed:  03/04/2022: Hgba1c 5.5     Latest Ref Rng & Units 03/04/2022   11:13 AM 08/15/2021    8:31 AM 12/20/2020   10:12 AM  CMP  Glucose 70 - 99 mg/dL 77  97  76   BUN 6 - 20 mg/dL 10  13  13    Creatinine 0.57 - 1.00 mg/dL 6.040.65   5.400.70  9.810.69   Sodium 134 - 144 mmol/L 140  140  138   Potassium 3.5 - 5.2 mmol/L 4.6  4.5  4.5   Chloride 96 - 106 mmol/L 104  102  101   CO2 20 - 29 mmol/L 23  23  20    Calcium 8.7 - 10.2 mg/dL 9.0  9.8  9.6   Total Protein 6.0 - 8.5 g/dL 6.6  7.0  6.9   Total Bilirubin 0.0 - 1.2 mg/dL 0.2  <1.9<0.2  0.3   Alkaline Phos 44 - 121 IU/L 107  112  94   AST 0 - 40 IU/L 22  19  12    ALT 0 - 32 IU/L 32  28  13       Latest Ref Rng & Units 12/20/2020   10:12 AM 04/11/2020    1:25 PM 01/02/2020   10:12 AM  CBC  WBC 3.4 - 10.8 x10E3/uL 8.1  7.8  6.4   Hemoglobin 11.1 - 15.9 g/dL 14.712.8  82.912.3  56.212.4   Hematocrit 34.0 - 46.6 % 39.4  38.2  39.7   Platelets 150 - 450 x10E3/uL 350  300  327     Review of Systems: Patient complains of symptoms per HPI as well as the following symptoms headache. Pertinent negatives and positives per HPI. All others negative.   Social History   Socioeconomic History   Marital status: Married    Spouse name: Not on file   Number of children: Not on  file   Years of education: Not on file   Highest education level: Not on file  Occupational History   Not on file  Tobacco Use   Smoking status: Never   Smokeless tobacco: Never  Vaping Use   Vaping Use: Never used  Substance and Sexual Activity   Alcohol use: Yes    Comment: socially   Drug use: No   Sexual activity: Yes    Birth control/protection: Pill  Other Topics Concern   Not on file  Social History Narrative   Biology degree --> wants to do research   Works at a [employer]    No children or pets   Social Determinants of Corporate investment banker Strain: Not on file  Food Insecurity: Not on file  Transportation Needs: Not on file  Physical Activity: Not on file  Stress: Not on file  Social Connections: Not on file  Intimate Partner Violence: Not on file    Family History  Problem Relation Age of Onset   Asthma Mother    Obesity Mother    Chiari malformation  Mother    Hyperlipidemia Father    Atrial fibrillation Father        had ablation and is now in rhythm   Asthma Maternal Grandmother    Stomach cancer Maternal Grandmother    Chiari malformation Maternal Grandmother    Lung cancer Maternal Grandfather    Prostate cancer Maternal Grandfather    Colon cancer Maternal Grandfather        late 60's/early 70's   Alzheimer's disease Maternal Grandfather    Brain cancer Paternal Grandmother    Lung cancer Paternal Grandfather    Allergic rhinitis Neg Hx    Angioedema Neg Hx    Eczema Neg Hx    Immunodeficiency Neg Hx    Urticaria Neg Hx     Past Medical History:  Diagnosis Date   Allergies    Anxiety    Asthma    Back pain    GERD (gastroesophageal reflux disease)    Heartburn    History of stomach ulcers    Hyperlipidemia    Joint pain     Patient Active Problem List   Diagnosis Date Noted   At risk for osteoporosis 04/01/2022   Vitamin B12 deficiency 03/11/2022   Nausea in adult 11/27/2021   Moderate persistent asthma without complication 06/21/2021   Pollen-food allergy 06/21/2021   Gastroesophageal reflux disease 06/21/2021   Fatigue 12/20/2020   Mixed hyperlipidemia 06/12/2020   B12 nutritional deficiency 01/23/2020   Insulin resistance 01/23/2020   Vitamin D deficiency 01/23/2020   Depression 01/23/2020   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/2020   Class 3 severe obesity with serious comorbidity and body mass index (BMI) of 50.0 to 59.9 in adult (HCC) 01/23/2020   Mild persistent asthma without complication 11/02/2017   Seasonal and perennial allergic rhinitis 11/02/2017   Seasonal allergic conjunctivitis 11/02/2017   Anxiety 04/10/2016    Past Surgical History:  Procedure Laterality Date   WISDOM TOOTH EXTRACTION      Current Outpatient Medications  Medication Sig Dispense Refill   albuterol (VENTOLIN HFA) 108 (90 Base) MCG/ACT inhaler Inhale 2 puffs into the lungs every 4 (four) hours as needed for wheezing or  shortness of breath. 18 g 1   cetirizine (ZYRTEC) 10 MG tablet Take 10 mg by mouth daily.     drospirenone-ethinyl estradiol (YAZ,GIANVI,LORYNA) 3-0.02 MG tablet Take 1 tablet by mouth daily.  escitalopram (LEXAPRO) 10 MG tablet Take 1 tablet (10 mg total) by mouth daily. 90 tablet 0   fluticasone (FLONASE) 50 MCG/ACT nasal spray Place 1 spray into both nostrils daily.     pantoprazole (PROTONIX) 40 MG tablet TAKE 1 TABLET(40 MG) BY MOUTH DAILY 90 tablet 0   Semaglutide,0.25 or 0.5MG /DOS, (OZEMPIC, 0.25 OR 0.5 MG/DOSE,) 2 MG/1.5ML SOPN Inject 0.5 mg into the skin once a week. 2 mL 0   Vitamin D, Ergocalciferol, (DRISDOL) 1.25 MG (50000 UNIT) CAPS capsule Take 1 capsule (50,000 Units total) by mouth every 3 (three) days. Take 1 cap every 3 days 12 capsule 0   No current facility-administered medications for this visit.    Allergies as of 10/01/2022 - Review Complete 10/01/2022  Allergen Reaction Noted   Clindamycin Rash 11/17/2018   Codeine Rash 04/10/2016   Hydrocodone Rash 11/02/2017    Vitals: BP (!) 140/90 Comment: manual  Pulse 91   Ht 5\' 8"  (1.727 m)   Wt (!) 376 lb 12.8 oz (170.9 kg)   BMI 57.29 kg/m  Last Weight:  Wt Readings from Last 1 Encounters:  10/01/22 (!) 376 lb 12.8 oz (170.9 kg)   Last Height:   Ht Readings from Last 1 Encounters:  10/01/22 5\' 8"  (1.727 m)     Physical exam: Exam: Gen: NAD, conversant, well nourised, obese, well groomed                     CV: RRR, no MRG. No Carotid Bruits. No peripheral edema, warm, nontender Eyes: Conjunctivae clear without exudates or hemorrhage  Neuro: Detailed Neurologic Exam  Speech:    Speech is normal; fluent and spontaneous with normal comprehension.  Cognition:    The patient is oriented to person, place, and time;     recent and remote memory intact;     language fluent;     normal attention, concentration,     fund of knowledge Cranial Nerves:    The pupils are equal, round, and reactive to light.  Mild blurriness of the margins of the optic nerves.  Visual fields are full to finger confrontation. Extraocular movements are intact. Trigeminal sensation is intact and the muscles of mastication are normal. The face is symmetric. The palate elevates in the midline. Hearing intact. Voice is normal. Shoulder shrug is normal. The tongue has normal motion without fasciculations.   Coordination: nml  Gait: Wide based due to large body habitus  Motor Observation:    No asymmetry, no atrophy, and no involuntary movements noted. Tone:    Normal muscle tone.    Posture:    Posture is normal. normal erect    Strength:    Strength is V/V in the upper and lower limbs.      Sensation: intact to LT     Reflex Exam:  DTR's:    Deep tendon reflexes in the upper and lower extremities are normal bilaterally.   Toes:    The toes are downgoing bilaterally.   Clonus:    Clonus is absent.    Assessment/Plan: This is a 26 year old female with a family history of Chiari malformation in her mother and her grandmother.  She is morbidly obese.  She is worried about getting pregnant (currently not pregnant) with a Chiari malformation.  She does have headaches in the back of her head, neck pain, occipital headaches on coughing and Valsalva, occasional blurry vision, her funduscopic exam looked relatively normal with some blurring of the margins cannot rule  out papilledema.  We need imaging for this patient.  MRI brain due to concerning symptoms of morning headaches,  exertional headaches,vision changes, occipital headache, neck pain, possibly papilledema to look for space occupying mass, chiari or intracranial hypertension (pseudotumor), strokes, malignancies, vasculidities, demyelination(multiple sclerosis) or other   Orders Placed This Encounter  Procedures   MR BRAIN W WO CONTRAST   Basic Metabolic Panel   TSH Rfx on Abnormal to Free T4   No orders of the defined types were placed in this  encounter.  Discussed: To prevent or relieve headaches, try the following: Cool Compress. Lie down and place a cool compress on your head.  Avoid headache triggers. If certain foods or odors seem to have triggered your migraines in the past, avoid them. A headache diary might help you identify triggers.  Include physical activity in your daily routine. Try a daily walk or other moderate aerobic exercise.  Manage stress. Find healthy ways to cope with the stressors, such as delegating tasks on your to-do list.  Practice relaxation techniques. Try deep breathing, yoga, massage and visualization.  Eat regularly. Eating regularly scheduled meals and maintaining a healthy diet might help prevent headaches. Also, drink plenty of fluids.  Follow a regular sleep schedule. Sleep deprivation might contribute to headaches Consider biofeedback. With this mind-body technique, you learn to control certain bodily functions -- such as muscle tension, heart rate and blood pressure -- to prevent headaches or reduce headache pain.    Proceed to emergency room if you experience new or worsening symptoms or symptoms do not resolve, if you have new neurologic symptoms or if headache is severe, or for any concerning symptom.   Provided education and documentation on chiari in the AVS   Cc: Bovard-Stuckert, Jody, *,  Jarold Motto, [place]  Naomie Dean, MD  [facility] [address] [place], [place] [address]  Phone [phone] Fax [phone]

## 2022-10-01 NOTE — Patient Instructions (Addendum)
Blood work MRI of the brain w/wo contrast  Chiari Malformation  Chiari malformation (CM) is a type of brain abnormality that affects the parts of the brain called the cerebellum and the brain stem. The cerebellum is important for balance, and the brain stem is important for basic body functions, such as breathing and swallowing. Normally, the cerebellum is located in a space at the back of the skull, just above the opening in the skull (foramen magnum)where the spinal cord meets the brain stem. With CM, part of the cerebellum is located below the foramen magnum instead. The malformation can be mild with no or few symptoms, or it can be severe. CM can cause neck pain, headaches, balance problems, and other symptoms. What are the causes? CM is a condition that a person is born with (congenital). In rare cases, CM may also develop later in life, which is called acquired CM or secondary CM. These cases may be caused by a leak of the fluid that surrounds and protects the brain and spinal cord (cerebrospinal fluid), leading to low pressure. In acquired or secondary CM, abnormal pressure develops in the brain. This pushes the cerebellum down into the foramen magnum. What increases the risk? The following factors may make you more likely to develop this condition: Being female. Having a family history of CM. What are the signs or symptoms? Symptoms of this condition may vary depending on the severity of your CM. In some cases, there are no symptoms. In other cases, symptoms may come and go. The most common symptom is a severe headache in the back of the head. The headache: May come and go. May spread to your neck and shoulders. May be worse when you cough, sneeze, or strain. Other symptoms include: Difficulty balancing or loss of coordination. Vision problems, such as double vision, tiny spots moving across your vision (floaters), or sensitivity to lights. Trouble swallowing or speaking or  hearing. Feeling dizzy or fainting. Breathing problems, including breathing pauses during sleep (sleep apnea). Curved back (scoliosis). Inability to control when you urinate (incontinence). How is this diagnosed? This condition may be evaluated with a medical history and physical exam. This may include tests to check your balance and nerves (neurological exam). You may also have imaging tests, such as a CT scan or an MRI. How is this treated? Treatment for this condition depends on the severity of your symptoms. You may be treated with: Surgery to prevent the malformation from getting worse, or to treat severe symptoms or symptoms that are getting worse. Medicines or alternative treatments to relieve headaches or neck pain. If you do not have symptoms, you may not need treatment. Follow these instructions at home: Medicines Take over-the-counter and prescription medicines only as told by your health care provider. Ask your health care provider if the medicine prescribed to you requires you to avoid driving or using machinery. General instructions If you feel like you might faint: Lie down right away and raise (elevate) your feet above the level of your heart. Breathe deeply and steadily. Wait until all of the symptoms have passed. If you have problems with dizziness, get up slowly when lying down. Take several minutes to sit and then stand. Ask your health care provider which activities are safe for you and if you have any activity restrictions. Do not use any products that contain nicotine or tobacco. These products include cigarettes, chewing tobacco, and vaping devices, such as e-cigarettes. If you need help quitting, ask your health care provider.  Drink enough fluid to keep your urine pale yellow. Consider joining a CM support group. Keep all follow-up visits. This is important. Where to find more information [employer] of Neurological Disorders and Stroke:  MasterBox[employer]it Contact a health care provider if: You have new symptoms. Your symptoms get worse. Get help right away if: You develop weakness or numbness in one or more of your limbs. You develop dizziness, slurred speech, double vision, weakness, or numbness with a severe headache. These symptoms may represent a serious problem that is an emergency. Do not wait to see if the symptoms will go away. Get medical help right away. Call your local emergency services (911 in the [place] Summary A Chiari malformation is a condition in which part of the cerebellum moves down through the foramen magnum. The malformation can be mild with no symptoms, or it can be severe. In some cases, no treatment is needed. In others, medicines are used to treat headaches. Surgery is done in the worst of cases. This information is not intended to replace advice given to you by your health care provider. Make sure you discuss any questions you have with your health care provider. Document Revised: 12/25/2020 Document Reviewed: 12/25/2020 Elsevier Patient Education  [place].

## 2022-10-02 ENCOUNTER — Telehealth: Payer: Self-pay | Admitting: Neurology

## 2022-10-02 LAB — BASIC METABOLIC PANEL
BUN/Creatinine Ratio: 17 (ref 9–23)
BUN: 12 mg/dL (ref 6–20)
CO2: 23 mmol/L (ref 20–29)
Calcium: 9.1 mg/dL (ref 8.7–10.2)
Chloride: 103 mmol/L (ref 96–106)
Creatinine, Ser: 0.7 mg/dL (ref 0.57–1.00)
Glucose: 87 mg/dL (ref 70–99)
Potassium: 4.5 mmol/L (ref 3.5–5.2)
Sodium: 138 mmol/L (ref 134–144)
eGFR: 121 mL/min/{1.73_m2} (ref >59)

## 2022-10-02 LAB — TSH RFX ON ABNORMAL TO FREE T4: TSH: 2.72 u[IU]/mL (ref 0.450–4.500)

## 2022-10-02 NOTE — Telephone Encounter (Signed)
[facility] NPR Case Number: [id number] sent to GI [id number]

## 2022-10-03 ENCOUNTER — Encounter: Payer: Self-pay | Admitting: Neurology

## 2022-10-30 ENCOUNTER — Ambulatory Visit
Admission: RE | Admit: 2022-10-30 | Discharge: 2022-10-30 | Disposition: A | Discharge status: Home / Self Care | Payer: 59 | Source: Ambulatory Visit | Attending: Neurology | Admitting: Neurology

## 2022-10-30 DIAGNOSIS — G441 Vascular headache, not elsewhere classified: Secondary | ICD-10-CM

## 2022-10-30 DIAGNOSIS — G4483 Primary cough headache: Secondary | ICD-10-CM

## 2022-10-30 DIAGNOSIS — Z8669 Personal history of other diseases of the nervous system and sense organs: Secondary | ICD-10-CM

## 2022-10-30 DIAGNOSIS — H538 Other visual disturbances: Secondary | ICD-10-CM

## 2022-10-30 DIAGNOSIS — H4710 Unspecified papilledema: Secondary | ICD-10-CM

## 2022-10-30 DIAGNOSIS — G4484 Primary exertional headache: Secondary | ICD-10-CM

## 2022-10-30 DIAGNOSIS — R519 Headache, unspecified: Secondary | ICD-10-CM

## 2022-10-30 MED ORDER — GADOPICLENOL 0.5 MMOL/ML IV SOLN
10.0000 mL | Freq: Once | INTRAVENOUS | Status: AC | PRN
  Administered 2022-10-30: 10 mL via INTRAVENOUS

## 2022-11-03 ENCOUNTER — Encounter: Payer: Self-pay | Admitting: Neurology

## 2022-11-03 ENCOUNTER — Telehealth: Payer: Self-pay | Admitting: Neurology

## 2022-11-03 NOTE — Telephone Encounter (Signed)
Can you get her an appt with me or an NP sometime in the next 4-6 weeks please? Let me know if it is an NP so I can talk to them prior thanks

## 2022-11-04 NOTE — Telephone Encounter (Signed)
Pt scheduled for follow up with Dr. Jaynee Eagles for 12/02/22 at 1:00pm

## 2022-11-04 NOTE — Telephone Encounter (Signed)
Sent mychart to inform pt

## 2022-11-05 ENCOUNTER — Ambulatory Visit (INDEPENDENT_AMBULATORY_CARE_PROVIDER_SITE_OTHER): Payer: 59 | Admitting: Adult Health

## 2022-11-05 ENCOUNTER — Other Ambulatory Visit (INDEPENDENT_AMBULATORY_CARE_PROVIDER_SITE_OTHER): Payer: Self-pay | Admitting: Adult Health

## 2022-11-05 ENCOUNTER — Encounter (INDEPENDENT_AMBULATORY_CARE_PROVIDER_SITE_OTHER): Payer: Self-pay | Admitting: Adult Health

## 2022-11-05 VITALS — BP 134/78 | HR 107 | Temp 99.2°F | Ht 68.0 in | Wt 371.0 lb

## 2022-11-05 DIAGNOSIS — Z6841 Body Mass Index (BMI) 40.0 and over, adult: Secondary | ICD-10-CM

## 2022-11-05 DIAGNOSIS — E669 Obesity, unspecified: Secondary | ICD-10-CM

## 2022-11-05 DIAGNOSIS — E88819 Insulin resistance, unspecified: Secondary | ICD-10-CM

## 2022-11-05 DIAGNOSIS — F3289 Other specified depressive episodes: Secondary | ICD-10-CM

## 2022-11-05 DIAGNOSIS — K219 Gastro-esophageal reflux disease without esophagitis: Secondary | ICD-10-CM | POA: Diagnosis not present

## 2022-11-05 DIAGNOSIS — E559 Vitamin D deficiency, unspecified: Secondary | ICD-10-CM

## 2022-11-05 MED ORDER — PANTOPRAZOLE SODIUM 40 MG PO TBEC: DELAYED_RELEASE_TABLET | ORAL | 0 refills | Status: DC

## 2022-11-05 MED ORDER — OZEMPIC (0.25 OR 0.5 MG/DOSE) 2 MG/1.5ML ~~LOC~~ SOPN: 1.0000 mg | PEN_INJECTOR | SUBCUTANEOUS | 0 refills | Status: DC

## 2022-11-05 MED ORDER — ESCITALOPRAM OXALATE 10 MG PO TABS: 10.0000 mg | ORAL_TABLET | Freq: Every day | ORAL | 0 refills | Status: DC

## 2022-11-06 ENCOUNTER — Encounter (INDEPENDENT_AMBULATORY_CARE_PROVIDER_SITE_OTHER): Payer: Self-pay | Admitting: Adult Health

## 2022-11-06 ENCOUNTER — Other Ambulatory Visit (INDEPENDENT_AMBULATORY_CARE_PROVIDER_SITE_OTHER): Payer: Self-pay | Admitting: Adult Health

## 2022-11-06 DIAGNOSIS — E559 Vitamin D deficiency, unspecified: Secondary | ICD-10-CM

## 2022-11-06 DIAGNOSIS — E88819 Insulin resistance, unspecified: Secondary | ICD-10-CM

## 2022-11-06 LAB — COMPREHENSIVE METABOLIC PANEL
ALT: 29 IU/L (ref 0–32)
AST: 18 IU/L (ref 0–40)
Albumin/Globulin Ratio: 1.4 (ref 1.2–2.2)
Albumin: 4.1 g/dL (ref 4.0–5.0)
Alkaline Phosphatase: 112 IU/L (ref 44–121)
BUN/Creatinine Ratio: 18 (ref 9–23)
BUN: 12 mg/dL (ref 6–20)
Bilirubin Total: 0.3 mg/dL (ref 0.0–1.2)
CO2: 23 mmol/L (ref 20–29)
Calcium: 9.1 mg/dL (ref 8.7–10.2)
Chloride: 104 mmol/L (ref 96–106)
Creatinine, Ser: 0.67 mg/dL (ref 0.57–1.00)
Globulin, Total: 3 g/dL (ref 1.5–4.5)
Glucose: 92 mg/dL (ref 70–99)
Potassium: 4.6 mmol/L (ref 3.5–5.2)
Sodium: 141 mmol/L (ref 134–144)
Total Protein: 7.1 g/dL (ref 6.0–8.5)
eGFR: 122 mL/min/{1.73_m2} (ref >59)

## 2022-11-06 LAB — INSULIN, RANDOM: INSULIN: 39 u[IU]/mL — ABNORMAL HIGH (ref 2.6–24.9)

## 2022-11-06 LAB — HEMOGLOBIN A1C
Est. average glucose Bld gHb Est-mCnc: 117 mg/dL
Hgb A1c MFr Bld: 5.7 % — ABNORMAL HIGH (ref 4.8–5.6)

## 2022-11-06 LAB — VITAMIN D 25 HYDROXY (VIT D DEFICIENCY, FRACTURES): Vit D, 25-Hydroxy: 17.7 ng/mL — ABNORMAL LOW (ref 30.0–100.0)

## 2022-11-06 MED ORDER — VITAMIN D (ERGOCALCIFEROL) 1.25 MG (50000 UNIT) PO CAPS: 50000.0000 [IU] | ORAL_CAPSULE | ORAL | 0 refills | Status: DC

## 2022-11-06 MED ORDER — SEMAGLUTIDE (1 MG/DOSE) 4 MG/3ML ~~LOC~~ SOPN: 1.0000 mg | PEN_INJECTOR | SUBCUTANEOUS | 0 refills | Status: DC

## 2022-11-18 NOTE — Progress Notes (Addendum)
Chief Complaint:   OBESITY Ashley Acevedo is here to discuss her progress with her obesity treatment plan along with follow-up of her obesity related diagnoses. Ashley Acevedo is on the Category 2 Plan and states she is following her eating plan approximately 40% of the time. Ashley Acevedo states she is walking 45 minutes 2 times per week.  Today's visit was #: 33 Starting weight: 295 lbs Starting date: 08/24/2019 Today's weight: 371 lbs Today's date: 11/05/2022 Total lbs lost to date: 0 lbs Total lbs lost since last in-office visit: +2 lbs  Interim History:  Ashley Acevedo was COVID-19 + the last week of Dec 2023.  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  She is asymptotic.  Her mother also has Chiari Malformation.  Of Note- Ashley Acevedo oral birth control.  Subjective:   1. Vitamin D deficiency Currently on weekly ergocalciferol.   Patient denies nausea, vomiting, muscle weakness.   2. Insulin resistance Insulin level fluctuates between 17.7-103.0   Has been on Ozempic 0.5 mg since spring 2023.   She denies mass in neck, dysphagia, dyspepsia, persistent hoarseness, abdominal pain, or N/V/Constipation. Mounjaro tolerated well.   Victoza, ineffective and caused significant bruising.  Of Note- Ashley Acevedo oral birth control.  3. Gastroesophageal reflux disease, unspecified whether esophagitis present Acid reflux well controlled with daily Protonix 40 mg.   4. Other depression, with emotional eating Patient reports stable mood.   She denies suicidal or homicidal ideations.  Assessment/Plan:   1. Vitamin D deficiency Check labs today. Check labs then my chart patient with results and refill ergocalciferol as appropriate.   - VITAMIN D 25 Hydroxy (Vit-D Deficiency, Fractures)  2. Insulin resistance Refill Ozempic 1 mg once weekly, disp 3 mL, no refills.  Check labs today.   - Comprehensive metabolic panel - Hemoglobin A1c - Insulin, random  3. Gastroesophageal reflux disease, unspecified  whether esophagitis present Refill - pantoprazole (PROTONIX) 40 MG tablet; TAKE 1 TABLET(40 MG) BY MOUTH DAILY  Dispense: 90 tablet; Refill: 0  4. Other depression, with emotional eating Refill - escitalopram (LEXAPRO) 10 MG tablet; Take 1 tablet (10 mg total) by mouth daily.  Dispense: 90 tablet; Refill: 0  5. Obesity, current BMI 56.5 Ashley Acevedo is currently in the action stage of change. As such, her goal is to continue with weight loss efforts. She has agreed to keeping a food journal and adhering to recommended goals of 1200 calories and 85 protein daily.   Exercise goals:  As is.   Behavioral modification strategies: increasing lean protein intake, decreasing simple carbohydrates, meal planning and cooking strategies, keeping healthy foods in the home, and planning for success.  Ashley Acevedo has agreed to follow-up with our clinic in 3-4 weeks. She was informed of the importance of frequent follow-up visits to maximize her success with intensive lifestyle modifications for her multiple health conditions.   Objective:   Blood pressure 134/78, pulse (!) 107, temperature 99.2 F (37.3 C), height 5' 8"$  (1.727 m), SpO2 96 %. Body mass index is 57.29 kg/m.  General: Cooperative, alert, well developed, in no acute distress. HEENT: Conjunctivae and lids unremarkable. Cardiovascular: Regular rhythm.  Lungs: Normal work of breathing. Neurologic: No focal deficits.   Lab Results  Component Value Date   CREATININE 0.67 11/05/2022   BUN 12 11/05/2022   NA 141 11/05/2022   K 4.6 11/05/2022   CL 104 11/05/2022   CO2 23 11/05/2022   Lab Results  Component Value Date   ALT 29 11/05/2022   AST  18 11/05/2022   ALKPHOS 112 11/05/2022   BILITOT 0.3 11/05/2022   Lab Results  Component Value Date   HGBA1C 5.7 (H) 11/05/2022   HGBA1C 5.5 03/04/2022   HGBA1C 5.6 08/15/2021   HGBA1C 5.2 12/20/2020   HGBA1C 5.3 07/25/2020   Lab Results  Component Value Date   INSULIN 39.0 (H) 11/05/2022    INSULIN 17.7 03/04/2022   INSULIN 103.0 (H) 08/15/2021   INSULIN 19.7 12/20/2020   INSULIN 26.0 (H) 07/25/2020   Lab Results  Component Value Date   TSH 2.720 10/01/2022   Lab Results  Component Value Date   CHOL 183 03/04/2022   HDL 53 03/04/2022   LDLCALC 108 (H) 03/04/2022   TRIG 123 03/04/2022   CHOLHDL 3.5 03/04/2022   Lab Results  Component Value Date   VD25OH 17.7 (L) 11/05/2022   VD25OH 27.1 (L) 03/04/2022   VD25OH 24.9 (L) 08/15/2021   Lab Results  Component Value Date   WBC 8.1 12/20/2020   HGB 12.8 12/20/2020   HCT 39.4 12/20/2020   MCV 85 12/20/2020   PLT 350 12/20/2020   Lab Results  Component Value Date   IRON 76 12/20/2020   TIBC 416 12/20/2020   FERRITIN 23 12/20/2020   Attestation Statements:   Reviewed by clinician on day of visit: allergies, medications, problem list, medical history, surgical history, family history, social history, and previous encounter notes.  I, Davy Pique, RMA, am acting as Location manager for Mina Marble, NP.  I have reviewed the above documentation for accuracy and completeness, and I agree with the above. -  Florine Sprenkle d. Arvie Bartholomew, NP-C

## 2022-11-24 ENCOUNTER — Encounter (INDEPENDENT_AMBULATORY_CARE_PROVIDER_SITE_OTHER): Payer: Self-pay | Admitting: Adult Health

## 2022-11-24 ENCOUNTER — Ambulatory Visit (INDEPENDENT_AMBULATORY_CARE_PROVIDER_SITE_OTHER): Payer: 59 | Admitting: Adult Health

## 2022-11-24 VITALS — BP 122/80 | HR 115 | Temp 98.2°F | Ht 68.0 in | Wt 367.0 lb

## 2022-11-24 DIAGNOSIS — E88819 Insulin resistance, unspecified: Secondary | ICD-10-CM

## 2022-11-24 DIAGNOSIS — Z6841 Body Mass Index (BMI) 40.0 and over, adult: Secondary | ICD-10-CM

## 2022-11-24 DIAGNOSIS — E559 Vitamin D deficiency, unspecified: Secondary | ICD-10-CM

## 2022-11-24 DIAGNOSIS — E669 Obesity, unspecified: Secondary | ICD-10-CM | POA: Diagnosis not present

## 2022-11-24 MED ORDER — VITAMIN D (ERGOCALCIFEROL) 1.25 MG (50000 UNIT) PO CAPS: 50000.0000 [IU] | ORAL_CAPSULE | ORAL | 0 refills | Status: DC

## 2022-11-24 MED ORDER — SEMAGLUTIDE (1 MG/DOSE) 4 MG/3ML ~~LOC~~ SOPN: 1.0000 mg | PEN_INJECTOR | SUBCUTANEOUS | 0 refills | Status: DC

## 2022-11-24 NOTE — Progress Notes (Signed)
Chief Complaint:   OBESITY Ashley Acevedo is here to discuss her progress with her obesity treatment plan along with follow-up of her obesity related diagnoses. Ashley Acevedo is on keeping a food journal and adhering to recommended goals of 1250 calories and 85 protein and states she is following her eating plan approximately 70% of the time. Ashley Acevedo states she is briskly walking 45 minutes 2 times per week.  Today's visit was #: 33 Starting weight: 295 lbs Starting date: 08/24/2019 Today's weight: 367 lbs Today's date: 11/24/2022 Total lbs lost to date:0 lbs Total lbs lost since last in-office visit: 4 lbs  Interim History:  She estimates to track intake 1005 with hitting prescribed protein and calorie goal at least 70%. Since her husband's recent diabetes dx (type 1.5)0 they both have dramatically reduced sugar/CHO intake and increased intensity of weekly walks.  Subjective:   1. Insulin resistance Discussed labs with pt- worsening. Lab Results  Component Value Date   HGBA1C 5.7 (H) 11/05/2022   HGBA1C 5.5 03/04/2022   HGBA1C 5.6 08/15/2021   11/05/22 Insulin level worsened to 39.0  11/05/22 CMP- electrolytes and kidney fx both stable.   11/05/22 ozempic increased from 0.57m to 154m she denies SE with strength increase.  2. Vitamin D deficiency Discussed labs with pt-worsening. 11/05/22 Vit D Level- 17.7 She was taking Ergocalciferol weekly not every 3 days as ordered.  Assessment/Plan:   1. Insulin resistance Refill Ozempic 69m34mnce weekly injection, Disp 3ml39m 0  2. Vitamin D deficiency Refill and Increase - Vitamin D, Ergocalciferol, (DRISDOL) 1.25 MG (50000 UNIT) CAPS capsule; Take 1 capsule (50,000 Units total) by mouth every 3 (three) days. Take 1 cap every 3 days  Dispense: 12 capsule; Refill: 0  3. Obesity, current BMI 55.9  Ashley Acevedo is currently in the action stage of change. As such, her goal is to continue with weight loss efforts. She has agreed to keeping a food journal and  adhering to recommended goals of 1250 calories and 85 protein.   Exercise goals:  Continue regular, vigorous walking.  Behavioral modification strategies: increasing lean protein intake, decreasing simple carbohydrates, increasing vegetables, increasing water intake, decreasing sodium intake, increasing high fiber foods, no skipping meals, meal planning and cooking strategies, keeping healthy foods in the home, planning for success, and keeping a strict food journal.  Ashley Acevedo has agreed to follow-up with our clinic in 4 weeks. She was informed of the importance of frequent follow-up visits to maximize her success with intensive lifestyle modifications for her multiple health conditions.  Objective:   Blood pressure 122/80, pulse (!) 115, temperature 98.2 F (36.8 C), height 5' 8"$  (1.727 m), weight (!) 367 lb (166.5 kg), SpO2 97 %. Body mass index is 55.8 kg/m.  General: Cooperative, alert, well developed, in no acute distress. HEENT: Conjunctivae and lids unremarkable. Cardiovascular: Regular rhythm.  Lungs: Normal work of breathing. Neurologic: No focal deficits.   Lab Results  Component Value Date   CREATININE 0.67 11/05/2022   BUN 12 11/05/2022   NA 141 11/05/2022   K 4.6 11/05/2022   CL 104 11/05/2022   CO2 23 11/05/2022   Lab Results  Component Value Date   ALT 29 11/05/2022   AST 18 11/05/2022   ALKPHOS 112 11/05/2022   BILITOT 0.3 11/05/2022   Lab Results  Component Value Date   HGBA1C 5.7 (H) 11/05/2022   HGBA1C 5.5 03/04/2022   HGBA1C 5.6 08/15/2021   HGBA1C 5.2 12/20/2020   HGBA1C 5.3 07/25/2020  Lab Results  Component Value Date   INSULIN 39.0 (H) 11/05/2022   INSULIN 17.7 03/04/2022   INSULIN 103.0 (H) 08/15/2021   INSULIN 19.7 12/20/2020   INSULIN 26.0 (H) 07/25/2020   Lab Results  Component Value Date   TSH 2.720 10/01/2022   Lab Results  Component Value Date   CHOL 183 03/04/2022   HDL 53 03/04/2022   LDLCALC 108 (H) 03/04/2022   TRIG 123  03/04/2022   CHOLHDL 3.5 03/04/2022   Lab Results  Component Value Date   VD25OH 17.7 (L) 11/05/2022   VD25OH 27.1 (L) 03/04/2022   VD25OH 24.9 (L) 08/15/2021   Lab Results  Component Value Date   WBC 8.1 12/20/2020   HGB 12.8 12/20/2020   HCT 39.4 12/20/2020   MCV 85 12/20/2020   PLT 350 12/20/2020   Lab Results  Component Value Date   IRON 76 12/20/2020   TIBC 416 12/20/2020   FERRITIN 23 12/20/2020    Attestation Statements:    I have reviewed the above documentation for accuracy and completeness, and I agree with the above. -  Barnet Benavides d. Nyela Cortinas, NP-C

## 2022-11-26 ENCOUNTER — Telehealth: Payer: Self-pay | Admitting: Neurology

## 2022-11-26 NOTE — Telephone Encounter (Signed)
..   Pt understands that although there may be some limitations with this type of visit, we will take all precautions to reduce any security or privacy concerns.  Pt understands that this will be treated like an in office visit and we will file with pt's insurance, and there may be a patient responsible charge related to this service. ? ?

## 2022-11-26 NOTE — Telephone Encounter (Signed)
Noted  

## 2022-12-02 ENCOUNTER — Encounter: Payer: Self-pay | Admitting: Neurology

## 2022-12-02 ENCOUNTER — Telehealth (INDEPENDENT_AMBULATORY_CARE_PROVIDER_SITE_OTHER): Payer: 59 | Admitting: Neurology

## 2022-12-02 DIAGNOSIS — G935 Compression of brain: Secondary | ICD-10-CM

## 2022-12-02 NOTE — Patient Instructions (Addendum)
11/01/2022:  She is asymptomatic. Recommended weight loss. Monitor and come back with symptoms. Nice rounded tonsils, room around the brainstem at magnum foramen, asymptomatic, just monitor it, may let obgyn know if pregnant. Watch for swelling behind the eyes can cause vision loss. 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  There is normal signal within the cerebellar tonsils and adjacent brainstem and spinal cord. The brain was otherwise normal. Mild chronic maxillary, ethmoid and sphenoid sinusitis.   No acute findings.  Normal enhancement pattern.      Chiari Malformation  Chiari malformation (CM) is a type of brain abnormality that affects the parts of the brain called the cerebellum and the brain stem. The cerebellum is important for balance, and the brain stem is important for basic body functions, such as breathing and swallowing. Normally, the cerebellum is located in a space at the back of the skull, just above the opening in the skull (foramen magnum)where the spinal cord meets the brain stem. With CM, part of the cerebellum is located below the foramen magnum instead. The malformation can be mild with no or few symptoms, or it can be severe. CM can cause neck pain, headaches, balance problems, and other symptoms. What are the causes? CM is a condition that a person is born with (congenital). In rare cases, CM may also develop later in life, which is called acquired CM or secondary CM. These cases may be caused by a leak of the fluid that surrounds and protects the brain and spinal cord (cerebrospinal fluid), leading to low pressure. In acquired or secondary CM, abnormal pressure develops in the brain. This pushes the cerebellum down into the foramen magnum. What increases the risk? The following  factors may make you more likely to develop this condition: Being female. Having a family history of CM. What are the signs or symptoms? Symptoms of this condition may vary depending on the severity of your CM. In some cases, there are no symptoms. In other cases, symptoms may come and go. The most common symptom is a severe headache in the back of the head. The headache: May come and go. May spread to your neck and shoulders. May be worse when you cough, sneeze, or strain. Other symptoms include: Difficulty balancing or loss of coordination. Vision problems, such as double vision, tiny spots moving across your vision (floaters), or sensitivity to lights. Trouble swallowing or speaking or hearing. Feeling dizzy or fainting. Breathing problems, including breathing pauses during sleep (sleep apnea). Curved back (scoliosis). Inability to control when you urinate (incontinence). How is this diagnosed? This condition may be evaluated with a medical history and physical exam. This may include tests to check your balance and nerves (neurological exam). You may also have imaging tests, such as a CT scan or an MRI. How is this treated? Treatment for this condition depends on the severity of your symptoms. You may be treated with: Surgery to prevent the malformation from getting worse, or to treat severe symptoms or symptoms that are getting worse. Medicines or alternative treatments to relieve headaches or neck pain. If you do not have symptoms, you may not need treatment. Follow these instructions at home: Medicines Take over-the-counter and prescription medicines only as told by your health care provider. Ask your health care provider if the medicine  prescribed to you requires you to avoid driving or using machinery. General instructions If you feel like you might faint: Lie down right away and raise (elevate) your feet above the level of your heart. Breathe deeply and steadily. Wait until  all of the symptoms have passed. If you have problems with dizziness, get up slowly when lying down. Take several minutes to sit and then stand. Ask your health care provider which activities are safe for you and if you have any activity restrictions. Do not use any products that contain nicotine or tobacco. These products include cigarettes, chewing tobacco, and vaping devices, such as e-cigarettes. If you need help quitting, ask your health care provider. Drink enough fluid to keep your urine pale yellow. Consider joining a CM support group. Keep all follow-up visits. This is important. Where to find more information [employer] of Neurological Disorders and Stroke: [employer] Contact a health care provider if: You have new symptoms. Your symptoms get worse. Get help right away if: You develop weakness or numbness in one or more of your limbs. You develop dizziness, slurred speech, double vision, weakness, or numbness with a severe headache. These symptoms may represent a serious problem that is an emergency. Do not wait to see if the symptoms will go away. Get medical help right away. Call your local emergency services (911 in the [place] Summary A Chiari malformation is a condition in which part of the cerebellum moves down through the foramen magnum. The malformation can be mild with no symptoms, or it can be severe. In some cases, no treatment is needed. In others, medicines are used to treat headaches. Surgery is done in the worst of cases. This information is not intended to replace advice given to you by your health care provider. Make sure you discuss any questions you have with your health care provider. Document Revised: 12/25/2020 Document Reviewed: 12/25/2020 Elsevier Patient Education  [employer].

## 2022-12-02 NOTE — Progress Notes (Signed)
[facility]    Provider:  Dr Jaynee Eagles Requesting Provider: Inda Coke, PA Primary Care Provider:  Inda Coke, [place]  Virtual Visit via Video Note  I connected with Ashley Acevedo on 12/02/22 at  1:00 PM EST by a video enabled telemedicine application and verified that I am speaking with the correct person using two identifiers.  Location: Patient: home Provider: Office   I discussed the limitations of evaluation and management by telemedicine and the availability of in person appointments. The patient expressed understanding and agreed to proceed.  Follow Up Instructions:    I discussed the assessment and treatment plan with the patient. The patient was provided an opportunity to ask questions and all were answered. The patient agreed with the plan and demonstrated an understanding of the instructions.   The patient was advised to call back or seek an in-person evaluation if the symptoms worsen or if the condition fails to improve as anticipated.  I provided 20 minutes of non-face-to-face time during this encounter.   Melvenia Beam, MD   CC:  Fhx of chiari malformation, headaches  11/01/2022:  7-35m chiari. She is asymptomatic. Recommended weight loss. Monitor and come back if you have symptoms symptoms. Nice rounded tonsils, room around the brainstem at magnum foramen, asymptomatic, just monitor it, may let obgyn know if pregnant. Watch for swelling behind the eyes can cause vision loss. 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  There is normal signal within the cerebellar tonsils and adjacent brainstem and spinal cord. The brain was otherwise normal. Mild chronic maxillary, ethmoid and sphenoid sinusitis.   No  acute findings.  Normal enhancement pattern.    We discussed, if asymptomatic can monitor clinically. Interesting that she has so many family members with a chiari malformation.   Patient complains of symptoms per HPI as well as the following symptoms: none . Pertinent negatives and positives per HPI. All others negative  HPI:  Ashley Acevedo is a 27y.o. female here as requested by WInda Coke PA for Fhx of chiari malformation.  I reviewed Dr. BMelba CoonStuckert's notes which show she has a history of Chiari malformation and her mother, she has anxiety, asthma since a child otherwise seems healthy, review of systems showed that she is morbidly obese body mass 50-59 in adult otherwise examination was unremarkable and Pap smear was negative for intraepithelial lesion or malignancy.  Dr. BMelba Coondiscussed because she has a family history in mother and grandmother of Chiari malformation that before patient has kids that she should be tested, this is something is starting to think about.  Both mother and grandmother had chiari malformation. They did not have surgery.She has occasional headache in the back of the head moderate pain can last briefly while exerting herself with coughing then goes away, it can get worse with coughing, straining, may have occ neck pain, no dizziness or balance problems, occ blurry vision, no muscle weakness, no numbness or tingling in the arms or legs, has headaches several times a months and triggered by coughing/valsalva. No blurred vision or double vision, no swallowing problems, no hearing loss or ringing in the ears, no problems with hands or difficulty walking or problems with bowel or bladder control. No other focal neurologic  deficits, associated symptoms, inciting events or modifiable factors.   Reviewed notes, labs and imaging from outside physicians, which showed:  03/04/2022: Hgba1c 5.5     Latest Ref Rng & Units 11/05/2022    9:19 AM 10/01/2022    9:37 AM  03/04/2022   11:13 AM  CMP  Glucose 70 - 99 mg/dL 92  87  77   BUN 6 - 20 mg/dL 12  12  10   $ Creatinine 0.57 - 1.00 mg/dL 0.67  0.70  0.65   Sodium 134 - 144 mmol/L 141  138  140   Potassium 3.5 - 5.2 mmol/L 4.6  4.5  4.6   Chloride 96 - 106 mmol/L 104  103  104   CO2 20 - 29 mmol/L 23  23  23   $ Calcium 8.7 - 10.2 mg/dL 9.1  9.1  9.0   Total Protein 6.0 - 8.5 g/dL 7.1   6.6   Total Bilirubin 0.0 - 1.2 mg/dL 0.3   0.2   Alkaline Phos 44 - 121 IU/L 112   107   AST 0 - 40 IU/L 18   22   ALT 0 - 32 IU/L 29   32       Latest Ref Rng & Units 12/20/2020   10:12 AM 04/11/2020    1:25 PM 01/02/2020   10:12 AM  CBC  WBC 3.4 - 10.8 x10E3/uL 8.1  7.8  6.4   Hemoglobin 11.1 - 15.9 g/dL 12.8  12.3  12.4   Hematocrit 34.0 - 46.6 % 39.4  38.2  39.7   Platelets 150 - 450 x10E3/uL 350  300  327     Review of Systems: Patient complains of symptoms per HPI as well as the following symptoms headache. Pertinent negatives and positives per HPI. All others negative.   Social History   Socioeconomic History   Marital status: Married    Spouse name: Not on file   Number of children: Not on file   Years of education: Not on file   Highest education level: Not on file  Occupational History   Not on file  Tobacco Use   Smoking status: Never   Smokeless tobacco: Never  Vaping Use   Vaping Use: Never used  Substance and Sexual Activity   Alcohol use: Yes    Comment: socially   Drug use: No   Sexual activity: Yes    Birth control/protection: Pill  Other Topics Concern   Not on file  Social History Narrative   Biology degree --> wants to do research   Works at a [employer]    No children or pets   Social Determinants of Radio broadcast assistant Strain: Not on file  Food Insecurity: Not on file  Transportation Needs: Not on file  Physical Activity: Not on file  Stress: Not on file  Social Connections: Not on file  Intimate Partner Violence: Not on file     Family History  Problem Relation Age of Onset   Asthma Mother    Obesity Mother    Chiari malformation Mother    Hyperlipidemia Father    Atrial fibrillation Father        had ablation and is now in rhythm   Asthma Maternal Grandmother    Stomach cancer Maternal Grandmother    Chiari malformation Maternal Grandmother    Lung cancer Maternal Grandfather    Prostate cancer Maternal Grandfather    Colon cancer Maternal Grandfather  late 60's/early 70's   Alzheimer's disease Maternal Grandfather    Brain cancer Paternal Grandmother    Lung cancer Paternal Grandfather    Allergic rhinitis Neg Hx    Angioedema Neg Hx    Eczema Neg Hx    Immunodeficiency Neg Hx    Urticaria Neg Hx     Past Medical History:  Diagnosis Date   Allergies    Anxiety    Asthma    Back pain    GERD (gastroesophageal reflux disease)    Heartburn    History of stomach ulcers    Hyperlipidemia    Joint pain     Patient Active Problem List   Diagnosis Date Noted   Chiari I malformation ([place]) 12/02/2022   BMI 50.0-59.9, adult ([place]) 11/24/2022   At risk for osteoporosis 04/01/2022   Vitamin B12 deficiency 03/11/2022   Nausea in adult 11/27/2021   Moderate persistent asthma without complication [id number]   Pollen-food allergy 06/21/2021   Gastroesophageal reflux disease 06/21/2021   Fatigue 12/20/2020   Mixed hyperlipidemia 06/12/2020   B12 nutritional deficiency 01/23/2020   Insulin resistance 01/23/2020   Vitamin D deficiency 01/23/2020   Depression 01/23/2020   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/2020   Class 3 severe obesity with serious comorbidity and body mass index (BMI) of 50.0 to 59.9 in adult ([place]) 01/23/2020   Mild persistent asthma without complication [id number]   Seasonal and perennial allergic rhinitis 11/02/2017   Seasonal allergic conjunctivitis 11/02/2017   Anxiety 04/10/2016    Past Surgical History:  Procedure Laterality Date   WISDOM TOOTH EXTRACTION      Current  Outpatient Medications  Medication Sig Dispense Refill   albuterol (VENTOLIN HFA) 108 (90 Base) MCG/ACT inhaler Inhale 2 puffs into the lungs every 4 (four) hours as needed for wheezing or shortness of breath. 18 g 1   cetirizine (ZYRTEC) 10 MG tablet Take 10 mg by mouth daily.     drospirenone-ethinyl estradiol (YAZ,GIANVI,LORYNA) 3-0.02 MG tablet Take 1 tablet by mouth daily.     escitalopram (LEXAPRO) 10 MG tablet Take 1 tablet (10 mg total) by mouth daily. 90 tablet 0   fluticasone (FLONASE) 50 MCG/ACT nasal spray Place 1 spray into both nostrils daily.     pantoprazole (PROTONIX) 40 MG tablet TAKE 1 TABLET(40 MG) BY MOUTH DAILY 90 tablet 0   Semaglutide, 1 MG/DOSE, 4 MG/3ML SOPN Inject 1 mg as directed once a week. 3 mL 0   Vitamin D, Ergocalciferol, (DRISDOL) 1.25 MG (50000 UNIT) CAPS capsule Take 1 capsule (50,000 Units total) by mouth every 3 (three) days. Take 1 cap every 3 days 12 capsule 0   No current facility-administered medications for this visit.    Allergies as of 12/02/2022 - Review Complete 11/24/2022  Allergen Reaction Noted   Clindamycin Rash 11/17/2018   Codeine Rash 04/10/2016   Hydrocodone Rash 11/02/2017    Vitals: There were no vitals taken for this visit. Last Weight:  Wt Readings from Last 1 Encounters:  11/24/22 (!) 367 lb (166.5 kg)   Last Height:   Ht Readings from Last 1 Encounters:  11/24/22 5' 8"$  (1.727 m)      Physical exam: Exam: Gen: NAD, conversant      CV:  No palpitations or chest pain or SOB. VS: Breathing at a normal rate. Weight appears obese. Not febrile. Eyes: Conjunctivae clear without exudates or hemorrhage  Neuro: Detailed Neurologic Exam  Speech:    Speech is normal; fluent and spontaneous with normal comprehension.  Cognition:  The patient is oriented to person, place, and time;     recent and remote memory intact;     language fluent;     normal attention, concentration,     fund of knowledge Cranial Nerves:     The pupils are equal, round, and reactive to light. Cannot perform fundoscopic exam. Visual fields are full to finger confrontation. Extraocular movements are intact.  The face is symmetric with normal sensation. The palate elevates in the midline. Hearing intact. Voice is normal. Shoulder shrug is normal. The tongue has normal motion without fasciculations.   Coordination:    Normal finger to nose  Gait:    Normal native gait  Motor Observation:   no involuntary movements noted. Tone:    Appears normal  Posture:    Posture is normal. normal erect    Strength:    Strength is anti-gravity and symmetric in the upper and lower limbs.      Sensation: intact to LT           Assessment/Plan: This is a 27 year old female with a family history of Chiari malformation in her mother and her grandmother.  She is morbidly obese.  She is worried about getting pregnant (currently not pregnant) with a Chiari malformation.  She does have headaches in the back of her head, neck pain, occipital headaches on coughing and Valsalva, occasional blurry vision, her funduscopic exam looked relatively normal with some blurring of the margins cannot rule out papilledema.  We need imaging for this patient.   11/01/2022:  7-53m chiari. She is asymptomatic. Recommended weight loss. Monitor and come back if you have symptoms symptoms. Nice rounded tonsils, room around the brainstem at magnum foramen, asymptomatic, just monitor it, may let obgyn know if pregnant. Watch for swelling behind the eyes can cause vision loss. 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  There is normal signal within the cerebellar tonsils and adjacent brainstem and spinal cord. The brain was otherwise normal. Mild chronic maxillary, ethmoid and sphenoid  sinusitis.   No acute findings.  Normal enhancement pattern.    Patient complains of symptoms per HPI as well as the following symptoms: none . Pertinent negatives and positives per HPI. All others negative   Patient complains of symptoms per HPI as well as the following symptoms: none . Pertinent negatives and positives per HPI. All others negative  Discussed: To prevent or relieve headaches, try the following: Cool Compress. Lie down and place a cool compress on your head.  Avoid headache triggers. If certain foods or odors seem to have triggered your migraines in the past, avoid them. A headache diary might help you identify triggers.  Include physical activity in your daily routine. Try a daily walk or other moderate aerobic exercise.  Manage stress. Find healthy ways to cope with the stressors, such as delegating tasks on your to-do list.  Practice relaxation techniques. Try deep breathing, yoga, massage and visualization.  Eat regularly. Eating regularly scheduled meals and maintaining a healthy diet might help prevent headaches. Also, drink plenty of fluids.  Follow a regular sleep schedule. Sleep deprivation might contribute to headaches Consider biofeedback. With this mind-body technique, you learn to control certain bodily functions -- such as muscle tension, heart rate and blood pressure -- to prevent headaches or reduce  headache pain.    Proceed to emergency room if you experience new or worsening symptoms or symptoms do not resolve, if you have new neurologic symptoms or if headache is severe, or for any concerning symptom.   Provided education and documentation on chiari in the AVS   Cc: Inda Coke, [place],  [place], [place], [place]  Sarina Ill, MD  [facility] [address] [place], [place] [address]  Phone [phone] Fax [phone]

## 2022-12-22 ENCOUNTER — Ambulatory Visit (INDEPENDENT_AMBULATORY_CARE_PROVIDER_SITE_OTHER): Payer: 59 | Admitting: Adult Health

## 2022-12-30 ENCOUNTER — Encounter (INDEPENDENT_AMBULATORY_CARE_PROVIDER_SITE_OTHER): Payer: Self-pay | Admitting: Adult Health

## 2022-12-30 ENCOUNTER — Ambulatory Visit (INDEPENDENT_AMBULATORY_CARE_PROVIDER_SITE_OTHER): Payer: 59 | Admitting: Adult Health

## 2022-12-30 DIAGNOSIS — Z6841 Body Mass Index (BMI) 40.0 and over, adult: Secondary | ICD-10-CM

## 2022-12-30 DIAGNOSIS — E669 Obesity, unspecified: Secondary | ICD-10-CM

## 2022-12-30 DIAGNOSIS — E559 Vitamin D deficiency, unspecified: Secondary | ICD-10-CM

## 2022-12-30 MED ORDER — OZEMPIC (2 MG/DOSE) 8 MG/3ML ~~LOC~~ SOPN: 2.0000 mg | PEN_INJECTOR | SUBCUTANEOUS | 0 refills | Status: DC

## 2022-12-30 MED ORDER — VITAMIN D (ERGOCALCIFEROL) 1.25 MG (50000 UNIT) PO CAPS: 50000.0000 [IU] | ORAL_CAPSULE | ORAL | 0 refills | Status: DC

## 2022-12-30 NOTE — Progress Notes (Signed)
WEIGHT SUMMARY AND BIOMETRICS  Vitals Temp: 98.1 F (36.7 C) BP: 139/80 Pulse Rate: 82 SpO2: 98 %   Anthropometric Measurements Height: 5\' 8"  (1.727 m) Weight: (!) 368 lb (166.9 kg) BMI (Calculated): 55.97 Weight at Last Visit: 367lb Weight Lost Since Last Visit: 0 Weight Gained Since Last Visit: 1lb Starting Weight: 295lb Total Weight Loss (lbs): 0 lb (0 kg)   Body Composition  Body Fat %: 57.3 % Fat Mass (lbs): 211.2 lbs Muscle Mass (lbs): 149.4 lbs Visceral Fat Rating : 21   Other Clinical Data Fasting: no Labs: no Today's Visit #: 32 Starting Date: 08/24/19    Chief Complaint:   OBESITY Ashley Acevedo is here to discuss her progress with her obesity treatment plan. She is on the keeping a food journal and adhering to recommended goals of 1250 calories and 85 protein and states she is following her eating plan approximately 60 % of the time. She states she is exercising walking 30 minutes 2 times per week.   Interim History:  Kitchen renovation started beginning March 2024- complete gut job. No kitchen facilities available in their home currently. They have been eating out, using other family/friends kitchens, and her in-laws boat to feed themselves.   She has been on Ozempic 0.5 mg since spring 2023.   Increased Ozempic 1mg  on Jan 2024. She has had  6 weeks of 1mg  strength. Denies mass in neck, dysphagia, dyspepsia, persistent hoarseness, abdominal pain, or N/V/C   Subjective:   1. Vitamin D deficiency  Latest Reference Range & Units 11/05/22 09:19  Vitamin D, 25-Hydroxy 30.0 - 100.0 ng/mL 17.7 (L)  (L): Data is abnormally low   Assessment/Plan:   1. Vitamin D deficiency Refill - Vitamin D, Ergocalciferol, (DRISDOL) 1.25 MG (50000 UNIT) CAPS capsule; Take 1 capsule (50,000 Units total) by mouth every 3 (three) days. Take 1 cap every 3 days  Dispense: 12 capsule; Refill: 0  3. Obesity, current BMI 55.97 Refill and increase Ozempic 2mg  once week  Disp 54ml RF 0  [name] is currently in the action stage of change. As such, her goal is to continue with weight loss efforts. She has agreed to keeping a food journal and adhering to recommended goals of 1250 calories and 85 protein.   Exercise goals: For substantial health benefits, adults should do at least 150 minutes (2 hours and 30 minutes) a week of moderate-intensity, or 75 minutes (1 hour and 15 minutes) a week of vigorous-intensity aerobic physical activity, or an equivalent combination of moderate- and vigorous-intensity aerobic activity. Aerobic activity should be performed in episodes of at least 10 minutes, and preferably, it should be spread throughout the week.  Behavioral modification strategies: increasing lean protein intake, decreasing simple carbohydrates, increasing vegetables, increasing water intake, planning for success, and keeping a strict food journal.  Clytee has agreed to follow-up with our clinic in 4 weeks. She was informed of the importance of frequent follow-up visits to maximize her success with intensive lifestyle modifications for her multiple health conditions.   Objective:   Blood pressure 139/80, pulse 82, temperature 98.1 F (36.7 C), height 5\' 8"  (1.727 m), weight (!) 368 lb (166.9 kg), SpO2 98 %. Body mass index is 55.95 kg/m.  General: Cooperative, alert, well developed, in no acute distress. HEENT: Conjunctivae and lids unremarkable. Cardiovascular: Regular rhythm.  Lungs: Normal work of breathing. Neurologic: No focal deficits.   Lab Results  Component Value Date   CREATININE 0.67 11/05/2022   BUN 12 11/05/2022  NA 141 11/05/2022   K 4.6 11/05/2022   CL 104 11/05/2022   CO2 23 11/05/2022   Lab Results  Component Value Date   ALT 29 11/05/2022   AST 18 11/05/2022   ALKPHOS 112 11/05/2022   BILITOT 0.3 11/05/2022   Lab Results  Component Value Date   HGBA1C 5.7 (H) 11/05/2022   HGBA1C 5.5 03/04/2022   HGBA1C 5.6 08/15/2021   HGBA1C  5.2 12/20/2020   HGBA1C 5.3 07/25/2020   Lab Results  Component Value Date   INSULIN 39.0 (H) 11/05/2022   INSULIN 17.7 03/04/2022   INSULIN 103.0 (H) 08/15/2021   INSULIN 19.7 12/20/2020   INSULIN 26.0 (H) 07/25/2020   Lab Results  Component Value Date   TSH 2.720 10/01/2022   Lab Results  Component Value Date   CHOL 183 03/04/2022   HDL 53 03/04/2022   LDLCALC 108 (H) 03/04/2022   TRIG 123 03/04/2022   CHOLHDL 3.5 03/04/2022   Lab Results  Component Value Date   VD25OH 17.7 (L) 11/05/2022   VD25OH 27.1 (L) 03/04/2022   VD25OH 24.9 (L) 08/15/2021   Lab Results  Component Value Date   WBC 8.1 12/20/2020   HGB 12.8 12/20/2020   HCT 39.4 12/20/2020   MCV 85 12/20/2020   PLT 350 12/20/2020   Lab Results  Component Value Date   IRON 76 12/20/2020   TIBC 416 12/20/2020   FERRITIN 23 12/20/2020    Attestation Statements:   Reviewed by clinician on day of visit: allergies, medications, problem list, medical history, surgical history, family history, social history, and previous encounter notes.  I have reviewed the above documentation for accuracy and completeness, and I agree with the above. -  Deanie Jupiter d. Vasiliy Mccarry, NP-C

## 2023-02-06 ENCOUNTER — Other Ambulatory Visit (INDEPENDENT_AMBULATORY_CARE_PROVIDER_SITE_OTHER): Payer: Self-pay | Admitting: Adult Health

## 2023-02-06 DIAGNOSIS — F3289 Other specified depressive episodes: Secondary | ICD-10-CM

## 2023-02-06 DIAGNOSIS — K219 Gastro-esophageal reflux disease without esophagitis: Secondary | ICD-10-CM

## 2023-02-12 ENCOUNTER — Encounter (INDEPENDENT_AMBULATORY_CARE_PROVIDER_SITE_OTHER): Payer: Self-pay | Admitting: Adult Health

## 2023-02-12 ENCOUNTER — Ambulatory Visit (INDEPENDENT_AMBULATORY_CARE_PROVIDER_SITE_OTHER): Payer: 59 | Admitting: Adult Health

## 2023-02-12 VITALS — BP 132/82 | HR 94 | Temp 98.2°F | Ht 68.0 in | Wt 360.0 lb

## 2023-02-12 DIAGNOSIS — E669 Obesity, unspecified: Secondary | ICD-10-CM

## 2023-02-12 DIAGNOSIS — E559 Vitamin D deficiency, unspecified: Secondary | ICD-10-CM | POA: Diagnosis not present

## 2023-02-12 DIAGNOSIS — F3289 Other specified depressive episodes: Secondary | ICD-10-CM | POA: Diagnosis not present

## 2023-02-12 DIAGNOSIS — Z6841 Body Mass Index (BMI) 40.0 and over, adult: Secondary | ICD-10-CM

## 2023-02-12 DIAGNOSIS — K219 Gastro-esophageal reflux disease without esophagitis: Secondary | ICD-10-CM | POA: Diagnosis not present

## 2023-02-12 MED ORDER — VITAMIN D (ERGOCALCIFEROL) 1.25 MG (50000 UNIT) PO CAPS: 50000.0000 [IU] | ORAL_CAPSULE | ORAL | 0 refills | Status: DC

## 2023-02-12 MED ORDER — OZEMPIC (2 MG/DOSE) 8 MG/3ML ~~LOC~~ SOPN: 2.0000 mg | PEN_INJECTOR | SUBCUTANEOUS | 0 refills | Status: DC

## 2023-02-12 MED ORDER — ESCITALOPRAM OXALATE 10 MG PO TABS: 10.0000 mg | ORAL_TABLET | Freq: Every day | ORAL | 0 refills | Status: DC

## 2023-02-12 MED ORDER — PANTOPRAZOLE SODIUM 40 MG PO TBEC: DELAYED_RELEASE_TABLET | ORAL | 0 refills | Status: DC

## 2023-02-12 NOTE — Progress Notes (Signed)
WEIGHT SUMMARY AND BIOMETRICS  Vitals Temp: 98.2 F (36.8 C) BP: 132/82 Pulse Rate: 94 SpO2: 98 %   Anthropometric Measurements Height: 5\' 8"  (1.727 m) Weight: (!) 360 lb (163.3 kg) BMI (Calculated): 54.75 Weight at Last Visit: 368lb Weight Lost Since Last Visit: 8lb Weight Gained Since Last Visit: 0 Starting Weight: 295lb Total Weight Loss (lbs): 0 lb (0 kg)   Body Composition  Body Fat %: 55.3 % Fat Mass (lbs): 199.4 lbs Muscle Mass (lbs): 153 lbs Total Body Water (lbs): 116.6 lbs Visceral Fat Rating : 19   Other Clinical Data Fasting: No Labs: No Today's Visit #: 40 Starting Date: 08/24/19    Chief Complaint:   OBESITY Ashley Acevedo is here to discuss her progress with her obesity treatment plan. She is on the keeping a food journal and adhering to recommended goals of 1250 calories and 85 protein and states she is following her eating plan approximately 50 % of the time. She states she is exercising Walking 30 minutes 3 times per week.   Interim History:  [name] did EXCEPTIONALLY well since last OV at [facility] on 12/30/2022 Their home kitchen underwent a complete renovation- unable to meal plan/prep the last 3-4 weeks. She and her husband "[name]" ate the following: Her parents Her in-laws Her sisters Several area restaurants- they had 10 restaurants listed in a bowl and would draw nightly to determine which establishment would provide dinner that evening.  Reviewed [facility] Results with pt: Muscle Mass: +3.6 lbs Adipose Mass: -11.8 lbs  Of Note- GLP-1, GIP/GLP-1 Hx: Has been on Victoza, Saxenda, Mounjaro- currently on Ozempic 2mg  once weekly injection Denies mass in neck, dysphagia, dyspepsia, persistent hoarseness, abdominal pain, or N/V/C   Subjective:   1. Vitamin D deficiency  Latest Reference Range & Units 08/15/21 08:31 03/04/22 11:13 11/05/22 09:19  Vitamin D, 25-Hydroxy 30.0 - 100.0 ng/mL 24.9 (L) 27.1 (L) 17.7 (L)  (L): Data is abnormally  low  2. Gastroesophageal reflux disease, unspecified whether esophagitis present GERD sx's well controlled with avoid known food triggers,not consuming large meals at night, and taking daily Protonix 40mg  QD  3. Other depression, with emotional eating Ms. Brendlinger endorses stable energy levels, denies Si/HI She is on daily Lexapro 10mg   Assessment/Plan:   1. Vitamin D deficiency Refill - Vitamin D, Ergocalciferol, (DRISDOL) 1.25 MG (50000 UNIT) CAPS capsule; Take 1 capsule (50,000 Units total) by mouth every 3 (three) days. Take 1 cap every 3 days  Dispense: 12 capsule; Refill: 0  2. Gastroesophageal reflux disease, unspecified whether esophagitis present Refill - pantoprazole (PROTONIX) 40 MG tablet; TAKE 1 TABLET(40 MG) BY MOUTH DAILY  Dispense: 90 tablet; Refill: 0  3. Other depression, with emotional eating Refill - escitalopram (LEXAPRO) 10 MG tablet; Take 1 tablet (10 mg total) by mouth daily.  Dispense: 90 tablet; Refill: 0  4. Obesity, current BMI 54.75 Refill Semaglutide, 2 MG/DOSE, (OZEMPIC, 2 MG/DOSE,) 8 MG/3ML SOPN Inject 2 mg into the skin once a week. Dispense: 3 mL, Refills: 0 ordered   [name] is currently in the action stage of change. As such, her goal is to continue with weight loss efforts. She has agreed to keeping a food journal and adhering to recommended goals of 1250 calories and 85 protein.   Exercise goals: All adults should avoid inactivity. Some physical activity is better than none, and adults who participate in any amount of physical activity gain some health benefits. For additional and more extensive health benefits, adults should  increase their aerobic physical activity to 300 minutes (5 hours) a week of moderate-intensity, or 150 minutes a week of vigorous-intensity aerobic physical activity, or an equivalent combination of moderate- and vigorous-intensity activity. Additional health benefits are gained by engaging in physical activity beyond this amount.   Adults should also include muscle-strengthening activities that involve all major muscle groups on 2 or more days a week.  Behavioral modification strategies: increasing lean protein intake, decreasing simple carbohydrates, increasing vegetables, increasing water intake, no skipping meals, meal planning and cooking strategies, and planning for success.  [name] has agreed to follow-up with our clinic in 4 weeks. She was informed of the importance of frequent follow-up visits to maximize her success with intensive lifestyle modifications for her multiple health conditions.   Objective:   Blood pressure 132/82, pulse 94, temperature 98.2 F (36.8 C), height 5\' 8"  (1.727 m), weight (!) 360 lb (163.3 kg), SpO2 98 %. Body mass index is 54.74 kg/m.  General: Cooperative, alert, well developed, in no acute distress. HEENT: Conjunctivae and lids unremarkable. Cardiovascular: Regular rhythm.  Lungs: Normal work of breathing. Neurologic: No focal deficits.   Lab Results  Component Value Date   CREATININE 0.67 11/05/2022   BUN 12 11/05/2022   NA 141 11/05/2022   K 4.6 11/05/2022   CL 104 11/05/2022   CO2 23 11/05/2022   Lab Results  Component Value Date   ALT 29 11/05/2022   AST 18 11/05/2022   ALKPHOS 112 11/05/2022   BILITOT 0.3 11/05/2022   Lab Results  Component Value Date   HGBA1C 5.7 (H) 11/05/2022   HGBA1C 5.5 03/04/2022   HGBA1C 5.6 08/15/2021   HGBA1C 5.2 12/20/2020   HGBA1C 5.3 07/25/2020   Lab Results  Component Value Date   INSULIN 39.0 (H) 11/05/2022   INSULIN 17.7 03/04/2022   INSULIN 103.0 (H) 08/15/2021   INSULIN 19.7 12/20/2020   INSULIN 26.0 (H) 07/25/2020   Lab Results  Component Value Date   TSH 2.720 10/01/2022   Lab Results  Component Value Date   CHOL 183 03/04/2022   HDL 53 03/04/2022   LDLCALC 108 (H) 03/04/2022   TRIG 123 03/04/2022   CHOLHDL 3.5 03/04/2022   Lab Results  Component Value Date   VD25OH 17.7 (L) 11/05/2022   VD25OH 27.1 (L)  03/04/2022   VD25OH 24.9 (L) 08/15/2021   Lab Results  Component Value Date   WBC 8.1 12/20/2020   HGB 12.8 12/20/2020   HCT 39.4 12/20/2020   MCV 85 12/20/2020   PLT 350 12/20/2020   Lab Results  Component Value Date   IRON 76 12/20/2020   TIBC 416 12/20/2020   FERRITIN 23 12/20/2020    Attestation Statements:   Reviewed by clinician on day of visit: allergies, medications, problem list, medical history, surgical history, family history, social history, and previous encounter notes.  I have reviewed the above documentation for accuracy and completeness, and I agree with the above. -  Kameah Rawl d. Oviya Ammar, NP-C

## 2023-02-17 ENCOUNTER — Telehealth (INDEPENDENT_AMBULATORY_CARE_PROVIDER_SITE_OTHER): Payer: Self-pay | Admitting: Adult Health

## 2023-02-17 NOTE — Telephone Encounter (Signed)
PA was submitted for Ozempic today, awaiting a determination. 02/17/23

## 2023-02-18 NOTE — Telephone Encounter (Signed)
PA for Ozempic was denied. 02/18/2023

## 2023-02-27 ENCOUNTER — Encounter: Payer: Self-pay | Admitting: Physician Assistant

## 2023-03-12 ENCOUNTER — Ambulatory Visit (INDEPENDENT_AMBULATORY_CARE_PROVIDER_SITE_OTHER): Payer: 59 | Admitting: Adult Health

## 2023-03-18 ENCOUNTER — Ambulatory Visit (INDEPENDENT_AMBULATORY_CARE_PROVIDER_SITE_OTHER): Payer: 59 | Admitting: Adult Health

## 2023-03-20 ENCOUNTER — Other Ambulatory Visit (INDEPENDENT_AMBULATORY_CARE_PROVIDER_SITE_OTHER): Payer: Self-pay | Admitting: Adult Health

## 2023-03-20 DIAGNOSIS — E559 Vitamin D deficiency, unspecified: Secondary | ICD-10-CM

## 2023-04-21 ENCOUNTER — Other Ambulatory Visit (INDEPENDENT_AMBULATORY_CARE_PROVIDER_SITE_OTHER): Payer: Self-pay | Admitting: Adult Health

## 2023-04-21 DIAGNOSIS — F3289 Other specified depressive episodes: Secondary | ICD-10-CM

## 2023-04-22 ENCOUNTER — Encounter (INDEPENDENT_AMBULATORY_CARE_PROVIDER_SITE_OTHER): Payer: Self-pay

## 2023-04-22 ENCOUNTER — Other Ambulatory Visit (INDEPENDENT_AMBULATORY_CARE_PROVIDER_SITE_OTHER): Payer: Self-pay | Admitting: Adult Health

## 2023-04-22 ENCOUNTER — Other Ambulatory Visit (INDEPENDENT_AMBULATORY_CARE_PROVIDER_SITE_OTHER): Payer: Self-pay

## 2023-04-22 ENCOUNTER — Encounter (INDEPENDENT_AMBULATORY_CARE_PROVIDER_SITE_OTHER): Payer: Self-pay | Admitting: Adult Health

## 2023-04-22 DIAGNOSIS — F3289 Other specified depressive episodes: Secondary | ICD-10-CM

## 2023-04-22 MED ORDER — ESCITALOPRAM OXALATE 10 MG PO TABS
10.0000 mg | ORAL_TABLET | Freq: Every day | ORAL | 0 refills | Status: DC
Start: 2023-04-22 — End: 2023-04-27

## 2023-04-27 ENCOUNTER — Ambulatory Visit (INDEPENDENT_AMBULATORY_CARE_PROVIDER_SITE_OTHER): Payer: 59 | Admitting: Adult Health

## 2023-04-27 ENCOUNTER — Encounter (INDEPENDENT_AMBULATORY_CARE_PROVIDER_SITE_OTHER): Payer: Self-pay | Admitting: Adult Health

## 2023-04-27 VITALS — BP 133/79 | HR 95 | Temp 97.7°F | Ht 68.0 in | Wt 366.0 lb

## 2023-04-27 DIAGNOSIS — E559 Vitamin D deficiency, unspecified: Secondary | ICD-10-CM

## 2023-04-27 DIAGNOSIS — K219 Gastro-esophageal reflux disease without esophagitis: Secondary | ICD-10-CM

## 2023-04-27 DIAGNOSIS — F3289 Other specified depressive episodes: Secondary | ICD-10-CM

## 2023-04-27 DIAGNOSIS — E669 Obesity, unspecified: Secondary | ICD-10-CM

## 2023-04-27 DIAGNOSIS — E66813 Obesity, class 3: Secondary | ICD-10-CM

## 2023-04-27 DIAGNOSIS — Z6841 Body Mass Index (BMI) 40.0 and over, adult: Secondary | ICD-10-CM

## 2023-04-27 MED ORDER — VITAMIN D (ERGOCALCIFEROL) 1.25 MG (50000 UNIT) PO CAPS
50000.0000 [IU] | ORAL_CAPSULE | ORAL | Status: DC
Start: 2023-04-27 — End: 2023-07-28

## 2023-04-27 MED ORDER — PANTOPRAZOLE SODIUM 40 MG PO TBEC
DELAYED_RELEASE_TABLET | ORAL | 0 refills | Status: DC
Start: 2023-04-27 — End: 2023-07-28

## 2023-04-27 MED ORDER — ESCITALOPRAM OXALATE 10 MG PO TABS: 10.0000 mg | ORAL_TABLET | Freq: Every day | ORAL | 0 refills | Status: DC

## 2023-04-27 NOTE — Progress Notes (Signed)
WEIGHT SUMMARY AND BIOMETRICS  Vitals Temp: 97.7 F (36.5 C) BP: 133/79 Pulse Rate: 95 SpO2: 98 %   Anthropometric Measurements Height: 5\' 8"  (1.727 m) Weight: (!) 366 lb (166 kg) BMI (Calculated): 55.66 Weight at Last Visit: 360lb Weight Lost Since Last Visit: 0 Weight Gained Since Last Visit: 6lb Starting Weight: 295lb Total Weight Loss (lbs): 0 lb (0 kg)   Body Composition  Body Fat %: 57.3 % Fat Mass (lbs): 209.8 lbs Muscle Mass (lbs): 148.4 lbs Visceral Fat Rating : 21   Other Clinical Data Fasting: yes Labs: no Today's Visit #: 84 Starting Date: 08/24/19    Chief Complaint:   OBESITY Elizabth is here to discuss her progress with her obesity treatment plan. She is on the keeping a food journal and adhering to recommended goals of 1250 calories and 85 protein and states she is following her eating plan approximately 50-60 % of the time. She states she is not currently exercising.   Interim History:  Ms. Tweten estimates to have used Ozempic 1mg  early May 2024 Her insurance stopped coverage  Hydration-she estimates to drink at least 80 oz water/day  Her sister became engaged over there weekend!  Subjective:   1.GERD Ms. Altman reports acid reflux sx's well controlled with daily PPI/Protonix  2. Vitamin D deficiency  Latest Reference Range & Units 03/04/22 11:13 11/05/22 09:19  Vitamin D, 25-Hydroxy 30.0 - 100.0 ng/mL 27.1 (L) 17.7 (L)  (L): Data is abnormally low  She is on weekly Ergocalciferol- denies N/V/Muscle Weakness  3. Other depression, with emotional eating Ms. Trevino reports stable mood, denies SI/HI She missed a few days of Lexapro 10mg  due to gap in OV f/u She was able to resume last week  Assessment/Plan:   GERD Refill pantoprazole (PROTONIX) 40 MG tablet TAKE 1 TABLET(40 MG) BY MOUTH DAILY Dispense: 90 tablet, Refills: 0 ordered   Avoid know trigger foods  1. Vitamin D deficiency Refill and reduce - Vitamin D, Ergocalciferol,  (DRISDOL) 1.25 MG (50000 UNIT) CAPS capsule; Take 1 capsule (50,000 Units total) by mouth every 3 (three) days. Take 1 cap every 7 days  2. Other depression, with emotional eating Refill - escitalopram (LEXAPRO) 10 MG tablet; Take 1 tablet (10 mg total) by mouth daily.  Dispense: 90 tablet; Refill: 0  3. Obesity, current BMI 55.66  Dane is currently in the action stage of change. As such, her goal is to continue with weight loss efforts. She has agreed to keeping a food journal and adhering to recommended goals of 1250 calories and 85 protein.   Exercise goals: All adults should avoid inactivity. Some physical activity is better than none, and adults who participate in any amount of physical activity gain some health benefits. Adults should also include muscle-strengthening activities that involve all major muscle groups on 2 or more days a week.  Behavioral modification strategies: increasing lean protein intake, decreasing simple carbohydrates, increasing vegetables, increasing water intake, meal planning and cooking strategies, keeping healthy foods in the home, and planning for success.  Vanisha has agreed to follow-up with our clinic in 4 weeks. She was informed of the importance of frequent follow-up visits to maximize her success with intensive lifestyle modifications for her multiple health conditions.   Cj\heck Fasting Labs at next OV  Objective:   Blood pressure 133/79, pulse 95, temperature 97.7 F (36.5 C), height 5\' 8"  (1.727 m), weight (!) 366 lb (166 kg), SpO2 98%. Body mass index is 55.65 kg/m.  General: Cooperative, alert, well developed, in no acute distress. HEENT: Conjunctivae and lids unremarkable. Cardiovascular: Regular rhythm.  Lungs: Normal work of breathing. Neurologic: No focal deficits.   Lab Results  Component Value Date   CREATININE 0.67 11/05/2022   BUN 12 11/05/2022   NA 141 11/05/2022   K 4.6 11/05/2022   CL 104 11/05/2022   CO2 23 11/05/2022    Lab Results  Component Value Date   ALT 29 11/05/2022   AST 18 11/05/2022   ALKPHOS 112 11/05/2022   BILITOT 0.3 11/05/2022   Lab Results  Component Value Date   HGBA1C 5.7 (H) 11/05/2022   HGBA1C 5.5 03/04/2022   HGBA1C 5.6 08/15/2021   HGBA1C 5.2 12/20/2020   HGBA1C 5.3 07/25/2020   Lab Results  Component Value Date   INSULIN 39.0 (H) 11/05/2022   INSULIN 17.7 03/04/2022   INSULIN 103.0 (H) 08/15/2021   INSULIN 19.7 12/20/2020   INSULIN 26.0 (H) 07/25/2020   Lab Results  Component Value Date   TSH 2.720 10/01/2022   Lab Results  Component Value Date   CHOL 183 03/04/2022   HDL 53 03/04/2022   LDLCALC 108 (H) 03/04/2022   TRIG 123 03/04/2022   CHOLHDL 3.5 03/04/2022   Lab Results  Component Value Date   VD25OH 17.7 (L) 11/05/2022   VD25OH 27.1 (L) 03/04/2022   VD25OH 24.9 (L) 08/15/2021   Lab Results  Component Value Date   WBC 8.1 12/20/2020   HGB 12.8 12/20/2020   HCT 39.4 12/20/2020   MCV 85 12/20/2020   PLT 350 12/20/2020   Lab Results  Component Value Date   IRON 76 12/20/2020   TIBC 416 12/20/2020   FERRITIN 23 12/20/2020    Attestation Statements:   Reviewed by clinician on day of visit: allergies, medications, problem list, medical history, surgical history, family history, social history, and previous encounter notes.  I have reviewed the above documentation for accuracy and completeness, and I agree with the above. -  Madeliene Tejera d. Sameka Bagent, NP-C

## 2023-06-01 ENCOUNTER — Ambulatory Visit (INDEPENDENT_AMBULATORY_CARE_PROVIDER_SITE_OTHER): Payer: 59 | Admitting: Adult Health

## 2023-06-01 ENCOUNTER — Encounter (INDEPENDENT_AMBULATORY_CARE_PROVIDER_SITE_OTHER): Payer: Self-pay | Admitting: Adult Health

## 2023-06-01 VITALS — BP 137/83 | HR 98 | Temp 98.7°F | Ht 68.0 in | Wt 366.0 lb

## 2023-06-01 DIAGNOSIS — E782 Mixed hyperlipidemia: Secondary | ICD-10-CM | POA: Diagnosis not present

## 2023-06-01 DIAGNOSIS — E88819 Insulin resistance, unspecified: Secondary | ICD-10-CM

## 2023-06-01 DIAGNOSIS — E669 Obesity, unspecified: Secondary | ICD-10-CM | POA: Diagnosis not present

## 2023-06-01 DIAGNOSIS — E559 Vitamin D deficiency, unspecified: Secondary | ICD-10-CM

## 2023-06-01 DIAGNOSIS — Z6841 Body Mass Index (BMI) 40.0 and over, adult: Secondary | ICD-10-CM

## 2023-06-01 NOTE — Progress Notes (Signed)
WEIGHT SUMMARY AND BIOMETRICS  Vitals Temp: 98.7 F (37.1 C) BP: 137/83 Pulse Rate: 98 SpO2: 97 %   Anthropometric Measurements Height: 5\' 8"  (1.727 m) Weight: (!) 366 lb (166 kg) BMI (Calculated): 55.66 Weight at Last Visit: 366lb Weight Lost Since Last Visit: 0 Weight Gained Since Last Visit: 0 Starting Weight: 295lb Total Weight Loss (lbs): 0 lb (0 kg)   Body Composition  Body Fat %: 54.6 % Fat Mass (lbs): 200 lbs Muscle Mass (lbs): 157.8 lbs Total Body Water (lbs): 122 lbs Visceral Fat Rating : 20   Other Clinical Data Fasting: yes Labs: yes Today's Visit #: 66 Starting Date: 08/24/19    Chief Complaint:   OBESITY Ashley Acevedo is here to discuss her progress with her obesity treatment plan. She is on the keeping a food journal and adhering to recommended goals of 1250 calories and 85 protein and states she is following her eating plan approximately 50 % of the time. She states she is exercising - NEAT   Interim History:  Last dose of Ozempic therapy May 2024- insurance would not cover further refills  Reviewed [facility] results with pt: Muscle Mass: +9.4 lbs Adipose Mass: -9.8 lbs  Her home renovation of [place] is fully completed!  Of Note-  Latest Reference Range & Units 10/01/22 09:37  TSH 0.450 - 4.500 uIU/mL 2.720   Subjective:   1. Mixed hyperlipidemia Lipid Panel     Component Value Date/Time   CHOL 183 03/04/2022 1113   TRIG 123 03/04/2022 1113   HDL 53 03/04/2022 1113   CHOLHDL 3.5 03/04/2022 1113   LDLCALC 108 (H) 03/04/2022 1113   LABVLDL 22 03/04/2022 1113   .ascvd She denies tobacco/vape use. She denies CP with exertion.  2. Insulin resistance Last dose of Ozempic therapy May 2024- insurance would not cover further refills  Latest Reference Range & Units 08/15/21 08:31 03/04/22 11:13 11/05/22 09:19  INSULIN 2.6 - 24.9 uIU/mL 103.0 (H) 17.7 39.0 (H)  (H): Data is abnormally high  3. Vitamin D deficiency  Latest  Reference Range & Units 08/15/21 08:31 03/04/22 11:13 11/05/22 09:19  Vitamin D, 25-Hydroxy 30.0 - 100.0 ng/mL 24.9 (L) 27.1 (L) 17.7 (L)  (L): Data is abnormally low  Twice weekly Ergocalciferol was recently converted to weekly approximately 4 weeks ago She denies N/V/Muscle Weakness  Assessment/Plan:   1. Mixed hyperlipidemia Check Labs - Comprehensive metabolic panel - Lipid panel  2. Insulin resistance Check Labs - Hemoglobin A1c - Insulin, random - Vitamin B12  3. Vitamin D deficiency Check Labs - VITAMIN D 25 Hydroxy (Vit-D Deficiency, Fractures)  4. Obesity, current BMI 55.66  Ashley Acevedo is currently in the action stage of change. As such, her goal is to continue with weight loss efforts. She has agreed to keeping a food journal and adhering to recommended goals of 1250 calories and 85+ protein.   Exercise goals: All adults should avoid inactivity. Some physical activity is better than none, and adults who participate in any amount of physical activity gain some health benefits.  Behavioral modification strategies: increasing lean protein intake, decreasing simple carbohydrates, increasing vegetables, increasing water intake, no skipping meals, meal planning and cooking strategies, avoiding temptations, planning for success, and keeping a strict food journal.  Ashley Acevedo has agreed to follow-up with our clinic in 4 weeks. She was informed of the importance of frequent follow-up visits to maximize her success with intensive lifestyle modifications for her multiple health conditions.   Ashley Acevedo was informed we would  discuss her lab results at her next visit unless there is a critical issue that needs to be addressed sooner. Ashley Acevedo agreed to keep her next visit at the agreed upon time to discuss these results.  Objective:   Blood pressure 137/83, pulse 98, temperature 98.7 F (37.1 C), height 5\' 8"  (1.727 m), weight (!) 366 lb (166 kg), SpO2 97%. Body mass index is 55.65  kg/m.  General: Cooperative, alert, well developed, in no acute distress. HEENT: Conjunctivae and lids unremarkable. Cardiovascular: Regular rhythm.  Lungs: Normal work of breathing. Neurologic: No focal deficits.   Lab Results  Component Value Date   CREATININE 0.67 11/05/2022   BUN 12 11/05/2022   NA 141 11/05/2022   K 4.6 11/05/2022   CL 104 11/05/2022   CO2 23 11/05/2022   Lab Results  Component Value Date   ALT 29 11/05/2022   AST 18 11/05/2022   ALKPHOS 112 11/05/2022   BILITOT 0.3 11/05/2022   Lab Results  Component Value Date   HGBA1C 5.7 (H) 11/05/2022   HGBA1C 5.5 03/04/2022   HGBA1C 5.6 08/15/2021   HGBA1C 5.2 12/20/2020   HGBA1C 5.3 07/25/2020   Lab Results  Component Value Date   INSULIN 39.0 (H) 11/05/2022   INSULIN 17.7 03/04/2022   INSULIN 103.0 (H) 08/15/2021   INSULIN 19.7 12/20/2020   INSULIN 26.0 (H) 07/25/2020   Lab Results  Component Value Date   TSH 2.720 10/01/2022   Lab Results  Component Value Date   CHOL 183 03/04/2022   HDL 53 03/04/2022   LDLCALC 108 (H) 03/04/2022   TRIG 123 03/04/2022   CHOLHDL 3.5 03/04/2022   Lab Results  Component Value Date   VD25OH 17.7 (L) 11/05/2022   VD25OH 27.1 (L) 03/04/2022   VD25OH 24.9 (L) 08/15/2021   Lab Results  Component Value Date   WBC 8.1 12/20/2020   HGB 12.8 12/20/2020   HCT 39.4 12/20/2020   MCV 85 12/20/2020   PLT 350 12/20/2020   Lab Results  Component Value Date   IRON 76 12/20/2020   TIBC 416 12/20/2020   FERRITIN 23 12/20/2020   Attestation Statements:   Reviewed by clinician on day of visit: allergies, medications, problem list, medical history, surgical history, family history, social history, and previous encounter notes.  I have reviewed the above documentation for accuracy and completeness, and I agree with the above. -  Ashley Acevedo d. Ashley Kushner, NP-C

## 2023-06-02 LAB — LIPID PANEL
Chol/HDL Ratio: 4.2 ratio (ref 0.0–4.4)
Cholesterol, Total: 208 mg/dL — ABNORMAL HIGH (ref 100–199)
HDL: 49 mg/dL (ref >39)
LDL Chol Calc (NIH): 136 mg/dL — ABNORMAL HIGH (ref 0–99)
Triglycerides: 131 mg/dL (ref 0–149)
VLDL Cholesterol Cal: 23 mg/dL (ref 5–40)

## 2023-06-02 LAB — HEMOGLOBIN A1C
Est. average glucose Bld gHb Est-mCnc: 126 mg/dL
Hgb A1c MFr Bld: 6 % — ABNORMAL HIGH (ref 4.8–5.6)

## 2023-06-02 LAB — COMPREHENSIVE METABOLIC PANEL
ALT: 18 IU/L (ref 0–32)
AST: 16 IU/L (ref 0–40)
Albumin: 4 g/dL (ref 4.0–5.0)
Alkaline Phosphatase: 114 IU/L (ref 44–121)
BUN/Creatinine Ratio: 12 (ref 9–23)
BUN: 10 mg/dL (ref 6–20)
Bilirubin Total: 0.3 mg/dL (ref 0.0–1.2)
CO2: 23 mmol/L (ref 20–29)
Calcium: 9 mg/dL (ref 8.7–10.2)
Chloride: 104 mmol/L (ref 96–106)
Creatinine, Ser: 0.82 mg/dL (ref 0.57–1.00)
Globulin, Total: 2.7 g/dL (ref 1.5–4.5)
Glucose: 94 mg/dL (ref 70–99)
Potassium: 4.4 mmol/L (ref 3.5–5.2)
Sodium: 141 mmol/L (ref 134–144)
Total Protein: 6.7 g/dL (ref 6.0–8.5)
eGFR: 99 mL/min/{1.73_m2} (ref >59)

## 2023-06-02 LAB — VITAMIN B12: Vitamin B-12: 490 pg/mL (ref 232–1245)

## 2023-06-02 LAB — VITAMIN D 25 HYDROXY (VIT D DEFICIENCY, FRACTURES): Vit D, 25-Hydroxy: 37.5 ng/mL (ref 30.0–100.0)

## 2023-06-02 LAB — INSULIN, RANDOM: INSULIN: 29.6 u[IU]/mL — ABNORMAL HIGH (ref 2.6–24.9)

## 2023-07-01 ENCOUNTER — Ambulatory Visit (INDEPENDENT_AMBULATORY_CARE_PROVIDER_SITE_OTHER): Payer: 59 | Admitting: Adult Health

## 2023-07-01 ENCOUNTER — Encounter (INDEPENDENT_AMBULATORY_CARE_PROVIDER_SITE_OTHER): Payer: Self-pay | Admitting: Adult Health

## 2023-07-01 VITALS — BP 143/80 | HR 80 | Temp 98.1°F | Ht 68.0 in | Wt 370.0 lb

## 2023-07-01 DIAGNOSIS — E782 Mixed hyperlipidemia: Secondary | ICD-10-CM

## 2023-07-01 DIAGNOSIS — Z6841 Body Mass Index (BMI) 40.0 and over, adult: Secondary | ICD-10-CM | POA: Diagnosis not present

## 2023-07-01 DIAGNOSIS — E669 Obesity, unspecified: Secondary | ICD-10-CM | POA: Diagnosis not present

## 2023-07-01 DIAGNOSIS — E88819 Insulin resistance, unspecified: Secondary | ICD-10-CM | POA: Diagnosis not present

## 2023-07-01 NOTE — Progress Notes (Signed)
WEIGHT SUMMARY AND BIOMETRICS  Vitals Temp: 98.1 F (36.7 C) BP: (!) 143/80 Pulse Rate: 80 SpO2: 98 %   Anthropometric Measurements Height: 5\' 8"  (1.727 m) Weight: (!) 370 lb (167.8 kg) BMI (Calculated): 56.27 Weight at Last Visit: 366lb Weight Lost Since Last Visit: 0 Weight Gained Since Last Visit: 4lb Starting Weight: 295lb Total Weight Loss (lbs): 0 lb (0 kg)   Body Composition  Body Fat %: 57 % Fat Mass (lbs): 211.4 lbs Muscle Mass (lbs): 151.4 lbs Total Body Water (lbs): 121.4 lbs Visceral Fat Rating : 21   Other Clinical Data Fasting: no Labs: no Today's Visit #: 46 Starting Date: 08/24/19    Chief Complaint:   OBESITY Ashley Acevedo is here to discuss her progress with her obesity treatment plan. She is on the keeping a food journal and adhering to recommended goals of 1250 calories and 85 protein and states she is following her eating plan approximately 75 % of the time. She states she is exercising Brisk Walking 30 minutes 3-4 times per week.   Interim History:  [name] just returned from 2nd Anniversary trip to [place], [place]. They walked frequently and enjoyed local cuisine.  Her current insurance will not cover AOMs She was previously on [place], Saxenda, [place], and Mounjaro injections. She tolerated GLP-1 and GIP/GLP-1 therapy well and would welcome the opportunity to restart weekly injections.  Her husbands insurance may cover [place] and/or Zepbound- he is covered under [employer]  Subjective:   1. Mixed hyperlipidemia Discussed Labs Lipid Panel     Component Value Date/Time   CHOL 208 (H) 06/01/2023 0942   TRIG 131 06/01/2023 0942   HDL 49 06/01/2023 0942   CHOLHDL 4.2 06/01/2023 0942   LDLCALC 136 (H) 06/01/2023 0942   LABVLDL 23 06/01/2023 0942    Total and LDL both increased from previous check She denies CP with exertion She denies tobacco/vape use  2. Insulin resistance Discussed Labs  Latest Reference  Range & Units 08/15/21 08:31 03/04/22 11:13 11/05/22 09:19 06/01/23 09:42  INSULIN 2.6 - 24.9 uIU/mL 103.0 (H) 17.7 39.0 (H) 29.6 (H)  (H): Data is abnormally high  Lab Results  Component Value Date   HGBA1C 6.0 (H) 06/01/2023   HGBA1C 5.7 (H) 11/05/2022   HGBA1C 5.5 03/04/2022    A1c worsening Insulin slightly improved, however still well above goal  She denies family hx of MEN 2 or MTC She denies personal hx of pancreatitis  Assessment/Plan:   1. Mixed hyperlipidemia Continue to reduce saturated fat and increase daily walking  2. Insulin resistance Continue to increase protein and limit sugar/CHO  3. Obesity, current BMI 56.27  [name] is currently in the action stage of change. As such, her goal is to continue with weight loss efforts. She has agreed to keeping a food journal and adhering to recommended goals of 1250 calories and 85 protein.   Exercise goals: For substantial health benefits, adults should do at least 150 minutes (2 hours and 30 minutes) a week of moderate-intensity, or 75 minutes (1 hour and 15 minutes) a week of vigorous-intensity aerobic physical activity, or an equivalent combination of moderate- and vigorous-intensity aerobic activity. Aerobic activity should be performed in episodes of at least 10 minutes, and preferably, it should be spread throughout the week.  Behavioral modification strategies: increasing lean protein intake, decreasing simple carbohydrates, increasing vegetables, increasing water intake, no skipping meals, meal planning and cooking strategies, and planning for success.  Genecis has agreed to  follow-up with our clinic in 4 weeks. She was informed of the importance of frequent follow-up visits to maximize her success with intensive lifestyle modifications for her multiple health conditions.   Objective:   Blood pressure (!) 143/80, pulse 80, temperature 98.1 F (36.7 C), height 5\' 8"  (1.727 m), weight (!) 370 lb (167.8 kg), SpO2 98%. Body  mass index is 56.26 kg/m.  General: Cooperative, alert, well developed, in no acute distress. HEENT: Conjunctivae and lids unremarkable. Cardiovascular: Regular rhythm.  Lungs: Normal work of breathing. Neurologic: No focal deficits.   Lab Results  Component Value Date   CREATININE 0.82 06/01/2023   BUN 10 06/01/2023   NA 141 06/01/2023   K 4.4 06/01/2023   CL 104 06/01/2023   CO2 23 06/01/2023   Lab Results  Component Value Date   ALT 18 06/01/2023   AST 16 06/01/2023   ALKPHOS 114 06/01/2023   BILITOT 0.3 06/01/2023   Lab Results  Component Value Date   HGBA1C 6.0 (H) 06/01/2023   HGBA1C 5.7 (H) 11/05/2022   HGBA1C 5.5 03/04/2022   HGBA1C 5.6 08/15/2021   HGBA1C 5.2 12/20/2020   Lab Results  Component Value Date   INSULIN 29.6 (H) 06/01/2023   INSULIN 39.0 (H) 11/05/2022   INSULIN 17.7 03/04/2022   INSULIN 103.0 (H) 08/15/2021   INSULIN 19.7 12/20/2020   Lab Results  Component Value Date   TSH 2.720 10/01/2022   Lab Results  Component Value Date   CHOL 208 (H) 06/01/2023   HDL 49 06/01/2023   LDLCALC 136 (H) 06/01/2023   TRIG 131 06/01/2023   CHOLHDL 4.2 06/01/2023   Lab Results  Component Value Date   VD25OH 37.5 06/01/2023   VD25OH 17.7 (L) 11/05/2022   VD25OH 27.1 (L) 03/04/2022   Lab Results  Component Value Date   WBC 8.1 12/20/2020   HGB 12.8 12/20/2020   HCT 39.4 12/20/2020   MCV 85 12/20/2020   PLT 350 12/20/2020   Lab Results  Component Value Date   IRON 76 12/20/2020   TIBC 416 12/20/2020   FERRITIN 23 12/20/2020   Attestation Statements:   Reviewed by clinician on day of visit: allergies, medications, problem list, medical history, surgical history, family history, social history, and previous encounter notes.  I have reviewed the above documentation for accuracy and completeness, and I agree with the above. -  Zahria Ding d. Alfredo Spong, NP-C

## 2023-07-28 ENCOUNTER — Ambulatory Visit (INDEPENDENT_AMBULATORY_CARE_PROVIDER_SITE_OTHER): Payer: 59 | Admitting: Adult Health

## 2023-07-28 ENCOUNTER — Encounter (INDEPENDENT_AMBULATORY_CARE_PROVIDER_SITE_OTHER): Payer: Self-pay | Admitting: Adult Health

## 2023-07-28 VITALS — BP 134/84 | HR 92 | Temp 97.8°F | Ht 68.0 in | Wt 371.0 lb

## 2023-07-28 DIAGNOSIS — K219 Gastro-esophageal reflux disease without esophagitis: Secondary | ICD-10-CM

## 2023-07-28 DIAGNOSIS — F5089 Other specified eating disorder: Secondary | ICD-10-CM | POA: Diagnosis not present

## 2023-07-28 DIAGNOSIS — E669 Obesity, unspecified: Secondary | ICD-10-CM

## 2023-07-28 DIAGNOSIS — E559 Vitamin D deficiency, unspecified: Secondary | ICD-10-CM | POA: Diagnosis not present

## 2023-07-28 DIAGNOSIS — E88819 Insulin resistance, unspecified: Secondary | ICD-10-CM

## 2023-07-28 DIAGNOSIS — Z6841 Body Mass Index (BMI) 40.0 and over, adult: Secondary | ICD-10-CM

## 2023-07-28 DIAGNOSIS — F3289 Other specified depressive episodes: Secondary | ICD-10-CM

## 2023-07-28 MED ORDER — VITAMIN D (ERGOCALCIFEROL) 1.25 MG (50000 UNIT) PO CAPS
50000.0000 [IU] | ORAL_CAPSULE | ORAL | 0 refills | Status: DC
Start: 2023-07-28 — End: 2023-08-25

## 2023-07-28 MED ORDER — PANTOPRAZOLE SODIUM 40 MG PO TBEC
DELAYED_RELEASE_TABLET | ORAL | 0 refills | Status: DC
Start: 2023-07-28 — End: 2023-10-27

## 2023-07-28 MED ORDER — ESCITALOPRAM OXALATE 10 MG PO TABS
10.0000 mg | ORAL_TABLET | Freq: Every day | ORAL | 0 refills | Status: DC
Start: 2023-07-28 — End: 2023-11-10

## 2023-07-28 MED ORDER — METFORMIN HCL 500 MG PO TABS: ORAL_TABLET | ORAL | 0 refills | Status: DC

## 2023-07-28 NOTE — Progress Notes (Signed)
WEIGHT SUMMARY AND BIOMETRICS  Vitals Temp: 97.8 F (36.6 C) BP: 134/84 Pulse Rate: 92 SpO2: 96 %   Anthropometric Measurements Height: 5\' 8"  (1.727 m) Weight: (!) 371 lb (168.3 kg) BMI (Calculated): 56.42 Weight at Last Visit: 370lb Weight Lost Since Last Visit: 0 Weight Gained Since Last Visit: 1lb Starting Weight: 295lb Total Weight Loss (lbs): 0 lb (0 kg)   Body Composition  Body Fat %: 57.1 % Fat Mass (lbs): 212 lbs Muscle Mass (lbs): 151.4 lbs Total Body Water (lbs): 120 lbs Visceral Fat Rating : 21   Other Clinical Data Fasting: no Labs: no Today's Visit #: 63 Starting Date: 08/24/19    Chief Complaint:   OBESITY Ashley Acevedo is here to discuss her progress with her obesity treatment plan. She is on the keeping a food journal and adhering to recommended goals of 1250 calories and 85+ protein and states she is following her eating plan approximately 60 % of the time. She states she is not currently exercising due to acute L ankle pain   Interim History: Ashley Acevedo reports following her prescribed meal plan rather consistently during weekdays and is challenged to track intake over weekends- which are often filled with celebrations (Baby, Wedding showers, etc).  Exercise-none due to worsening L ankle bursitis and subsequent pain  She had been icing and resting ankle in evenings  Subjective:   1. Insulin resistance  Latest Reference Range & Units 06/01/23 09:42  eGFR >59 mL/min/1.73 99    Latest Reference Range & Units 03/04/22 11:13 11/05/22 09:19 06/01/23 09:42  INSULIN 2.6 - 24.9 uIU/mL 17.7 39.0 (H) 29.6 (H)  (H): Data is abnormally high  Her insurance will not cover AOMs It is open enrollment- she and her husband are researching if either of their plans will offer AOM coverage in 2025 Discussed risks/benefits of Metformin therapy- she is agreeable to start low dose tx  2. Vitamin D deficiency  Latest Reference Range & Units 11/05/22 09:19  06/01/23 09:42  Vitamin D, 25-Hydroxy 30.0 - 100.0 ng/mL 17.7 (L) 37.5  (L): Data is abnormally low 3. Gastroesophageal reflux disease, unspecified whether esophagitis present  She reports dyspepsia well controlled by avoiding known trigger foods and daily Protonix 40mg   4. Other depression, with emotional eating She report stable mood, denies SI/HI She is on daily Lexapro 10mg   Assessment/Plan:   1. Insulin resistance Start metFORMIN (GLUCOPHAGE) 500 MG tablet 1/2 tab daily with meal for one week, then increase to 1 tab daily with meal Dispense: 30 tablet, Refills: 0 ordered   2. Vitamin D deficiency Refill - Vitamin D, Ergocalciferol, (DRISDOL) 1.25 MG (50000 UNIT) CAPS capsule; Take 1 capsule (50,000 Units total) by mouth every 3 (three) days. Take 1 cap every 7 days  Dispense: 4 capsule; Refill: 0  3. Gastroesophageal reflux disease, unspecified whether esophagitis present Refill - pantoprazole (PROTONIX) 40 MG tablet; TAKE 1 TABLET(40 MG) BY MOUTH DAILY  Dispense: 90 tablet; Refill: 0  4. Other depression, with emotional eating Refill - escitalopram (LEXAPRO) 10 MG tablet; Take 1 tablet (10 mg total) by mouth daily.  Dispense: 90 tablet; Refill: 0  5. Obesity, current BMI 56.4  Ashley Acevedo is currently in the action stage of change. As such, her goal is to continue with weight loss efforts. She has agreed to keeping a food journal and adhering to recommended goals of 1250 calories and 85+ protein.   Exercise goals: All adults should avoid inactivity. Some physical activity is better than none,  and adults who participate in any amount of physical activity gain some health benefits.  Behavioral modification strategies: increasing lean protein intake, decreasing simple carbohydrates, increasing vegetables, increasing water intake, decreasing liquid calories, meal planning and cooking strategies, keeping healthy foods in the home, ways to avoid boredom eating, ways to avoid night time  snacking, and planning for success.  Ashley Acevedo has agreed to follow-up with our clinic in 4 weeks. She was informed of the importance of frequent follow-up visits to maximize her success with intensive lifestyle modifications for her multiple health conditions.   Objective:   Blood pressure 134/84, pulse 92, temperature 97.8 F (36.6 C), height 5\' 8"  (1.727 m), weight (!) 371 lb (168.3 kg), SpO2 96%. Body mass index is 56.41 kg/m.  General: Cooperative, alert, well developed, in no acute distress. HEENT: Conjunctivae and lids unremarkable. Cardiovascular: Regular rhythm.  Lungs: Normal work of breathing. Neurologic: No focal deficits.   Lab Results  Component Value Date   CREATININE 0.82 06/01/2023   BUN 10 06/01/2023   NA 141 06/01/2023   K 4.4 06/01/2023   CL 104 06/01/2023   CO2 23 06/01/2023   Lab Results  Component Value Date   ALT 18 06/01/2023   AST 16 06/01/2023   ALKPHOS 114 06/01/2023   BILITOT 0.3 06/01/2023   Lab Results  Component Value Date   HGBA1C 6.0 (H) 06/01/2023   HGBA1C 5.7 (H) 11/05/2022   HGBA1C 5.5 03/04/2022   HGBA1C 5.6 08/15/2021   HGBA1C 5.2 12/20/2020   Lab Results  Component Value Date   INSULIN 29.6 (H) 06/01/2023   INSULIN 39.0 (H) 11/05/2022   INSULIN 17.7 03/04/2022   INSULIN 103.0 (H) 08/15/2021   INSULIN 19.7 12/20/2020   Lab Results  Component Value Date   TSH 2.720 10/01/2022   Lab Results  Component Value Date   CHOL 208 (H) 06/01/2023   HDL 49 06/01/2023   LDLCALC 136 (H) 06/01/2023   TRIG 131 06/01/2023   CHOLHDL 4.2 06/01/2023   Lab Results  Component Value Date   VD25OH 37.5 06/01/2023   VD25OH 17.7 (L) 11/05/2022   VD25OH 27.1 (L) 03/04/2022   Lab Results  Component Value Date   WBC 8.1 12/20/2020   HGB 12.8 12/20/2020   HCT 39.4 12/20/2020   MCV 85 12/20/2020   PLT 350 12/20/2020   Lab Results  Component Value Date   IRON 76 12/20/2020   TIBC 416 12/20/2020   FERRITIN 23 12/20/2020     Attestation Statements:   Reviewed by clinician on day of visit: allergies, medications, problem list, medical history, surgical history, family history, social history, and previous encounter notes.  I have reviewed the above documentation for accuracy and completeness, and I agree with the above. -  Leanza Shepperson d. Diara Chaudhari, NP-C

## 2023-08-22 ENCOUNTER — Other Ambulatory Visit (INDEPENDENT_AMBULATORY_CARE_PROVIDER_SITE_OTHER): Payer: Self-pay | Admitting: Adult Health

## 2023-08-22 DIAGNOSIS — E559 Vitamin D deficiency, unspecified: Secondary | ICD-10-CM

## 2023-08-24 ENCOUNTER — Other Ambulatory Visit (INDEPENDENT_AMBULATORY_CARE_PROVIDER_SITE_OTHER): Payer: Self-pay | Admitting: Adult Health

## 2023-08-25 ENCOUNTER — Encounter (INDEPENDENT_AMBULATORY_CARE_PROVIDER_SITE_OTHER): Payer: Self-pay | Admitting: Adult Health

## 2023-08-25 ENCOUNTER — Ambulatory Visit (INDEPENDENT_AMBULATORY_CARE_PROVIDER_SITE_OTHER): Payer: 59 | Admitting: Adult Health

## 2023-08-25 VITALS — BP 145/78 | HR 91 | Temp 98.1°F | Ht 68.0 in | Wt 375.0 lb

## 2023-08-25 DIAGNOSIS — E669 Obesity, unspecified: Secondary | ICD-10-CM | POA: Diagnosis not present

## 2023-08-25 DIAGNOSIS — E559 Vitamin D deficiency, unspecified: Secondary | ICD-10-CM | POA: Diagnosis not present

## 2023-08-25 DIAGNOSIS — E66813 Obesity, class 3: Secondary | ICD-10-CM

## 2023-08-25 DIAGNOSIS — R030 Elevated blood-pressure reading, without diagnosis of hypertension: Secondary | ICD-10-CM

## 2023-08-25 DIAGNOSIS — E88819 Insulin resistance, unspecified: Secondary | ICD-10-CM | POA: Diagnosis not present

## 2023-08-25 DIAGNOSIS — Z6841 Body Mass Index (BMI) 40.0 and over, adult: Secondary | ICD-10-CM

## 2023-08-25 MED ORDER — VITAMIN D (ERGOCALCIFEROL) 1.25 MG (50000 UNIT) PO CAPS: 50000.0000 [IU] | ORAL_CAPSULE | ORAL | 0 refills | Status: DC

## 2023-08-25 MED ORDER — METFORMIN HCL 500 MG PO TABS: ORAL_TABLET | ORAL | 0 refills | Status: DC

## 2023-08-25 NOTE — Progress Notes (Signed)
WEIGHT SUMMARY AND BIOMETRICS  Vitals Temp: 98.1 F (36.7 C) BP: (!) 145/78 Pulse Rate: 91 SpO2: 98 %   Anthropometric Measurements Height: 5\' 8"  (1.727 m) Weight: (!) 375 lb (170.1 kg) BMI (Calculated): 57.03 Weight at Last Visit: 371lb Weight Lost Since Last Visit: 0 Weight Gained Since Last Visit: 4lb Starting Weight: 295lb Total Weight Loss (lbs): 0 lb (0 kg)   Body Composition  Body Fat %: 57.1 % Fat Mass (lbs): 214.2 lbs Muscle Mass (lbs): 153 lbs Total Body Water (lbs): 120.6 lbs Visceral Fat Rating : 21   Other Clinical Data Fasting: no Labs: no Today's Visit #: 45 Starting Date: 08/24/19    Chief Complaint:   OBESITY Ashley Acevedo is here to discuss her progress with her obesity treatment plan. She is on the keeping a food journal and adhering to recommended goals of 1250 calories and 85+ protein and states she is following her eating plan approximately 60 % of the time. She states she is exercising Walking and NEAT Activities.   Interim History:  Ashley Acevedo experienced dbl otitis media and tonsilitis  She was treated with Amoixicllin BID for 10 days She completed course 08/16/2023 08/17/2023 she started Metformin 500mg  1/2 tab with breakfast. She reports "queasiness" and loose stools.  She will convert to her husband's [employer] after [employer]. New plan will likely cover AOMS (Wegovy, Zepbound)  She stopped Yaz 3/0.02mg  > 3months ago She and her husband are trying to conceive Discussed GLP-1, GIP-GLP1 therapy is contraindicated with pregnancy   Subjective:   1. Elevated blood pressure reading BP readings at OV- above goal She denies CP with exertion She denies tobacco/vape use  2. Vitamin D deficiency  Latest Reference Range & Units 11/05/22 09:19 06/01/23 09:42  Vitamin D, 25-Hydroxy 30.0 - 100.0 ng/mL 17.7 (L) 37.5  (L): Data is abnormally low  She is on bi-weekly Ergocalciferol- denies N/V/Muscle Weakness  3. Insulin  resistance  Latest Reference Range & Units 03/04/22 11:13 11/05/22 09:19 06/01/23 09:42  INSULIN 2.6 - 24.9 uIU/mL 17.7 39.0 (H) 29.6 (H)  (H): Data is abnormally high Lab Results  Component Value Date   HGBA1C 6.0 (H) 06/01/2023   HGBA1C 5.7 (H) 11/05/2022   HGBA1C 5.5 03/04/2022    08/17/2023 she started Metformin 500mg  1/2 tab with breakfast. She reports "queasiness" and loose stools.  Assessment/Plan:   1. Elevated blood pressure reading Limit Na+ Increase water and protein intake Monitor readings  2. Vitamin D deficiency Refill Vitamin D, Ergocalciferol, (DRISDOL) 1.25 MG (50000 UNIT) CAPS capsule Take 1 capsule (50,000 Units total) by mouth every 3 (three) days. Take 1 cap every 7 days Dispense: 4 capsule, Refills: 0 ordered    3. Insulin resistance Refill and increase (after 08/31/2023) Recommend another week on 250mg  dose to allow body to adjust to Rx  metFORMIN (GLUCOPHAGE) 500 MG tablet 1 tab daily with meal Dispense: 30 tablet, Refills: 0 ordered    4. Obesity, current BMI 57.03  Ashley Acevedo is currently in the action stage of change. As such, her goal is to continue with weight loss efforts. She has agreed to keeping a food journal and adhering to recommended goals of 1250 calories and 85+ protein.   Exercise goals: All adults should avoid inactivity. Some physical activity is better than none, and adults who participate in any amount of physical activity gain some health benefits. Adults should also include muscle-strengthening activities that involve all major muscle groups on 2 or more days  a week.  Behavioral modification strategies: increasing lean protein intake, decreasing simple carbohydrates, increasing vegetables, increasing water intake, decreasing eating out, no skipping meals, meal planning and cooking strategies, keeping healthy foods in the home, holiday eating strategies , planning for success, and keeping a strict food journal.  Ashley Acevedo has agreed to  follow-up with our clinic in 4 weeks. She was informed of the importance of frequent follow-up visits to maximize her success with intensive lifestyle modifications for her multiple health conditions.   Check Fasting Labs at next OV  Objective:   Blood pressure (!) 145/78, pulse 91, temperature 98.1 F (36.7 C), height 5\' 8"  (1.727 m), weight (!) 375 lb (170.1 kg), last menstrual period 08/10/2023, SpO2 98%. Body mass index is 57.02 kg/m.  General: Cooperative, alert, well developed, in no acute distress. HEENT: Conjunctivae and lids unremarkable. Cardiovascular: Regular rhythm.  Lungs: Normal work of breathing. Neurologic: No focal deficits.   Lab Results  Component Value Date   CREATININE 0.82 06/01/2023   BUN 10 06/01/2023   NA 141 06/01/2023   K 4.4 06/01/2023   CL 104 06/01/2023   CO2 23 06/01/2023   Lab Results  Component Value Date   ALT 18 06/01/2023   AST 16 06/01/2023   ALKPHOS 114 06/01/2023   BILITOT 0.3 06/01/2023   Lab Results  Component Value Date   HGBA1C 6.0 (H) 06/01/2023   HGBA1C 5.7 (H) 11/05/2022   HGBA1C 5.5 03/04/2022   HGBA1C 5.6 08/15/2021   HGBA1C 5.2 12/20/2020   Lab Results  Component Value Date   INSULIN 29.6 (H) 06/01/2023   INSULIN 39.0 (H) 11/05/2022   INSULIN 17.7 03/04/2022   INSULIN 103.0 (H) 08/15/2021   INSULIN 19.7 12/20/2020   Lab Results  Component Value Date   TSH 2.720 10/01/2022   Lab Results  Component Value Date   CHOL 208 (H) 06/01/2023   HDL 49 06/01/2023   LDLCALC 136 (H) 06/01/2023   TRIG 131 06/01/2023   CHOLHDL 4.2 06/01/2023   Lab Results  Component Value Date   VD25OH 37.5 06/01/2023   VD25OH 17.7 (L) 11/05/2022   VD25OH 27.1 (L) 03/04/2022   Lab Results  Component Value Date   WBC 8.1 12/20/2020   HGB 12.8 12/20/2020   HCT 39.4 12/20/2020   MCV 85 12/20/2020   PLT 350 12/20/2020   Lab Results  Component Value Date   IRON 76 12/20/2020   TIBC 416 12/20/2020   FERRITIN 23 12/20/2020     Attestation Statements:   Reviewed by clinician on day of visit: allergies, medications, problem list, medical history, surgical history, family history, social history, and previous encounter notes.  I have reviewed the above documentation for accuracy and completeness, and I agree with the above. -  Ashley Craigo d. Hessie Varone, NP-C

## 2023-08-26 ENCOUNTER — Other Ambulatory Visit (INDEPENDENT_AMBULATORY_CARE_PROVIDER_SITE_OTHER): Payer: Self-pay | Admitting: Adult Health

## 2023-08-26 DIAGNOSIS — E559 Vitamin D deficiency, unspecified: Secondary | ICD-10-CM

## 2023-08-26 MED ORDER — VITAMIN D (ERGOCALCIFEROL) 1.25 MG (50000 UNIT) PO CAPS: 50000.0000 [IU] | ORAL_CAPSULE | ORAL | 0 refills | Status: DC

## 2023-09-12 ENCOUNTER — Other Ambulatory Visit (INDEPENDENT_AMBULATORY_CARE_PROVIDER_SITE_OTHER): Payer: Self-pay | Admitting: Adult Health

## 2023-09-12 DIAGNOSIS — E559 Vitamin D deficiency, unspecified: Secondary | ICD-10-CM

## 2023-09-29 ENCOUNTER — Other Ambulatory Visit (INDEPENDENT_AMBULATORY_CARE_PROVIDER_SITE_OTHER): Payer: Self-pay | Admitting: Adult Health

## 2023-09-29 ENCOUNTER — Ambulatory Visit (INDEPENDENT_AMBULATORY_CARE_PROVIDER_SITE_OTHER): Payer: 59 | Admitting: Adult Health

## 2023-10-21 DIAGNOSIS — Z3201 Encounter for pregnancy test, result positive: Secondary | ICD-10-CM | POA: Diagnosis not present

## 2023-10-27 ENCOUNTER — Encounter (INDEPENDENT_AMBULATORY_CARE_PROVIDER_SITE_OTHER): Payer: Self-pay | Admitting: Adult Health

## 2023-10-27 ENCOUNTER — Ambulatory Visit (INDEPENDENT_AMBULATORY_CARE_PROVIDER_SITE_OTHER): Payer: 59 | Admitting: Adult Health

## 2023-10-27 VITALS — BP 136/78 | HR 92 | Temp 98.3°F | Ht 68.0 in | Wt 379.0 lb

## 2023-10-27 DIAGNOSIS — E669 Obesity, unspecified: Secondary | ICD-10-CM | POA: Diagnosis not present

## 2023-10-27 DIAGNOSIS — K219 Gastro-esophageal reflux disease without esophagitis: Secondary | ICD-10-CM

## 2023-10-27 DIAGNOSIS — E559 Vitamin D deficiency, unspecified: Secondary | ICD-10-CM

## 2023-10-27 DIAGNOSIS — Z3A01 Less than 8 weeks gestation of pregnancy: Secondary | ICD-10-CM

## 2023-10-27 DIAGNOSIS — E88819 Insulin resistance, unspecified: Secondary | ICD-10-CM | POA: Diagnosis not present

## 2023-10-27 DIAGNOSIS — Z6841 Body Mass Index (BMI) 40.0 and over, adult: Secondary | ICD-10-CM

## 2023-10-27 MED ORDER — PANTOPRAZOLE SODIUM 40 MG PO TBEC: DELAYED_RELEASE_TABLET | ORAL | 0 refills | Status: DC

## 2023-10-27 MED ORDER — METFORMIN HCL 500 MG PO TABS: ORAL_TABLET | ORAL | 0 refills | Status: DC

## 2023-10-28 ENCOUNTER — Other Ambulatory Visit (INDEPENDENT_AMBULATORY_CARE_PROVIDER_SITE_OTHER): Payer: Self-pay | Admitting: Adult Health

## 2023-10-28 DIAGNOSIS — F3289 Other specified depressive episodes: Secondary | ICD-10-CM

## 2023-10-28 NOTE — Progress Notes (Signed)
 WEIGHT SUMMARY AND BIOMETRICS  Vitals Temp: 98.3 F (36.8 C) BP: 136/78 Pulse Rate: 92 SpO2: 97 %   Anthropometric Measurements Height: 5' 8 (1.727 m) Weight: (!) 379 lb (171.9 kg) BMI (Calculated): 57.64 Weight at Last Visit: 375lb Weight Lost Since Last Visit: 0 Weight Gained Since Last Visit: 4lb Starting Weight: 295lb Total Weight Loss (lbs): 0 lb (0 kg)   Body Composition  Body Fat %: 57.5 % Fat Mass (lbs): 218.2 lbs Muscle Mass (lbs): 153.2 lbs Total Body Water  (lbs): 120.4 lbs Visceral Fat Rating : 22   Other Clinical Data Fasting: no Labs: yes Today's Visit #: 52 Starting Date: 08/24/19    Chief Complaint:   OBESITY Ashley Acevedo is here to discuss her progress with her obesity treatment plan. She is on the keeping a food journal and adhering to recommended goals of 1250 calories and 85+ protein and states she is following her eating plan approximately 50 % of the time. She states she is exercising Walking 30-45 minutes 3-4 times per week.   Interim History:  Ashley Acevedo is newly pregnant!!!  She and her husband shared the happy news to their families Christmas morning.  Exercise-brisk walking at least 3-4 times per week   Pregnancy Goals Discussed  Hx of Chiari Malformation- Cesarean delivery under regional or general anesthesia or by choosing assisted vaginal delivery under neuraxial blockade.   Subjective:   1. Less than [redacted] weeks gestation of pregnancy Home Pregnancy test + Confirmatory tests x 2, one at [facility] and one at [facility] She endorses nausea without vomiting Reviewed her medications managed by [facility]: Metformin - Pregnancy Class B Protonix - Pregnancy Class B Lexapro - Pregnancy Class C Ergocalciferol - Pregnancy Class C  2. Insulin  resistance Lab Results  Component Value Date   HGBA1C 6.0 (H) 06/01/2023   HGBA1C 5.7 (H) 11/05/2022   HGBA1C 5.5 03/04/2022     Latest Reference Range & Units 03/04/22 11:13 11/05/22 09:19 06/01/23 09:42  INSULIN   2.6 - 24.9 uIU/mL 17.7 39.0 (H) 29.6 (H)  (H): Data is abnormally high  She is on daily Metformin  500mg - denies GI upset  3. Gastroesophageal reflux disease, unspecified whether esophagitis present She stopped protonix  for one week and experienced profound worsening of chronic GERD.  4. Vit D Def Discussed that she needs to stop Ergocalciferol - her Prenantal will provide all that she needs.  Assessment/Plan:   1. Less than [redacted] weeks gestation of pregnancy Discussed goals during her pregnancy, in terms of weight and overall health- ie: avoid gestational diabetes Due to her hx of Chiari Malformation- Cesarean delivery under regional or general anesthesia or by choosing assisted vaginal delivery under neuraxial blockade.   Confirm with OB at her next OV what medications are safe to continue during pregnancy.  2. Insulin  resistance (Primary) Continue Metformin , confirm with OB at next OV- okay to continue  3. Gastroesophageal reflux disease, unspecified whether esophagitis present Refill - pantoprazole  (PROTONIX ) 40 MG tablet; TAKE 1 TABLET(40 MG) BY MOUTH DAILY  Dispense: 90 tablet; Refill: 0  4. Vit D Def STOP ERGOCALCIFEROL - pt verbalized understanding and agrrement  Obesity, current BMI 57.64  Ashley Acevedo is currently in the action stage of change. As such, her goal is to maintain weight for now. She has agreed to practicing portion control and making smarter food choices, such as increasing vegetables and decreasing simple carbohydrates.   Exercise goals: For substantial health benefits, adults should do at least 150 minutes (2 hours and 30 minutes) a week of moderate-intensity,  or 75 minutes (1 hour and 15 minutes) a week of vigorous-intensity aerobic physical activity, or an equivalent combination of moderate- and vigorous-intensity aerobic activity. Aerobic activity should be performed in episodes of at least 10 minutes, and preferably, it should be spread throughout the  week.  Behavioral modification strategies: increasing lean protein intake, decreasing simple carbohydrates, increasing vegetables, increasing water  intake, decreasing liquid calories, no skipping meals, meal planning and cooking strategies, keeping healthy foods in the home, ways to avoid boredom eating, avoiding temptations, and planning for success.  Ashley Acevedo has agreed to follow-up with our clinic in 4 weeks. She was informed of the importance of frequent follow-up visits to maximize her success with intensive lifestyle modifications for her multiple health conditions.   Objective:   Blood pressure 136/78, pulse 92, temperature 98.3 F (36.8 C), height 5' 8 (1.727 m), weight (!) 379 lb (171.9 kg), last menstrual period 09/07/2023, SpO2 97%. Body mass index is 57.63 kg/m.  General: Cooperative, alert, well developed, in no acute distress. HEENT: Conjunctivae and lids unremarkable. Cardiovascular: Regular rhythm.  Lungs: Normal work of breathing. Neurologic: No focal deficits.   Lab Results  Component Value Date   CREATININE 0.82 06/01/2023   BUN 10 06/01/2023   NA 141 06/01/2023   K 4.4 06/01/2023   CL 104 06/01/2023   CO2 23 06/01/2023   Lab Results  Component Value Date   ALT 18 06/01/2023   AST 16 06/01/2023   ALKPHOS 114 06/01/2023   BILITOT 0.3 06/01/2023   Lab Results  Component Value Date   HGBA1C 6.0 (H) 06/01/2023   HGBA1C 5.7 (H) 11/05/2022   HGBA1C 5.5 03/04/2022   HGBA1C 5.6 08/15/2021   HGBA1C 5.2 12/20/2020   Lab Results  Component Value Date   INSULIN  29.6 (H) 06/01/2023   INSULIN  39.0 (H) 11/05/2022   INSULIN  17.7 03/04/2022   INSULIN  103.0 (H) 08/15/2021   INSULIN  19.7 12/20/2020   Lab Results  Component Value Date   TSH 2.720 10/01/2022   Lab Results  Component Value Date   CHOL 208 (H) 06/01/2023   HDL 49 06/01/2023   LDLCALC 136 (H) 06/01/2023   TRIG 131 06/01/2023   CHOLHDL 4.2 06/01/2023   Lab Results  Component Value Date    VD25OH 37.5 06/01/2023   VD25OH 17.7 (L) 11/05/2022   VD25OH 27.1 (L) 03/04/2022   Lab Results  Component Value Date   WBC 8.1 12/20/2020   HGB 12.8 12/20/2020   HCT 39.4 12/20/2020   MCV 85 12/20/2020   PLT 350 12/20/2020   Lab Results  Component Value Date   IRON 76 12/20/2020   TIBC 416 12/20/2020   FERRITIN 23 12/20/2020   Attestation Statements:   Reviewed by clinician on day of visit: allergies, medications, problem list, medical history, surgical history, family history, social history, and previous encounter notes.  I have reviewed the above documentation for accuracy and completeness, and I agree with the above. -  Tatiana Courter d. Maly Lemarr, NP-C

## 2023-11-09 ENCOUNTER — Other Ambulatory Visit (INDEPENDENT_AMBULATORY_CARE_PROVIDER_SITE_OTHER): Payer: Self-pay | Admitting: Adult Health

## 2023-11-09 ENCOUNTER — Encounter (INDEPENDENT_AMBULATORY_CARE_PROVIDER_SITE_OTHER): Payer: Self-pay | Admitting: Adult Health

## 2023-11-09 DIAGNOSIS — F3289 Other specified depressive episodes: Secondary | ICD-10-CM

## 2023-11-10 ENCOUNTER — Encounter (INDEPENDENT_AMBULATORY_CARE_PROVIDER_SITE_OTHER): Payer: Self-pay

## 2023-11-10 ENCOUNTER — Other Ambulatory Visit (INDEPENDENT_AMBULATORY_CARE_PROVIDER_SITE_OTHER): Payer: Self-pay

## 2023-11-10 DIAGNOSIS — F3289 Other specified depressive episodes: Secondary | ICD-10-CM

## 2023-11-10 DIAGNOSIS — Z3480 Encounter for supervision of other normal pregnancy, unspecified trimester: Secondary | ICD-10-CM | POA: Diagnosis not present

## 2023-11-10 DIAGNOSIS — O26841 Uterine size-date discrepancy, first trimester: Secondary | ICD-10-CM | POA: Diagnosis not present

## 2023-11-10 DIAGNOSIS — Z3201 Encounter for pregnancy test, result positive: Secondary | ICD-10-CM | POA: Diagnosis not present

## 2023-11-10 DIAGNOSIS — Z113 Encounter for screening for infections with a predominantly sexual mode of transmission: Secondary | ICD-10-CM | POA: Diagnosis not present

## 2023-11-10 LAB — OB RESULTS CONSOLE RPR: RPR: NONREACTIVE

## 2023-11-10 LAB — OB RESULTS CONSOLE GC/CHLAMYDIA: Neisseria Gonorrhea: NEGATIVE

## 2023-11-10 MED ORDER — ESCITALOPRAM OXALATE 10 MG PO TABS: 10.0000 mg | ORAL_TABLET | Freq: Every day | ORAL | 0 refills | Status: DC

## 2023-11-18 ENCOUNTER — Ambulatory Visit (INDEPENDENT_AMBULATORY_CARE_PROVIDER_SITE_OTHER): Payer: BC Managed Care – PPO | Admitting: Adult Health

## 2023-11-23 ENCOUNTER — Telehealth: Payer: Self-pay | Admitting: Neurology

## 2023-11-23 ENCOUNTER — Other Ambulatory Visit (INDEPENDENT_AMBULATORY_CARE_PROVIDER_SITE_OTHER): Payer: Self-pay | Admitting: Adult Health

## 2023-11-23 NOTE — Telephone Encounter (Signed)
 The patient left a voice mail asking to make an appointment with Dr. Tresia Fruit to discuss Chiari I malformation and how to proceed while pregnant. I didn't see any openings in the next month, if you can see where you can fit her in and call her back please.

## 2023-11-23 NOTE — Telephone Encounter (Signed)
 In my last note I told her to discuss with obgyn if she gets pregnant. I would discuss with obgyn first. If obgyn would liks us  to see her we can set her up with the next available appointment. She has a history of chiari and I have seen her for this before so she could see an NP if needed. But it does not appear that she is having new symptoms. No urgency to see her on asymptomatic/stable chiari in pregnancy so she can wait for an appointment as well. But again, did she discuss with obgyn?

## 2023-11-25 ENCOUNTER — Encounter (INDEPENDENT_AMBULATORY_CARE_PROVIDER_SITE_OTHER): Payer: Self-pay | Admitting: Adult Health

## 2023-11-25 ENCOUNTER — Ambulatory Visit (INDEPENDENT_AMBULATORY_CARE_PROVIDER_SITE_OTHER): Payer: 59 | Admitting: Adult Health

## 2023-11-25 VITALS — BP 118/84 | HR 103 | Temp 98.0°F | Ht 68.0 in | Wt 378.0 lb

## 2023-11-25 DIAGNOSIS — E559 Vitamin D deficiency, unspecified: Secondary | ICD-10-CM | POA: Diagnosis not present

## 2023-11-25 DIAGNOSIS — Z3A11 11 weeks gestation of pregnancy: Secondary | ICD-10-CM

## 2023-11-25 DIAGNOSIS — E66813 Obesity, class 3: Secondary | ICD-10-CM

## 2023-11-25 DIAGNOSIS — R030 Elevated blood-pressure reading, without diagnosis of hypertension: Secondary | ICD-10-CM

## 2023-11-25 DIAGNOSIS — E88819 Insulin resistance, unspecified: Secondary | ICD-10-CM

## 2023-11-25 DIAGNOSIS — E669 Obesity, unspecified: Secondary | ICD-10-CM

## 2023-11-25 DIAGNOSIS — Z6841 Body Mass Index (BMI) 40.0 and over, adult: Secondary | ICD-10-CM

## 2023-11-25 MED ORDER — METFORMIN HCL 500 MG PO TABS: ORAL_TABLET | ORAL | 0 refills | Status: DC

## 2023-11-25 NOTE — Progress Notes (Signed)
WEIGHT SUMMARY AND BIOMETRICS  Vitals Temp: 98 F (36.7 C) BP: 118/84 Pulse Rate: (!) 103 SpO2: 96 %   Anthropometric Measurements Height: 5\' 8"  (1.727 m) Weight: (!) 378 lb (171.5 kg) BMI (Calculated): 57.49 Weight at Last Visit: 379lb Weight Lost Since Last Visit: 1lb Weight Gained Since Last Visit: 0lb Starting Weight: 295lb Total Weight Loss (lbs): 0 lb (0 kg)   Body Composition  Body Fat %: 57.7 % Fat Mass (lbs): 218.4 lbs Muscle Mass (lbs): 152 lbs Total Body Water (lbs): 119.4 lbs Visceral Fat Rating : 22   Other Clinical Data Fasting: Yes Labs: No Today's Visit #: 48 Starting Date: 08/24/19    Chief Complaint:   OBESITY Ashley Acevedo is here to discuss her progress with her obesity treatment plan.  She is on the keeping a food journal and adhering to recommended goals of 1800 calories and 100+ protein and states she is following her eating plan approximately ? % of the time.  She states she is exercising Walking Daily.   Interim History:  Ashley Acevedo is currently [redacted] weeks pregnant. She has confirmed with her established OB that she can continue daily Lexapro 10mg  (Preg Class C) and daily Metformin 500mg  (Preg Class B). She has remained off Ergocalciferol. She has started daily Prenatal MVI  She has been walking daily and engaging in NEAT Activities  Subjective:   1. Elevated blood pressure reading BP improved and at goal at OV She is not on antihypertensive therapy  2. Insulin resistance She has confirmed with her established OB that she can continue daily Metformin 500mg  (Preg Class B).  3. Vitamin D deficiency She stopped and remained off Ergocalciferol since on/about 10/27/2023  4. [redacted] weeks gestation of pregnancy She has confirmed with her established OB that she can continue daily Lexapro 10mg  (Preg Class C) and daily Metformin 500mg  (Preg Class B). She has remained off Ergocalciferol. She has started daily Prenatal MVI  Assessment/Plan:    1. Elevated blood pressure reading Check Labs - Comprehensive metabolic panel Limit Na+ intake  2. Insulin resistance (Primary) Check Labs - Hemoglobin A1c - Insulin, random Refill  metFORMIN (GLUCOPHAGE) 500 MG tablet 1 tab daily with meal Dispense: 90 tablet, Refills: 0 ordered   3. Vitamin D deficiency Check Labs - VITAMIN D 25 Hydroxy (Vit-D Deficiency, Fractures) Remain off Ergocalciferol during pregnancy  4. [redacted] weeks gestation of pregnancy Remain well hydrated Remain active as tolerated F/u with OB Care Team  5. Obesity, current BMI 57.49  Ashley Acevedo is not currently in the action stage of change. As such, her goal is to maintain weight for now. She has agreed to keeping a food journal and adhering to recommended goals of 1800 calories and 100 protein.   Exercise goals: All adults should avoid inactivity. Some physical activity is better than none, and adults who participate in any amount of physical activity gain some health benefits. Adults should also include muscle-strengthening activities that involve all major muscle groups on 2 or more days a week.  Behavioral modification strategies: increasing lean protein intake, decreasing simple carbohydrates, increasing vegetables, increasing water intake, no skipping meals, meal planning and cooking strategies, keeping healthy foods in the home, ways to avoid boredom eating, and planning for success.  Ashley Acevedo has agreed to follow-up with our clinic in 4 weeks. She was informed of the importance of frequent follow-up visits to maximize her success with intensive lifestyle modifications for her multiple health conditions.   Ashley Acevedo was informed we  would discuss her lab results at her next visit unless there is a critical issue that needs to be addressed sooner. Ashley Acevedo agreed to keep her next visit at the agreed upon time to discuss these results.  Objective:   Blood pressure 118/84, pulse (!) 103, temperature 98 F (36.7 C), height  5\' 8"  (1.727 m), weight (!) 378 lb (171.5 kg), last menstrual period 09/07/2023, SpO2 96%. Body mass index is 57.47 kg/m.  General: Cooperative, alert, well developed, in no acute distress. HEENT: Conjunctivae and lids unremarkable. Cardiovascular: Regular rhythm.  Lungs: Normal work of breathing. Neurologic: No focal deficits.   Lab Results  Component Value Date   CREATININE 0.82 06/01/2023   BUN 10 06/01/2023   NA 141 06/01/2023   K 4.4 06/01/2023   CL 104 06/01/2023   CO2 23 06/01/2023   Lab Results  Component Value Date   ALT 18 06/01/2023   AST 16 06/01/2023   ALKPHOS 114 06/01/2023   BILITOT 0.3 06/01/2023   Lab Results  Component Value Date   HGBA1C 6.0 (H) 06/01/2023   HGBA1C 5.7 (H) 11/05/2022   HGBA1C 5.5 03/04/2022   HGBA1C 5.6 08/15/2021   HGBA1C 5.2 12/20/2020   Lab Results  Component Value Date   INSULIN 29.6 (H) 06/01/2023   INSULIN 39.0 (H) 11/05/2022   INSULIN 17.7 03/04/2022   INSULIN 103.0 (H) 08/15/2021   INSULIN 19.7 12/20/2020   Lab Results  Component Value Date   TSH 2.720 10/01/2022   Lab Results  Component Value Date   CHOL 208 (H) 06/01/2023   HDL 49 06/01/2023   LDLCALC 136 (H) 06/01/2023   TRIG 131 06/01/2023   CHOLHDL 4.2 06/01/2023   Lab Results  Component Value Date   VD25OH 37.5 06/01/2023   VD25OH 17.7 (L) 11/05/2022   VD25OH 27.1 (L) 03/04/2022   Lab Results  Component Value Date   WBC 8.1 12/20/2020   HGB 12.8 12/20/2020   HCT 39.4 12/20/2020   MCV 85 12/20/2020   PLT 350 12/20/2020   Lab Results  Component Value Date   IRON 76 12/20/2020   TIBC 416 12/20/2020   FERRITIN 23 12/20/2020   Attestation Statements:   Reviewed by clinician on day of visit: allergies, medications, problem list, medical history, surgical history, family history, social history, and previous encounter notes.  I have reviewed the above documentation for accuracy and completeness, and I agree with the above. -  Saliha Salts d. Iram Lundberg,  NP-C

## 2023-11-26 LAB — COMPREHENSIVE METABOLIC PANEL
ALT: 15 [IU]/L (ref 0–32)
AST: 12 [IU]/L (ref 0–40)
Albumin: 3.8 g/dL — ABNORMAL LOW (ref 4.0–5.0)
Alkaline Phosphatase: 92 [IU]/L (ref 44–121)
BUN/Creatinine Ratio: 14 (ref 9–23)
BUN: 8 mg/dL (ref 6–20)
Bilirubin Total: 0.2 mg/dL (ref 0.0–1.2)
CO2: 20 mmol/L (ref 20–29)
Calcium: 9.1 mg/dL (ref 8.7–10.2)
Chloride: 103 mmol/L (ref 96–106)
Creatinine, Ser: 0.56 mg/dL — ABNORMAL LOW (ref 0.57–1.00)
Globulin, Total: 3.1 g/dL (ref 1.5–4.5)
Glucose: 91 mg/dL (ref 70–99)
Potassium: 4.5 mmol/L (ref 3.5–5.2)
Sodium: 140 mmol/L (ref 134–144)
Total Protein: 6.9 g/dL (ref 6.0–8.5)
eGFR: 127 mL/min/{1.73_m2} (ref >59)

## 2023-11-26 LAB — HEMOGLOBIN A1C
Est. average glucose Bld gHb Est-mCnc: 117 mg/dL
Hgb A1c MFr Bld: 5.7 % — ABNORMAL HIGH (ref 4.8–5.6)

## 2023-11-26 LAB — INSULIN, RANDOM: INSULIN: 31.7 u[IU]/mL — ABNORMAL HIGH (ref 2.6–24.9)

## 2023-11-26 LAB — VITAMIN D 25 HYDROXY (VIT D DEFICIENCY, FRACTURES): Vit D, 25-Hydroxy: 26.5 ng/mL — ABNORMAL LOW (ref 30.0–100.0)

## 2023-11-27 ENCOUNTER — Encounter (INDEPENDENT_AMBULATORY_CARE_PROVIDER_SITE_OTHER): Payer: Self-pay | Admitting: Adult Health

## 2023-11-30 ENCOUNTER — Encounter: Payer: Self-pay | Admitting: *Deleted

## 2023-11-30 LAB — HEMOGLOBIN A1C: Hemoglobin A1C: 5.7

## 2023-12-01 ENCOUNTER — Telehealth (INDEPENDENT_AMBULATORY_CARE_PROVIDER_SITE_OTHER): Payer: BC Managed Care – PPO | Admitting: Neurology

## 2023-12-01 DIAGNOSIS — Z6841 Body Mass Index (BMI) 40.0 and over, adult: Secondary | ICD-10-CM

## 2023-12-01 DIAGNOSIS — G935 Compression of brain: Secondary | ICD-10-CM

## 2023-12-01 NOTE — Progress Notes (Unsigned)
[facility]    Provider:  Dr Lucia Gaskins Requesting Provider: Jarold Motto, PA Primary Care Provider:  Jarold Motto, [place]  Virtual Visit via Video Note  I connected with Ashley Acevedo on 12/02/23 at  3:00 PM EST by a video enabled telemedicine application and verified that I am speaking with the correct person using two identifiers.  Location: Patient: home Provider: Office   I discussed the limitations of evaluation and management by telemedicine and the availability of in person appointments. The patient expressed understanding and agreed to proceed.  Follow Up Instructions:    I discussed the assessment and treatment plan with the patient. The patient was provided an opportunity to ask questions and all were answered. The patient agreed with the plan and demonstrated an understanding of the instructions.   The patient was advised to call back or seek an in-person evaluation if the symptoms worsen or if the condition fails to improve as anticipated.  I provided 20 minutes of non-face-to-face time during this encounter.   Anson Fret, MD   CC:  Fhx of chiari malformation, headaches  12/02/2023: She is 12 weeks poregnant. She is stable, no new symptoms with chiari. Last image showed ice rounded tonsils, room around the brainstem at magnum foramen, asymptomatic. If she laughs very hard or coughs she feels dizzy and in the back of the head. When she bears down she may havesome head pain she will feel dizzy. But  this is baseline no changes since we last saw her this Is the same. Repeat MRI brain make sure it is stable. She is goin gto go to a MFM.  Patient complains of symptoms per HPI as well as the following symptoms: none . Pertinent negatives and positives per HPI. All others negative   11/01/2022:  7-42mm chiari. She is asymptomatic. Recommended weight loss. Monitor and come back if you have symptoms symptoms. Nice rounded tonsils, room around the brainstem  at magnum foramen, asymptomatic, just monitor it, may let obgyn know if pregnant. Watch for swelling behind the eyes can cause vision loss. 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  There is normal signal within the cerebellar tonsils and adjacent brainstem and spinal cord. The brain was otherwise normal. Mild chronic maxillary, ethmoid and sphenoid sinusitis.   No acute findings.  Normal enhancement pattern.    We discussed, if asymptomatic can monitor clinically. Interesting that she has so many family members with a chiari malformation.   Patient complains of symptoms per HPI as well as the following symptoms: none . Pertinent negatives and positives per HPI. All others negative  HPI:  Ashley Acevedo is a 28 y.o. female here as requested by Jarold Motto, PA for Fhx of chiari malformation.  I reviewed Dr. Ellyn Hack Stuckert's notes which show she has a history of Chiari malformation and her mother, she has anxiety, asthma since a child otherwise seems healthy, review of systems showed that she is morbidly obese body mass 50-59 in adult otherwise examination was unremarkable and Pap smear was negative for intraepithelial lesion or malignancy.  Dr. Ellyn Hack discussed because she has a family history in mother and grandmother of Chiari malformation that before patient has kids that she should be tested, this is something is  starting to think about.  Both mother and grandmother had chiari malformation. They did not have surgery.She has occasional headache in the back of the head moderate pain can last briefly while exerting herself with coughing then goes away, it can get worse with coughing, straining, may have occ neck pain, no dizziness or balance problems, occ  blurry vision, no muscle weakness, no numbness or tingling in the arms or legs, has headaches several times a months and triggered by coughing/valsalva. No blurred vision or double vision, no swallowing problems, no hearing loss or ringing in the ears, no problems with hands or difficulty walking or problems with bowel or bladder control. No other focal neurologic deficits, associated symptoms, inciting events or modifiable factors.   Reviewed notes, labs and imaging from outside physicians, which showed:  03/04/2022: Hgba1c 5.5     Latest Ref Rng & Units 11/25/2023    8:37 AM 06/01/2023    9:42 AM 11/05/2022    9:19 AM  CMP  Glucose 70 - 99 mg/dL 91  94  92   BUN 6 - 20 mg/dL 8  10  12    Creatinine 0.57 - 1.00 mg/dL 5.28  4.13  2.44   Sodium 134 - 144 mmol/L 140  141  141   Potassium 3.5 - 5.2 mmol/L 4.5  4.4  4.6   Chloride 96 - 106 mmol/L 103  104  104   CO2 20 - 29 mmol/L 20  23  23    Calcium 8.7 - 10.2 mg/dL 9.1  9.0  9.1   Total Protein 6.0 - 8.5 g/dL 6.9  6.7  7.1   Total Bilirubin 0.0 - 1.2 mg/dL 0.2  0.3  0.3   Alkaline Phos 44 - 121 IU/L 92  114  112   AST 0 - 40 IU/L 12  16  18    ALT 0 - 32 IU/L 15  18  29        Latest Ref Rng & Units 12/20/2020   10:12 AM 04/11/2020    1:25 PM 01/02/2020   10:12 AM  CBC  WBC 3.4 - 10.8 x10E3/uL 8.1  7.8  6.4   Hemoglobin 11.1 - 15.9 g/dL 01.0  27.2  53.6   Hematocrit 34.0 - 46.6 % 39.4  38.2  39.7   Platelets 150 - 450 x10E3/uL 350  300  327     Review of Systems: Patient complains of symptoms per HPI as well as the following symptoms headache. Pertinent negatives and positives per HPI. All others negative.   Social History   Socioeconomic History   Marital status: Married    Spouse name: Not on file   Number of children: Not on file   Years of education: Not on file   Highest education level: Not on file  Occupational History   Not on file  Tobacco Use   Smoking status: Never   Smokeless tobacco: Never  Vaping Use    Vaping status: Never Used  Substance and Sexual Activity   Alcohol use: Yes    Comment: socially   Drug use: No   Sexual activity: Yes    Birth control/protection: Pill  Other Topics Concern   Not on file  Social History Narrative   Biology degree --> wants to do research   Works at a [employer]    No children or pets   Social Drivers of Corporate investment banker Strain: Not on file  Food Insecurity: Not on  file  Transportation Needs: Not on file  Physical Activity: Not on file  Stress: Not on file  Social Connections: Unknown (02/21/2022)   [facility] from [facility], [facility]   Social Network    Social Network: Not on file  Intimate Partner Violence: Unknown (01/13/2022)   Received from [facility], [facility]   HITS    Physically Hurt: Not on file    Insult or Talk Down To: Not on file    Threaten Physical Harm: Not on file    Scream or Curse: Not on file    Family History  Problem Relation Age of Onset   Asthma Mother    Obesity Mother    Chiari malformation Mother    Hyperlipidemia Father    Atrial fibrillation Father        had ablation and is now in rhythm   Asthma Maternal Grandmother    Stomach cancer Maternal Grandmother    Chiari malformation Maternal Grandmother    Lung cancer Maternal Grandfather    Prostate cancer Maternal Grandfather    Colon cancer Maternal Grandfather        late 60's/early 80's   Alzheimer's disease Maternal Grandfather    Brain cancer Paternal Grandmother    Lung cancer Paternal Grandfather    Allergic rhinitis Neg Hx    Angioedema Neg Hx    Eczema Neg Hx    Immunodeficiency Neg Hx    Urticaria Neg Hx     Past Medical History:  Diagnosis Date   Allergies    Anxiety    Asthma    Back pain    GERD (gastroesophageal reflux disease)    Heartburn    History of stomach ulcers    Hyperlipidemia    Joint pain     Patient Active Problem List   Diagnosis Date Noted   Chiari I  malformation (HCC) 12/02/2022   BMI 50.0-59.9, adult (HCC) 11/24/2022   At risk for osteoporosis 04/01/2022   Vitamin B12 deficiency 03/11/2022   Nausea in adult 11/27/2021   Moderate persistent asthma without complication 06/21/2021   Pollen-food allergy 06/21/2021   Gastroesophageal reflux disease 06/21/2021   Fatigue 12/20/2020   Mixed hyperlipidemia 06/12/2020   B12 nutritional deficiency 01/23/2020   Insulin resistance 01/23/2020   Vitamin D deficiency 01/23/2020   Depression 01/23/2020   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/2020   Class 3 severe obesity with serious comorbidity and body mass index (BMI) of 50.0 to 59.9 in adult (HCC) 01/23/2020   Mild persistent asthma without complication 11/02/2017   Seasonal and perennial allergic rhinitis 11/02/2017   Seasonal allergic conjunctivitis 11/02/2017   Anxiety 04/10/2016    Past Surgical History:  Procedure Laterality Date   WISDOM TOOTH EXTRACTION      Current Outpatient Medications  Medication Sig Dispense Refill   albuterol (VENTOLIN HFA) 108 (90 Base) MCG/ACT inhaler Inhale 2 puffs into the lungs every 4 (four) hours as needed for wheezing or shortness of breath. 18 g 1   cetirizine (ZYRTEC) 10 MG tablet Take 10 mg by mouth daily.     escitalopram (LEXAPRO) 10 MG tablet Take 1 tablet (10 mg total) by mouth daily. 90 tablet 0   fluticasone (FLONASE) 50 MCG/ACT nasal spray Place 1 spray into both nostrils daily.     metFORMIN (GLUCOPHAGE) 500 MG tablet 1 tab daily with meal 90 tablet 0   pantoprazole (PROTONIX) 40 MG tablet TAKE 1 TABLET(40 MG) BY MOUTH DAILY 90 tablet 0   No current facility-administered medications for  this visit.    Allergies as of 12/01/2023 - Review Complete 11/25/2023  Allergen Reaction Noted   Clindamycin Rash 11/17/2018   Codeine Rash 04/10/2016   Hydrocodone Rash 11/02/2017    Vitals: LMP 09/07/2023 (Exact Date)  Last Weight:  Wt Readings from Last 1 Encounters:  11/25/23 (!) 378 lb (171.5 kg)    Last Height:   Ht Readings from Last 1 Encounters:  11/25/23 5\' 8"  (1.727 m)      Physical exam: Exam: Gen: NAD, conversant      CV: No palpitations or chest pain or SOB. VS: Breathing at a normal rate. Weight appears obese. Not febrile. Eyes: Conjunctivae clear without exudates or hemorrhage  Neuro: Detailed Neurologic Exam  Speech:    Speech is normal; fluent and spontaneous with normal comprehension.  Cognition:    The patient is oriented to person, place, and time;     recent and remote memory intact;     language fluent;     normal attention, concentration, fund of knowledge Cranial Nerves:    The pupils are equal, round, and reactive to light. Visual fields are full Extraocular movements are intact.  The face is symmetric with normal sensation. The palate elevates in the midline. Hearing intact. Voice is normal. Shoulder shrug is normal. The tongue has normal motion without fasciculations.   Coordination: normal  Gait:    No abnormalities noted or reported  Motor Observation:   no involuntary movements noted. Tone:    Appears normal  Posture:    Posture is normal. normal erect    Strength:    Strength is anti-gravity and symmetric in the upper and lower limbs.      Sensation: intact to LT, no reports of numbness or tingling or paresthesias          Assessment/Plan: This is a 28 year old female with a family history of Chiari malformation in her mother and her grandmother.  She is morbidly obese.    She is [redacted] weeks pregnant. She is stable, no new symptoms with chiari. Last image showed nice rounded tonsils, room around the brainstem at magnum foramen, asymptomatic. There is literature that women with asymptomatic chiari (no hydrocephalus or papilledema) can attempt vaginal birth. Speak to her maternal fetal specialist. If they woul like repeat imaging we can provide. Also asked patient to f/u ophthalmology to ensure no papillemedema.   11/01/2022:  7-57mm  chiari. She is asymptomatic. Recommended weight loss. Monitor and come back if you have symptoms symptoms. Nice rounded tonsils, room around the brainstem at magnum foramen, asymptomatic, just monitor it, may let obgyn know if pregnant. Watch for swelling behind the eyes can cause vision loss. 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  There is normal signal within the cerebellar tonsils and adjacent brainstem and spinal cord. The brain was otherwise normal. Mild chronic maxillary, ethmoid and sphenoid sinusitis.   No acute findings.  Normal enhancement pattern.    Patient complains of symptoms per HPI as well as the following symptoms: none . Pertinent negatives and positives per HPI. All others negative   Patient complains of symptoms per HPI as well as the following symptoms: none . Pertinent negatives and positives per HPI. All others negative  Discussed: To prevent or relieve headaches, try the following: Cool Compress. Lie down  and place a cool compress on your head.  Avoid headache triggers. If certain foods or odors seem to have triggered your migraines in the past, avoid them. A headache diary might help you identify triggers.  Include physical activity in your daily routine. Try a daily walk or other moderate aerobic exercise.  Manage stress. Find healthy ways to cope with the stressors, such as delegating tasks on your to-do list.  Practice relaxation techniques. Try deep breathing, yoga, massage and visualization.  Eat regularly. Eating regularly scheduled meals and maintaining a healthy diet might help prevent headaches. Also, drink plenty of fluids.  Follow a regular sleep schedule. Sleep deprivation might contribute to headaches Consider biofeedback. With this mind-body technique, you learn  to control certain bodily functions -- such as muscle tension, heart rate and blood pressure -- to prevent headaches or reduce headache pain.    Proceed to emergency room if you experience new or worsening symptoms or symptoms do not resolve, if you have new neurologic symptoms or if headache is severe, or for any concerning symptom.   Provided education and documentation on chiari in the AVS   Cc: Jarold Motto, [place],  [place], [place], [place]  Naomie Dean, MD  [facility] [address] [place], [place] [address]  Phone [phone] Fax [phone]

## 2023-12-02 ENCOUNTER — Encounter: Payer: Self-pay | Admitting: Neurology

## 2023-12-08 DIAGNOSIS — E88819 Insulin resistance, unspecified: Secondary | ICD-10-CM | POA: Diagnosis not present

## 2023-12-08 DIAGNOSIS — Z3481 Encounter for supervision of other normal pregnancy, first trimester: Secondary | ICD-10-CM | POA: Diagnosis not present

## 2023-12-08 DIAGNOSIS — Z369 Encounter for antenatal screening, unspecified: Secondary | ICD-10-CM | POA: Diagnosis not present

## 2023-12-08 DIAGNOSIS — Z3480 Encounter for supervision of other normal pregnancy, unspecified trimester: Secondary | ICD-10-CM | POA: Diagnosis not present

## 2023-12-08 LAB — OB RESULTS CONSOLE ANTIBODY SCREEN: Antibody Screen: NEGATIVE

## 2023-12-08 LAB — CBC AND DIFFERENTIAL
HCT: 33 — AB (ref 36–46)
Hemoglobin: 10.3 — AB (ref 12.0–16.0)
Platelets: 326 10*3/uL (ref 150–400)

## 2023-12-08 LAB — OB RESULTS CONSOLE GC/CHLAMYDIA: Chlamydia: NEGATIVE

## 2023-12-08 LAB — OB RESULTS CONSOLE RUBELLA ANTIBODY, IGM: Rubella: IMMUNE

## 2023-12-08 LAB — HM HEPATITIS C SCREENING LAB: HM Hepatitis Screen: NEGATIVE

## 2023-12-08 LAB — HEPATITIS B SURFACE ANTIGEN: Hepatitis B Surface Ag: NEGATIVE

## 2023-12-08 LAB — HIV ANTIBODY (ROUTINE TESTING W REFLEX): HIV 1&2 Ab, 4th Generation: NONREACTIVE

## 2023-12-08 LAB — HM HIV SCREENING LAB: HM HIV Screening: NEGATIVE

## 2023-12-08 LAB — HEPATITIS C ANTIBODY: HCV Ab: NEGATIVE

## 2023-12-09 ENCOUNTER — Ambulatory Visit (INDEPENDENT_AMBULATORY_CARE_PROVIDER_SITE_OTHER): Payer: Self-pay | Admitting: Adult Health

## 2023-12-23 ENCOUNTER — Encounter (INDEPENDENT_AMBULATORY_CARE_PROVIDER_SITE_OTHER): Payer: Self-pay | Admitting: Adult Health

## 2023-12-23 ENCOUNTER — Ambulatory Visit (INDEPENDENT_AMBULATORY_CARE_PROVIDER_SITE_OTHER): Payer: 59 | Admitting: Adult Health

## 2023-12-23 VITALS — BP 124/81 | HR 105 | Temp 97.6°F | Ht 68.0 in | Wt 380.0 lb

## 2023-12-23 DIAGNOSIS — F3289 Other specified depressive episodes: Secondary | ICD-10-CM | POA: Diagnosis not present

## 2023-12-23 DIAGNOSIS — F5089 Other specified eating disorder: Secondary | ICD-10-CM | POA: Diagnosis not present

## 2023-12-23 DIAGNOSIS — E66813 Obesity, class 3: Secondary | ICD-10-CM

## 2023-12-23 DIAGNOSIS — E88819 Insulin resistance, unspecified: Secondary | ICD-10-CM

## 2023-12-23 DIAGNOSIS — E669 Obesity, unspecified: Secondary | ICD-10-CM

## 2023-12-23 DIAGNOSIS — R030 Elevated blood-pressure reading, without diagnosis of hypertension: Secondary | ICD-10-CM

## 2023-12-23 DIAGNOSIS — Z3A15 15 weeks gestation of pregnancy: Secondary | ICD-10-CM

## 2023-12-23 DIAGNOSIS — Z6841 Body Mass Index (BMI) 40.0 and over, adult: Secondary | ICD-10-CM

## 2023-12-23 MED ORDER — ESCITALOPRAM OXALATE 10 MG PO TABS: 10.0000 mg | ORAL_TABLET | Freq: Every day | ORAL | 0 refills | Status: DC

## 2023-12-23 NOTE — Progress Notes (Signed)
 WEIGHT SUMMARY AND BIOMETRICS  Vitals Temp: 97.6 F (36.4 C) BP: 124/81 Pulse Rate: (!) 105 SpO2: 96 %   Anthropometric Measurements Height: 5\' 8"  (1.727 m) Weight: (!) 380 lb (172.4 kg) BMI (Calculated): 57.79 Weight at Last Visit: 378lb Weight Lost Since Last Visit: 0 Weight Gained Since Last Visit: 2lb Starting Weight: 295lb Total Weight Loss (lbs): 0 lb (0 kg)   Body Composition  Body Fat %: 59.4 % Fat Mass (lbs): 226.2 lbs Muscle Mass (lbs): 146.8 lbs Visceral Fat Rating : 22   Other Clinical Data Fasting: no Labs: no Today's Visit #: 48 Starting Date: 08/24/19    Chief Complaint:   OBESITY Ashley Acevedo is here to discuss her progress with her obesity treatment plan.  She is on the keeping a food journal and adhering to recommended goals of 1800 calories and 100g+ protein and states she is following her eating plan approximately 50 % of the time.  She states she is exercising Walking 30 minutes 2-3 times per week.   Interim History:  Ashley Acevedo is currently [redacted] weeks pregnant. She had recent fetal testing, results were inconclusive as not enough fetal tissue was obtained. She has been experiencing fatigue and nausea without vomiting.  She continues to walk several times per week  She will have a gender reveal this weekend!  She endorses stable appetite and has not experienced unusual cravings during her pregnancy.  She was recently seen by her established Neurologist- she was advised to speak to her Maternal Fetal Specialist in regards to safest delivery options in regards to her Chiari Malformation  Subjective:   1. Insulin resistance  Latest Reference Range & Units 11/05/22 09:19 06/01/23 09:42 11/25/23 08:37  INSULIN 2.6 - 24.9 uIU/mL 39.0 (H) 29.6 (H) 31.7 (H)  (H): Data is abnormally high Lab Results  Component Value Date   HGBA1C 5.7 (H) 11/25/2023   HGBA1C 6.0 (H) 06/01/2023   HGBA1C 5.7 (H) 11/05/2022    She denies polyphagia She is on  daily Metformin 500mg  She denies loose stools  2. [redacted] weeks gestation of pregnancy Ashley Acevedo is currently [redacted] weeks pregnant. She had recent fetal testing, results were inconclusive as not enough fetal tissue was obtained. She has been experiencing fatigue and nausea without vomiting. She was recently seen by her established Neurologist- she was advised to speak to her Maternal Fetal Specialist in regards to safest delivery options in regards to her Chiari Malformation Neurology notes reviewed with pt today.  3. Other depression, with emotional eating Ashley Acevedo has confirmed with OB/GYN that continuing Lexapro 10mg  during 1st and 2nd trimester is safe. She endorses stable mood, denies SI/HI  Assessment/Plan:   1. Insulin resistance (Primary) Continue to consume lean protein and limit sugar/simple CHO Continue regular walking  2. [redacted] weeks gestation of pregnancy Continue to consume lean protein and limit sugar/simple CHO Continue regular walking F/u with OB, Maternal Fetal Specialists as directed  3. Other depression, with emotional eating Refill - escitalopram (LEXAPRO) 10 MG tablet; Take 1 tablet (10 mg total) by mouth daily.  Dispense: 90 tablet; Refill: 0  5. Obesity, current BMI 57.49  Ashley Acevedo is currently in the action stage of change. As such, her goal is to continue with weight loss efforts. She has agreed to keeping a food journal and adhering to recommended goals of 1800 calories and 100g+ protein.   Exercise goals: All adults should avoid inactivity. Some physical activity is better than none, and adults who participate  in any amount of physical activity gain some health benefits. Adults should also include muscle-strengthening activities that involve all major muscle groups on 2 or more days a week.  Behavioral modification strategies: increasing lean protein intake, decreasing simple carbohydrates, increasing vegetables, increasing water intake, no skipping meals, meal planning  and cooking strategies, keeping healthy foods in the home, and planning for success.  Ashley Acevedo has agreed to follow-up with our clinic in 4 weeks. She was informed of the importance of frequent follow-up visits to maximize her success with intensive lifestyle modifications for her multiple health conditions.   Objective:   Blood pressure 124/81, pulse (!) 105, temperature 97.6 F (36.4 C), height 5\' 8"  (1.727 m), weight (!) 380 lb (172.4 kg), last menstrual period 09/07/2023, SpO2 96%. Body mass index is 57.78 kg/m.  General: Cooperative, alert, well developed, in no acute distress. HEENT: Conjunctivae and lids unremarkable. Cardiovascular: Regular rhythm.  Lungs: Normal work of breathing. Neurologic: No focal deficits.   Lab Results  Component Value Date   CREATININE 0.56 (L) 11/25/2023   BUN 8 11/25/2023   NA 140 11/25/2023   K 4.5 11/25/2023   CL 103 11/25/2023   CO2 20 11/25/2023   Lab Results  Component Value Date   ALT 15 11/25/2023   AST 12 11/25/2023   ALKPHOS 92 11/25/2023   BILITOT 0.2 11/25/2023   Lab Results  Component Value Date   HGBA1C 5.7 (H) 11/25/2023   HGBA1C 6.0 (H) 06/01/2023   HGBA1C 5.7 (H) 11/05/2022   HGBA1C 5.5 03/04/2022   HGBA1C 5.6 08/15/2021   Lab Results  Component Value Date   INSULIN 31.7 (H) 11/25/2023   INSULIN 29.6 (H) 06/01/2023   INSULIN 39.0 (H) 11/05/2022   INSULIN 17.7 03/04/2022   INSULIN 103.0 (H) 08/15/2021   Lab Results  Component Value Date   TSH 2.720 10/01/2022   Lab Results  Component Value Date   CHOL 208 (H) 06/01/2023   HDL 49 06/01/2023   LDLCALC 136 (H) 06/01/2023   TRIG 131 06/01/2023   CHOLHDL 4.2 06/01/2023   Lab Results  Component Value Date   VD25OH 26.5 (L) 11/25/2023   VD25OH 37.5 06/01/2023   VD25OH 17.7 (L) 11/05/2022   Lab Results  Component Value Date   WBC 8.1 12/20/2020   HGB 12.8 12/20/2020   HCT 39.4 12/20/2020   MCV 85 12/20/2020   PLT 350 12/20/2020   Lab Results  Component  Value Date   IRON 76 12/20/2020   TIBC 416 12/20/2020   FERRITIN 23 12/20/2020   Attestation Statements:   Reviewed by clinician on day of visit: allergies, medications, problem list, medical history, surgical history, family history, social history, and previous encounter notes.  I have reviewed the above documentation for accuracy and completeness, and I agree with the above. -  Brynnan Rodenbaugh d. Ashley Oldfield, NP-C

## 2023-12-24 ENCOUNTER — Other Ambulatory Visit: Payer: Self-pay

## 2023-12-30 ENCOUNTER — Ambulatory Visit

## 2023-12-30 ENCOUNTER — Ambulatory Visit: Attending: Obstetrics and Gynecology

## 2023-12-30 DIAGNOSIS — O285 Abnormal chromosomal and genetic finding on antenatal screening of mother: Secondary | ICD-10-CM

## 2023-12-30 DIAGNOSIS — Z3A16 16 weeks gestation of pregnancy: Secondary | ICD-10-CM

## 2023-12-30 DIAGNOSIS — Z3482 Encounter for supervision of other normal pregnancy, second trimester: Secondary | ICD-10-CM | POA: Diagnosis not present

## 2023-12-30 NOTE — Progress Notes (Deleted)
 [facility] at [facility] [address], [address] Phone:  [phone]   Fax:  [phone]      In-Person Genetic Counseling Clinic Note:   I spoke with 28 y.o. Ashley Acevedo today to discuss her NIPS results. She was referred by Jarold Motto, PA. She was accompanied by her husband Ashley Acevedo.   Pregnancy History:    G1P0. EGA: [redacted]w[redacted]d by LMP. EDD: 06/13/2024. Personal history of Chiari malformation type I. Denies bleeding, infections, and fevers in this pregnancy. Denies using tobacco, alcohol, or street drugs in this pregnancy.   Family History:    A three-generation pedigree was created and scanned into Epic under the Media tab.   [name] reports she was diagnosed with Chiari malformation type I after screening due to her mother's diagnosis of Chiari malformation type II. [name] was diagnosed in 10/2022 and is being followed by a neurologist. [name] reports she is asymptomatic. Her mother developed symptoms as a teenager, migraines and dizziness. She was advised to have a C-section when delivering [name] and [name]'s sister. [name] reports that her maternal grandmother had similar symptoms as her mother but no diagnosis was made. No other family members are affected. We discussed that Chiari malformation has different types, with type I being the least severe. Chiari malformation when isolated typically follows multifactorial inheritance; however, certain genetic syndromes can be associated with this finding. There may be a familial inheritance in [name]'s family. Her sister was encouraged by the neurologist to also be screened; however, she has not done so yet. Familial cases may be associated with an up to 50% recurrence risk. [name]'s neurologist can also request a referral to an adult genetics clinic if indicated.   FOB Ashley Acevedo reports his mother had a female stillborn due to an unknown heart defect. No other information is known. This may have been associated  with a genetic condition or may have been isolated. We reviewed that fetal anatomy ultrasound will screen for structural heart defects.   If the couple learns of additional information in their family history, we are happy to revisit recurrence risk.   Maternal ethnicity reported as White and paternal ethnicity reported as White. Denies Ashkenazi Jewish ancestry.   Family history not remarkable for consanguinity, individuals with birth defects, intellectual disability, autism spectrum disorder, multiple spontaneous abortions, or unexplained neonatal death.   Low Fetal Fraction on NIPS:   Noninvasive prenatal screening (NIPS) analyzes cell-free DNA originating from the placenta that is found in the maternal blood circulation during pregnancy to provide information regarding the presence or absence of extra DNA for chromosomes 13, 18, and 21 as well as the sex chromosomes. Ashley Acevedo's NIPS results from [facility] lab returned as no-calls due to low fetal fraction. Her first draw had a low fetal fraction of 2.1% at [redacted]w[redacted]d, and the second draw had a low fetal fraction of 2.1% at [redacted]w[redacted]d gestation. Due to this low fetal fraction, the lab was not able to estimate risks for common chromosomal differences.   Low fetal fraction can be associated with early gestational age, high maternal weight, suboptimal sample collection, pregnancies conceived using in vitro fertilization, use of certain medications such as blood thinners, certain maternal health conditions such as some autoimmune disorders, and fetal chromosomal abnormalities. Moreover, this result may be due to normal variation. We discussed that the low fetal fraction may be due to  increased maternal weight and the pregnancy may be unaffected with these chromosome differences listed above.  However, since low fetal fractions may be due to a chromosomal difference in the fetus, we reviewed and offered alternative screening and testing options to [name].   We reviewed  the technical aspects, benefits, risks, and limitations of each option: repeat NIPS, amniocentesis, maternal serum quad screen, and routine ultrasounds.   We discussed that [facility] Prequel offers fetal fraction amplification and is an option in cases of previous low fetal fraction results through [facility] or for patients with higher BMIs. This will not screen for triploidy; however, almost all cases of triploidy can be detected through ultrasounds. [name] has an anatomy ultrasound scheduled with [place] on 01/25/2024.   After hearing the above options, the couple elected to have [facility] Prequ[facility] NIPS.   Carrier Screening:   We discussed and offered carrier screening per the ACOG Committee Opinion 691 as well as expanded carrier screening for autosomal recessive and X-linked conditions. The technical aspects, benefits, risks, and limitations of carrier screening were discussed. We reviewed that if the patient is found to be a carrier for a genetic condition, carrier screening is available for the partner. If both are found to be carriers for the same condition, there is a 25% chance of the pregnancy to be affected.   [name] declined carrier screening at this time.   Newborn Screening. The [place] Newborn Screening (NBS) program will screen all newborn babies for cystic fibrosis, spinal muscular atrophy, hemoglobinopathies, and numerous other conditions. The couple was also provided a sheet for Ashley Acevedo's Early Check which they can sign up for within a month after delivery.   Plan of Care:   [facility] NIPS has been ordered. [name] declined carrier screening. MFM ultrasound scheduled for 01/25/2024. Added to [facility] list.   Informed consent was obtained. All questions were answered.   100 minutes were spent on the date of the encounter in service to the patient including preparation, face-to-face consultation, discussion of test reports and available next steps, pedigree construction, genetic  risk assessment, documentation, and care coordination.    Thank you for sharing in the care of Meyah with [place].  Please do not hesitate to contact us at [phone] if you have any questions.   Sheppard Plumber, MS, [facility] Certified Genetic Counselor   Genetic counseling student involved in appointment: No.

## 2023-12-30 NOTE — Progress Notes (Signed)
 [facility] at [facility] [address], [address] Phone:  [phone]   Fax:  [phone]      In-Person Genetic Counseling Clinic Note:   I spoke with 28 y.o. Ashley Acevedo today to discuss her NIPS results. She was referred by Jarold Motto, PA. She was accompanied by her husband Ashley Acevedo.   Pregnancy History:    G1P0. EGA: [redacted]w[redacted]d by LMP. EDD: 06/13/2024. Personal history of Chiari malformation type I. Denies bleeding, infections, and fevers in this pregnancy. Denies using tobacco, alcohol, or street drugs in this pregnancy.   Family History:    A three-generation pedigree was created and scanned into Epic under the Media tab.   Ashley Acevedo reports she was diagnosed with Chiari malformation type I after screening due to her mother's diagnosis of Chiari malformation type II. Ashley Acevedo was diagnosed in 10/2022 and is being followed by a neurologist. Ashley Acevedo reports she is asymptomatic. Her mother developed symptoms as a teenager, migraines and dizziness. She was advised to have a C-section when delivering Ashley Acevedo and Ashley Acevedo's sister. Ashley Acevedo reports that her maternal grandmother had similar symptoms as her mother but no diagnosis was made. No other family members are affected. We discussed that Chiari malformation has different types, with type I being the least severe. Chiari malformation when isolated typically follows multifactorial inheritance; however, certain genetic syndromes can be associated with this finding. There may be a familial inheritance in Ashley Acevedo's family. Her sister was encouraged by the neurologist to also be screened; however, she has not done so yet. Familial cases may be associated with an up to 50% recurrence risk. Ashley Acevedo's neurologist can also request a referral to an adult genetics clinic if indicated.   Ashley Acevedo reports his mother had a female stillborn due to an unknown heart defect. No other information is known. This may have been associated  with a genetic condition or may have been isolated. We reviewed that fetal anatomy ultrasound will screen for structural heart defects.   If the couple learns of additional information in their family history, we are happy to revisit recurrence risk.   Maternal ethnicity reported as White and paternal ethnicity reported as White. Denies Ashkenazi Jewish ancestry.   Family history not remarkable for consanguinity, individuals with birth defects, intellectual disability, autism spectrum disorder, multiple spontaneous abortions, or unexplained neonatal death.   Low Fetal Fraction on NIPS:   Noninvasive prenatal screening (NIPS) analyzes cell-free DNA originating from the placenta that is found in the maternal blood circulation during pregnancy to provide information regarding the presence or absence of extra DNA for chromosomes 13, 18, and 21 as well as the sex chromosomes. Ashley Acevedo's NIPS results from [facility] lab returned as no-calls due to low fetal fraction. Her first draw had a low fetal fraction of 2.1% at [redacted]w[redacted]d, and the second draw had a low fetal fraction of 2.1% at [redacted]w[redacted]d gestation. Due to this low fetal fraction, the lab was not able to estimate risks for common chromosomal differences.   Low fetal fraction can be associated with early gestational age, high maternal weight, suboptimal sample collection, pregnancies conceived using in vitro fertilization, use of certain medications such as blood thinners, certain maternal health conditions such as some autoimmune disorders, and fetal chromosomal abnormalities. Moreover, this result may be due to normal variation. We discussed that the low fetal fraction may be due to  increased maternal weight and the pregnancy may be unaffected with these chromosome differences listed above.  However, since low fetal fractions may be due to a chromosomal difference in the fetus, we reviewed and offered alternative screening and testing options to Ashley Acevedo.   We reviewed  the technical aspects, benefits, risks, and limitations of each option: repeat NIPS, amniocentesis, maternal serum quad screen, and routine ultrasounds.   We discussed that [facility] Prequel offers fetal fraction amplification and is an option in cases of previous low fetal fraction results through [facility] or for patients with higher BMIs. This will not screen for triploidy; however, almost all cases of triploidy can be detected through ultrasounds. Ashley Acevedo has an anatomy ultrasound scheduled with [place] on 01/25/2024.   After hearing the above options, the couple elected to have [facility] NIPS.   Carrier Screening:   We discussed and offered carrier screening per the ACOG Committee Opinion 691 as well as expanded carrier screening for autosomal recessive and X-linked conditions. The technical aspects, benefits, risks, and limitations of carrier screening were discussed. We reviewed that if the patient is found to be a carrier for a genetic condition, carrier screening is available for the partner. If both are found to be carriers for the same condition, there is a 25% chance of the pregnancy to be affected.   Ashley Acevedo declined carrier screening at this time.   Newborn Screening. The [place] Newborn Screening (NBS) program will screen all newborn babies for cystic fibrosis, spinal muscular atrophy, hemoglobinopathies, and numerous other conditions. The couple was also provided a sheet for Ashley Acevedo's Early Check which they can sign up for within a month after delivery.   Plan of Care:   [facility] P[facility] NIPS has been ordered. [name] declined carrier screening. MFM ultrasound scheduled for 01/25/2024. Added to [facility] list.   Informed consent was obtained. All questions were answered.   100 minutes were spent on the date of the encounter in service to the patient including preparation, face-to-face consultation, discussion of test reports and available next steps, pedigree construction, genetic  risk assessment, documentation, and care coordination.    Thank you for sharing in the care of Meyah with [place].  Please do not hesitate to contact us at [phone] if you have any questions.   Sheppard Plumber, MS, [facility] Certified Genetic Counselor   Genetic counseling student involved in appointment: No.

## 2024-01-01 ENCOUNTER — Encounter: Payer: Self-pay | Admitting: Physician Assistant

## 2024-01-01 LAB — TYPE AND SCREEN
Antibody Screen: NEGATIVE
Rh Type: POSITIVE

## 2024-01-04 ENCOUNTER — Telehealth: Payer: Self-pay

## 2024-01-04 ENCOUNTER — Other Ambulatory Visit: Payer: Self-pay

## 2024-01-04 DIAGNOSIS — Z029 Encounter for administrative examinations, unspecified: Secondary | ICD-10-CM | POA: Diagnosis not present

## 2024-01-04 NOTE — Telephone Encounter (Signed)
 Patient called back. We discussed her recent Myriad cell-free DNA (cfDNA) screening results. The result is low risk, consistent with a female fetus. This screening significantly reduces but does not eliminate the chance that the current pregnancy has Down syndrome (trisomy 42), trisomy 93, trisomy 70, and common sex chromosome conditions. Please see report for details. Additionally, there are many genetic conditions that cannot be detected by cfDNA.  Sheppard Plumber, MS, [facility] Certified Genetic Counselor [facility] [phone]

## 2024-01-04 NOTE — Telephone Encounter (Signed)
 I left a message asking the patient to call back to discuss test results.

## 2024-01-05 ENCOUNTER — Other Ambulatory Visit: Payer: Self-pay

## 2024-01-05 ENCOUNTER — Other Ambulatory Visit: Payer: Self-pay | Admitting: Obstetrics

## 2024-01-05 DIAGNOSIS — G935 Compression of brain: Secondary | ICD-10-CM

## 2024-01-11 DIAGNOSIS — H47333 Pseudopapilledema of optic disc, bilateral: Secondary | ICD-10-CM | POA: Diagnosis not present

## 2024-01-15 ENCOUNTER — Ambulatory Visit: Payer: Self-pay

## 2024-01-20 ENCOUNTER — Telehealth: Payer: Self-pay

## 2024-01-20 DIAGNOSIS — O99210 Obesity complicating pregnancy, unspecified trimester: Secondary | ICD-10-CM | POA: Insufficient documentation

## 2024-01-21 ENCOUNTER — Ambulatory Visit: Attending: Obstetrics and Gynecology | Admitting: *Deleted

## 2024-01-21 DIAGNOSIS — Z3A19 19 weeks gestation of pregnancy: Secondary | ICD-10-CM

## 2024-01-21 DIAGNOSIS — E88819 Insulin resistance, unspecified: Secondary | ICD-10-CM

## 2024-01-21 DIAGNOSIS — O99810 Abnormal glucose complicating pregnancy: Secondary | ICD-10-CM | POA: Diagnosis not present

## 2024-01-21 DIAGNOSIS — K219 Gastro-esophageal reflux disease without esophagitis: Secondary | ICD-10-CM

## 2024-01-21 DIAGNOSIS — J4540 Moderate persistent asthma, uncomplicated: Secondary | ICD-10-CM

## 2024-01-21 NOTE — Progress Notes (Signed)
 New OB Intake  I connected with Jaylene M Hassinger  on 01/21/24 at 0813 by phone and verified that I am speaking with the correct person using two identifiers. Nurse is located at [facility] and pt is located at home.   I explained I am completing New Patient Intake today. We discussed EDD of 06/13/2024, by Last Menstrual Period. Pt is G1P0000. I reviewed her allergies, medications and Medical/Surgical/OB history.    Problem List There are no diagnoses linked to this encounter.  OB History  Gravida Para Term Preterm AB Living  1 0 0 0 0 0  SAB IAB Ectopic Multiple Live Births  0 0 0 0 0    # Outcome Date GA Lbr Len/2nd Weight Sex Type Anes PTL Lv  1 Current              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/2020   Allergies    Anxiety    Asthma    B12 nutritional deficiency 01/23/2020   Back pain    Depression 01/23/2020   Fatigue 12/20/2020   GERD (gastroesophageal reflux disease)    Heartburn    History of stomach ulcers    Hyperlipidemia    Joint pain    Mild persistent asthma without complication 11/02/2017   Mixed hyperlipidemia 06/12/2020   Nausea in adult 11/27/2021   Seasonal allergic conjunctivitis 11/02/2017   Seasonal and perennial allergic rhinitis 11/02/2017   Vitamin B12 deficiency 03/11/2022   Vitamin D deficiency 01/23/2020       Past Surgical History:  Procedure Laterality Date   WISDOM TOOTH EXTRACTION       Current Outpatient Medications on File Prior to Visit  Medication Sig Dispense Refill   albuterol (VENTOLIN HFA) 108 (90 Base) MCG/ACT inhaler Inhale 2 puffs into the lungs every 4 (four) hours as needed for wheezing or shortness of breath. 18 g 1   cetirizine (ZYRTEC) 10 MG tablet Take 10 mg by mouth daily.     escitalopram (LEXAPRO) 10 MG tablet Take 1 tablet (10 mg total) by mouth daily. 90 tablet 0   fluticasone (FLONASE) 50 MCG/ACT nasal spray Place 1 spray into both nostrils daily.     metFORMIN  (GLUCOPHAGE) 500 MG tablet 1 tab daily with meal 90 tablet 0   pantoprazole (PROTONIX) 40 MG tablet TAKE 1 TABLET(40 MG) BY MOUTH DAILY 90 tablet 0   No current facility-administered medications on file prior to visit.      Allergies  Allergen Reactions   Clindamycin Rash   Codeine Rash   Hydrocodone Rash     Patient advised of first appointment in our office and when to arrive.   All questions were answered.

## 2024-01-25 ENCOUNTER — Ambulatory Visit

## 2024-01-25 ENCOUNTER — Ambulatory Visit: Attending: Obstetrics

## 2024-01-25 ENCOUNTER — Other Ambulatory Visit: Payer: Self-pay | Admitting: *Deleted

## 2024-01-25 ENCOUNTER — Ambulatory Visit: Admitting: Maternal & Fetal Medicine

## 2024-01-25 VITALS — BP 123/64 | HR 97

## 2024-01-25 DIAGNOSIS — G935 Compression of brain: Secondary | ICD-10-CM | POA: Diagnosis not present

## 2024-01-25 DIAGNOSIS — J45909 Unspecified asthma, uncomplicated: Secondary | ICD-10-CM

## 2024-01-25 DIAGNOSIS — O99512 Diseases of the respiratory system complicating pregnancy, second trimester: Secondary | ICD-10-CM

## 2024-01-25 DIAGNOSIS — E88819 Insulin resistance, unspecified: Secondary | ICD-10-CM

## 2024-01-25 DIAGNOSIS — E669 Obesity, unspecified: Secondary | ICD-10-CM

## 2024-01-25 DIAGNOSIS — O99210 Obesity complicating pregnancy, unspecified trimester: Secondary | ICD-10-CM

## 2024-01-25 DIAGNOSIS — E66813 Obesity, class 3: Secondary | ICD-10-CM | POA: Insufficient documentation

## 2024-01-25 DIAGNOSIS — Z362 Encounter for other antenatal screening follow-up: Secondary | ICD-10-CM

## 2024-01-25 DIAGNOSIS — Z3A20 20 weeks gestation of pregnancy: Secondary | ICD-10-CM

## 2024-01-25 DIAGNOSIS — O99212 Obesity complicating pregnancy, second trimester: Secondary | ICD-10-CM | POA: Diagnosis not present

## 2024-01-25 DIAGNOSIS — O99342 Other mental disorders complicating pregnancy, second trimester: Secondary | ICD-10-CM

## 2024-01-25 DIAGNOSIS — Z6841 Body Mass Index (BMI) 40.0 and over, adult: Secondary | ICD-10-CM | POA: Insufficient documentation

## 2024-01-25 NOTE — Progress Notes (Signed)
 Patient information  Patient Name: Ashley Acevedo  Patient MRN:   [id number]  Referring practice: MFM Referring Provider: [facility]  MFM CONSULT  Ashley Acevedo is a 28 y.o. G1P0000 at [redacted]w[redacted]d here for ultrasound and consultation. 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 During today's visit we focused on the following concerns:   RE Chiari 1 malformation: Patient reports that both her mother and her have Chiari malformation.  The patient never has had any symptoms from this diagnosis.  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  She was advised by her neurologist to discuss the route of delivery with her OB provider.  I recommend that she have an anesthesia consultation to discuss the likelihood of tonsil herniation as well as other options for analgesia during labor and delivery.  It appears new data suggest epidural or combined spinal epidural anesthesia may be considered in this patient population without significant risk. The anesthesiologist will evaluate the risks and benefits associated with each option, considering the specific case. They will consider factors such as the potential impact on intracranial pressure and the feasibility and safety of administering regional anesthesia techniques  I discussed that there are significant concerns for general anesthesia in pregnancy especially in obese patients due to poor airway access due to  the increased swelling of pregnancy in the head and neck region.  I recommend that she talk to Dr. Armond Hang (OB anesthesia) in the third trimester.  RE insulin resistance: The patient was told that she has elevated insulin levels but normal A1c at 5.7.  She does not have PCOS per the patient.  She is currently on metformin.  She had an early glucose screen that was abnormal but a 3-hour was normal.  I discussed that she is still at risk for gestational diabetes.  While metformin is overall acceptable during pregnancy it does cross the placenta and since the patient has not been diagnosed with diabetes it may be advisable that she discontinue metformin and repeat a glucose challenge test in 2 weeks.  This would give a definitive diagnosis on if she has gestational diabetes or not.  RE elevated BMI: Pregravid BMI is around 58.I discussed the potential complications associated with obesity in pregnancy.  These complications include but are not limited to increased risk of excessive maternal weight gain, fetal growth abnormalities, fetal congenital disorders, inability to visualize fetal anatomic structures on ultrasound, gestational diabetes, hypertensive disorders of pregnancy, operative birth including cesarean delivery or assisted vaginal delivery, delayed wound healing and many long-term health complications.  I discussed the need for continued growth ultrasounds and possibly antenatal testing depending upon how the pregnancy course progresses.  Maternal weight gain should be limited to 10 to 20 pounds during the pregnancy.  While normal weight loss may occur during the first and early second trimester, efforts to actively lose weight with the use of medication is not recommended during pregnancy.  A whole food diet and  regular exercise of at least 15 to 30 minutes of moderately strenuous activity is recommended in the absence of any contraindications. Weight loss with the use of medications is not recommended  during pregnancy.  If other existing comorbidities are present then 81 mg of aspirin should be considered for preeclampsia risk reduction.   I reassured the patient that we expect a favorable pregnancy outcome but due to her pregnancy complications she will need a higher level of monitoring for her pregnancy compared to a pregnancy without complications. The patient had time to ask questions that were answered to her satisfaction. She verbalized understanding of our discussion and request to proceed with the plan outlined in the recommendations.   Sonographic findings Single intrauterine pregnancy at 20w 0d  Fetal cardiac activity:  Observed and appears normal. Presentation: Variable. The anatomic structures that were well seen appear normal without evidence of soft markers. Due to poor acoustic windows some structures remain suboptimally visualized. Fetal biometry shows the estimated fetal weight at the 30 percentile.  Amniotic fluid: Within normal limits.  MVP: 7.17 cm. Placenta: Posterior Fundal. Adnexa: No masses visualized.  Cervical length: 4.8 cm.  There are limitations of prenatal ultrasound such as the inability to detect certain abnormalities due to poor visualization. Various factors such as fetal position, gestational age and maternal body habitus may increase the difficulty in visualizing the fetal anatomy.    Recommendations -EDD should be 06/13/2024 based on  LMP  (09/07/23). -Detailed ultrasound was done today without abnormalities but many structures remain suboptimally visualized due to poor acoustic windows -Baseline preeclampsia labs: CMP, CBC and urine protein/creatinine ratio if not previously completed.  -Discontinue metformin for 2 weeks and then have a 2-hour glucose challenge test to see if she meets criteria for gestational diabetes -Continue Aspirin 81 mg for preeclampsia prophylaxis -Follow-up anatomy and fetal growth in 4 to 6 weeks -Serial growth ultrasounds  starting around 28 weeks to monitor for fetal growth restriction -Fetal echo should be considered if heart views are not adequate at next visit due to maternal obesity -Antenatal testing to start around 32 weeks due to the increased risk of stillbirth and high risk pregnancy -Delivery timing pending clinical course but likely around [redacted] weeks gestation -Referral to Dr. Gail Joseph (OB anesthesia) should be made by the [facility] provider to discuss the Chiari malformation -Continue routine prenatal care with referring OB provider  Review of Systems: A review of systems was performed and was negative except per HPI   Vitals and Physical Exam    01/25/2024    9:09 AM 12/23/2023    4:10 PM 12/23/2023    4:00 PM  Vitals with BMI  Height   5\' 8"   Weight   380 lbs  BMI   57.79  Systolic 123 124 161  Diastolic 64 81 72  Pulse 97  105    Sitting comfortably on the sonogram table Nonlabored breathing Normal rate and rhythm Abdomen is nontender  Past pregnancies OB History  Gravida Para Term Preterm AB Living  1 0 0 0 0 0  SAB IAB Ectopic Multiple Live Births  0 0 0 0 0    # Outcome Date GA Lbr Len/2nd Weight Sex Type Anes PTL Lv  1 Current              I spent 60 minutes reviewing the patients chart, including labs and images as well as counseling the patient about her medical conditions. Greater than 50% of the time was spent in  direct face-to-face patient counseling.  Penney Bowling, DO Maternal fetal medicine, [place]   01/25/2024  4:06 PM

## 2024-01-27 ENCOUNTER — Other Ambulatory Visit (INDEPENDENT_AMBULATORY_CARE_PROVIDER_SITE_OTHER): Payer: Self-pay | Admitting: Adult Health

## 2024-01-27 DIAGNOSIS — K219 Gastro-esophageal reflux disease without esophagitis: Secondary | ICD-10-CM

## 2024-02-05 DIAGNOSIS — Z369 Encounter for antenatal screening, unspecified: Secondary | ICD-10-CM | POA: Diagnosis not present

## 2024-02-15 ENCOUNTER — Ambulatory Visit (INDEPENDENT_AMBULATORY_CARE_PROVIDER_SITE_OTHER): Admitting: Adult Health

## 2024-02-15 ENCOUNTER — Encounter (INDEPENDENT_AMBULATORY_CARE_PROVIDER_SITE_OTHER): Payer: Self-pay | Admitting: Adult Health

## 2024-02-15 VITALS — BP 147/75 | HR 100 | Temp 98.2°F | Ht 68.0 in | Wt 385.0 lb

## 2024-02-15 DIAGNOSIS — E669 Obesity, unspecified: Secondary | ICD-10-CM

## 2024-02-15 DIAGNOSIS — E88819 Insulin resistance, unspecified: Secondary | ICD-10-CM

## 2024-02-15 DIAGNOSIS — K219 Gastro-esophageal reflux disease without esophagitis: Secondary | ICD-10-CM | POA: Diagnosis not present

## 2024-02-15 DIAGNOSIS — Z6841 Body Mass Index (BMI) 40.0 and over, adult: Secondary | ICD-10-CM

## 2024-02-15 DIAGNOSIS — Z3A23 23 weeks gestation of pregnancy: Secondary | ICD-10-CM

## 2024-02-15 MED ORDER — METFORMIN HCL 500 MG PO TABS: ORAL_TABLET | ORAL | 0 refills | Status: DC

## 2024-02-15 MED ORDER — PANTOPRAZOLE SODIUM 40 MG PO TBEC: DELAYED_RELEASE_TABLET | ORAL | 0 refills | Status: AC

## 2024-02-15 NOTE — Progress Notes (Signed)
 WEIGHT SUMMARY AND BIOMETRICS  Vitals Temp: 98.2 F (36.8 C) BP: (!) 147/75 Pulse Rate: 100 SpO2: 98 %   Anthropometric Measurements Height: 5\' 8"  (1.727 m) Weight: (!) 385 lb (174.6 kg) BMI (Calculated): 58.55 Weight at Last Visit: 380 lb Weight Lost Since Last Visit: 0 Weight Gained Since Last Visit: 5 lb Starting Weight: 295 lb Total Weight Loss (lbs): 0 lb (0 kg)   Body Composition  Body Fat %: 60.1 % Fat Mass (lbs): 231.8 lbs Muscle Mass (lbs): 146.2 lbs Visceral Fat Rating : 23   Other Clinical Data Fasting: no Labs: no Today's Visit #: 99 Starting Date: 08/24/19    Chief Complaint:   OBESITY Ashley Acevedo is here to discuss her progress with her obesity treatment plan.  She is on the keeping a food journal and adhering to recommended goals of 1800 calories and 100+ protein and states she is following her eating plan approximately 60 % of the time.  She states she is exercising NEAT and Walking- daily.   Interim History:  She is working closely with her OB/GYN and Maternal Fetal Care. It has yet to be determined if she will deliver vaginally vs caesarian section. She has hx of Chiari Malformation  She is having a boy, Ashley Acevedo!!!  She is due 06/13/2024, she hopes be on Maternity Leave until Jan 2026  Subjective:   1. [redacted] weeks gestation of pregnancy She failed her one hour GTT, passed her 3 hour GTT She will repeat study 03/01/2024 She has been craving Sonic [place] Slushy She will enjoy only one Slushy per week. She reports mild fatigue. She denies current/recent GI upset  2. Insulin  resistance Per OB- okay to continue Metformin  therapy She has been focusing on protein intake at each meal She has been craving S[place] Slushy She will enjoy only one Slushy per week. She reports mild f  3. Gastroesophageal reflux disease, unspecified whether esophagitis present Per OB- okay to continue Protonix  therapy She reports acid reflux with well  controlled  Assessment/Plan:   1. [redacted] weeks gestation of pregnancy (Primary) Continue healthy eating, adequate hydration, remain active daily F/u with Maternal Care team as directed  2. Insulin  resistance Refill  metFORMIN  (GLUCOPHAGE ) 500 MG tablet 1 tab daily with meal Dispense: 90 tablet, Refills: 0 ordered   3. Gastroesophageal reflux disease, unspecified whether esophagitis present Refill - pantoprazole  (PROTONIX ) 40 MG tablet; TAKE 1 TABLET(40 MG) BY MOUTH DAILY  Dispense: 90 tablet; Refill: 0  4. Obesity, Beginning BMI 58.7  Ashley Acevedo is not currently in the action stage of change. As such, her goal is to maintain weight for now. She has agreed to keeping a food journal and adhering to recommended goals of 1800 calories and 100+ protein.   Exercise goals: All adults should avoid inactivity. Some physical activity is better than none, and adults who participate in any amount of physical activity gain some health benefits. Adults should also include muscle-strengthening activities that involve all major muscle groups on 2 or more days a week.  Behavioral modification strategies: increasing lean protein intake, decreasing simple carbohydrates, increasing vegetables, increasing water intake, no skipping meals, meal planning and cooking strategies, keeping healthy foods in the home, ways to avoid boredom eating, and planning for success.  Ashley Acevedo has agreed to follow-up with our clinic in 4 weeks. She was informed of the importance of frequent follow-up visits to maximize her success with intensive lifestyle modifications for her multiple health conditions.   Objective:  Blood pressure (!) 147/75, pulse 100, temperature 98.2 F (36.8 C), height 5\' 8"  (1.727 m), weight (!) 385 lb (174.6 kg), last menstrual period 09/07/2023, SpO2 98%. Body mass index is 58.54 kg/m.  General: Cooperative, alert, well developed, in no acute distress. HEENT: Conjunctivae and lids  unremarkable. Cardiovascular: Regular rhythm.  Lungs: Normal work of breathing. Neurologic: No focal deficits.   Lab Results  Component Value Date   CREATININE 0.56 (L) 11/25/2023   BUN 8 11/25/2023   NA 140 11/25/2023   K 4.5 11/25/2023   CL 103 11/25/2023   CO2 20 11/25/2023   Lab Results  Component Value Date   ALT 15 11/25/2023   AST 12 11/25/2023   ALKPHOS 92 11/25/2023   BILITOT 0.2 11/25/2023   Lab Results  Component Value Date   HGBA1C 5.7 11/30/2023   HGBA1C 5.7 (H) 11/25/2023   HGBA1C 6.0 (H) 06/01/2023   HGBA1C 5.7 (H) 11/05/2022   HGBA1C 5.5 03/04/2022   Lab Results  Component Value Date   INSULIN  31.7 (H) 11/25/2023   INSULIN  29.6 (H) 06/01/2023   INSULIN  39.0 (H) 11/05/2022   INSULIN  17.7 03/04/2022   INSULIN  103.0 (H) 08/15/2021   Lab Results  Component Value Date   TSH 2.720 10/01/2022   Lab Results  Component Value Date   CHOL 208 (H) 06/01/2023   HDL 49 06/01/2023   LDLCALC 136 (H) 06/01/2023   TRIG 131 06/01/2023   CHOLHDL 4.2 06/01/2023   Lab Results  Component Value Date   VD25OH 26.5 (L) 11/25/2023   VD25OH 37.5 06/01/2023   VD25OH 17.7 (L) 11/05/2022   Lab Results  Component Value Date   WBC 8.1 12/20/2020   HGB 10.3 (A) 12/08/2023   HCT 33 (A) 12/08/2023   MCV 85 12/20/2020   PLT 326 12/08/2023   Lab Results  Component Value Date   IRON 76 12/20/2020   TIBC 416 12/20/2020   FERRITIN 23 12/20/2020   Attestation Statements:   Reviewed by clinician on day of visit: allergies, medications, problem list, medical history, surgical history, family history, social history, and previous encounter notes.  I have reviewed the above documentation for accuracy and completeness, and I agree with the above. -  Adrie Picking d. Marshawn Ninneman, NP-C

## 2024-02-23 ENCOUNTER — Ambulatory Visit (HOSPITAL_BASED_OUTPATIENT_CLINIC_OR_DEPARTMENT_OTHER): Admitting: Obstetrics

## 2024-02-23 ENCOUNTER — Ambulatory Visit: Attending: Obstetrics and Gynecology

## 2024-02-23 ENCOUNTER — Ambulatory Visit

## 2024-02-23 ENCOUNTER — Other Ambulatory Visit: Payer: Self-pay | Admitting: *Deleted

## 2024-02-23 VITALS — BP 127/55 | HR 102

## 2024-02-23 DIAGNOSIS — O99210 Obesity complicating pregnancy, unspecified trimester: Secondary | ICD-10-CM | POA: Diagnosis not present

## 2024-02-23 DIAGNOSIS — Z3A24 24 weeks gestation of pregnancy: Secondary | ICD-10-CM | POA: Insufficient documentation

## 2024-02-23 DIAGNOSIS — E669 Obesity, unspecified: Secondary | ICD-10-CM

## 2024-02-23 DIAGNOSIS — O99512 Diseases of the respiratory system complicating pregnancy, second trimester: Secondary | ICD-10-CM

## 2024-02-23 DIAGNOSIS — E88819 Insulin resistance, unspecified: Secondary | ICD-10-CM | POA: Diagnosis not present

## 2024-02-23 DIAGNOSIS — Z362 Encounter for other antenatal screening follow-up: Secondary | ICD-10-CM | POA: Insufficient documentation

## 2024-02-23 DIAGNOSIS — O99212 Obesity complicating pregnancy, second trimester: Secondary | ICD-10-CM

## 2024-02-23 DIAGNOSIS — O99810 Abnormal glucose complicating pregnancy: Secondary | ICD-10-CM

## 2024-02-23 DIAGNOSIS — G935 Compression of brain: Secondary | ICD-10-CM

## 2024-02-23 DIAGNOSIS — J45909 Unspecified asthma, uncomplicated: Secondary | ICD-10-CM

## 2024-02-23 DIAGNOSIS — O99352 Diseases of the nervous system complicating pregnancy, second trimester: Secondary | ICD-10-CM

## 2024-02-23 DIAGNOSIS — O99213 Obesity complicating pregnancy, third trimester: Secondary | ICD-10-CM

## 2024-02-23 NOTE — Progress Notes (Unsigned)
 MFM Consult Note

## 2024-03-01 DIAGNOSIS — O99810 Abnormal glucose complicating pregnancy: Secondary | ICD-10-CM | POA: Diagnosis not present

## 2024-03-01 DIAGNOSIS — Z369 Encounter for antenatal screening, unspecified: Secondary | ICD-10-CM | POA: Diagnosis not present

## 2024-03-16 DIAGNOSIS — Z369 Encounter for antenatal screening, unspecified: Secondary | ICD-10-CM | POA: Diagnosis not present

## 2024-03-16 DIAGNOSIS — Z3480 Encounter for supervision of other normal pregnancy, unspecified trimester: Secondary | ICD-10-CM | POA: Diagnosis not present

## 2024-03-16 DIAGNOSIS — O99019 Anemia complicating pregnancy, unspecified trimester: Secondary | ICD-10-CM | POA: Diagnosis not present

## 2024-03-16 LAB — CBC AND DIFFERENTIAL
HCT: 31 — AB (ref 36–46)
Hemoglobin: 9.6 — AB (ref 12.0–16.0)
Platelets: 312 K/uL (ref 150–400)

## 2024-03-22 ENCOUNTER — Other Ambulatory Visit: Payer: Self-pay | Admitting: *Deleted

## 2024-03-22 ENCOUNTER — Ambulatory Visit (HOSPITAL_BASED_OUTPATIENT_CLINIC_OR_DEPARTMENT_OTHER): Admitting: Obstetrics and Gynecology

## 2024-03-22 ENCOUNTER — Ambulatory Visit: Attending: Maternal & Fetal Medicine

## 2024-03-22 VITALS — BP 145/86

## 2024-03-22 DIAGNOSIS — E669 Obesity, unspecified: Secondary | ICD-10-CM | POA: Diagnosis not present

## 2024-03-22 DIAGNOSIS — E88819 Insulin resistance, unspecified: Secondary | ICD-10-CM | POA: Insufficient documentation

## 2024-03-22 DIAGNOSIS — Z3A28 28 weeks gestation of pregnancy: Secondary | ICD-10-CM | POA: Diagnosis not present

## 2024-03-22 DIAGNOSIS — O99513 Diseases of the respiratory system complicating pregnancy, third trimester: Secondary | ICD-10-CM | POA: Diagnosis not present

## 2024-03-22 DIAGNOSIS — O99213 Obesity complicating pregnancy, third trimester: Secondary | ICD-10-CM

## 2024-03-22 DIAGNOSIS — O99210 Obesity complicating pregnancy, unspecified trimester: Secondary | ICD-10-CM | POA: Diagnosis not present

## 2024-03-22 DIAGNOSIS — J45909 Unspecified asthma, uncomplicated: Secondary | ICD-10-CM | POA: Diagnosis not present

## 2024-03-22 DIAGNOSIS — O99810 Abnormal glucose complicating pregnancy: Secondary | ICD-10-CM

## 2024-03-22 NOTE — Progress Notes (Signed)
 Maternal-Fetal Medicine Consultation Name: Vonette Grosso MRN: [id number]  G1 P0 at 28w 1d gestation.  Patient is here for fetal growth assessment and completion of fetal anatomy. She does not have gestational diabetes.  Blood pressures today at our office where 145/86 and 139/85 mmHg. Pregravid BMI 59.  Patient has Arnold-Chiari malformation type I. Patient takes metformin  as advised by you because of increased fasting level on 3-hour GTT.  Ultrasound Amniotic fluid is normal good fetal activity seen.  Fetal growth is appropriate for gestational age.  Breech presentation.  Some fetal anatomical structures were evaluated and appears normal.  Arnold-Chiari malformation type I  I counseled the patient that vaginal delivery can be safely attempted and no adverse outcomes are expected during labor.  Pregravid BMI 59 Grade 3 obesity is associated with increased risk of perinatal mortality (2.5- 3-fold increase), but the absolute risk is very small.  I discussed our ultrasound protocol of weekly antenatal testing from [redacted] weeks gestation till delivery.  Recommendations - An appointment was made for her to return in 6 weeks for fetal growth assessment and BPP.  If you prefer to do her weekly antenatal testing at your office, kindly cancel our appointment.  Consultation including face-to-face (more than 50%) counseling 20 minutes.

## 2024-03-30 DIAGNOSIS — Z23 Encounter for immunization: Secondary | ICD-10-CM | POA: Diagnosis not present

## 2024-03-31 ENCOUNTER — Telehealth: Payer: Self-pay

## 2024-03-31 NOTE — Telephone Encounter (Signed)
 Copied from CRM [id number]. Topic: General - Other >> Mar 31, 2024 12:25 PM [place] A wrote: Reason for CRM: Please call patient to schedule Iron Infusion

## 2024-04-04 ENCOUNTER — Encounter (INDEPENDENT_AMBULATORY_CARE_PROVIDER_SITE_OTHER): Payer: Self-pay | Admitting: Adult Health

## 2024-04-04 ENCOUNTER — Ambulatory Visit (INDEPENDENT_AMBULATORY_CARE_PROVIDER_SITE_OTHER): Admitting: Adult Health

## 2024-04-04 DIAGNOSIS — E88819 Insulin resistance, unspecified: Secondary | ICD-10-CM

## 2024-04-04 DIAGNOSIS — Z3A23 23 weeks gestation of pregnancy: Secondary | ICD-10-CM

## 2024-04-04 DIAGNOSIS — E559 Vitamin D deficiency, unspecified: Secondary | ICD-10-CM

## 2024-04-04 DIAGNOSIS — F3289 Other specified depressive episodes: Secondary | ICD-10-CM

## 2024-04-04 DIAGNOSIS — Z6841 Body Mass Index (BMI) 40.0 and over, adult: Secondary | ICD-10-CM

## 2024-04-04 DIAGNOSIS — E669 Obesity, unspecified: Secondary | ICD-10-CM

## 2024-04-04 DIAGNOSIS — F5089 Other specified eating disorder: Secondary | ICD-10-CM | POA: Diagnosis not present

## 2024-04-04 MED ORDER — ESCITALOPRAM OXALATE 10 MG PO TABS: 10.0000 mg | ORAL_TABLET | Freq: Every day | ORAL | 0 refills | Status: DC

## 2024-04-04 NOTE — Progress Notes (Signed)
 WEIGHT SUMMARY AND BIOMETRICS  Vitals Temp: 99.1 F (37.3 C) BP: 118/75 Pulse Rate: (!) 103 SpO2: 93 %   Anthropometric Measurements Height: 5' 8 (1.727 m) Weight: (!) 386 lb (175.1 kg) BMI (Calculated): 58.7 Weight at Last Visit: 385 lb Weight Lost Since Last Visit: 0 Weight Gained Since Last Visit: 1 lb Starting Weight: 295 lb Total Weight Loss (lbs): 0 lb (0 kg)   Body Composition  Body Fat %: 58.8 % Fat Mass (lbs): 227 lbs Muscle Mass (lbs): 151.2 lbs Visceral Fat Rating : 23   Other Clinical Data Fasting: no Labs: no Today's Visit #: 50 Starting Date: 08/24/19    Chief Complaint:   OBESITY Ashley Acevedo is here to discuss her progress with her obesity treatment plan.  She is on the keeping a food journal and adhering to recommended goals of 1800 calories and 100g+ protein and states she is following her eating plan approximately 50 % of the time.  She states she is exercising Walking 60 minutes 1-2 times per week.  Interim History:  She passed second GTT, however her CBG was high normal Her OB increased her Metformin  500mg  from every day to BID- She was unable to tolerate increase, re: GI upset She has backed down to Metformin  500mg  every day and feeling well on this dose.  She is scheduled for Iron Infusion this Wednesday.  She was found to Rh +  Hunger/appetite-stable appetite  She is scheduled fro Cesarean Section 06/02/2024  Subjective:   1. Vitamin D  deficiency  Latest Reference Range & Units 11/05/22 09:19 06/01/23 09:42 11/25/23 08:37  Vitamin D , 25-Hydroxy 30.0 - 100.0 ng/mL 17.7 (L) 37.5 26.5 (L)  (L): Data is abnormally low  She stopped Ergocalciferol  once pregnancy confirmed She is now currently on daily PreNatal MVI  2. Insulin  resistance Lab Results  Component Value Date   HGBA1C 5.7 11/30/2023   HGBA1C 5.7 (H) 11/25/2023   HGBA1C 6.0 (H) 06/01/2023     Latest Reference Range & Units 11/05/22 09:19 06/01/23 09:42 11/25/23  08:37  INSULIN  2.6 - 24.9 uIU/mL 39.0 (H) 29.6 (H) 31.7 (H)  (H): Data is abnormally high  She passed second GTT, however her CBG was high normal Her OB increased her Metformin  500mg  from every day to BID- She was unable to tolerate increase, re: GI upset She has backed down to Metformin  500mg  every day and feeling well on this dose.  3. [redacted] weeks gestation of pregnancy She passed second GTT, however her CBG was high normal Her OB increased her Metformin  500mg  from every day to BID- She was unable to tolerate increase, re: GI upset She has backed down to Metformin  500mg  every day and feeling well on this dose.  She is scheduled for Iron Infusion this Wednesday.  She was found to Rh +  She is scheduled fro Cesarean Section 06/02/2024  4. Other depression, with emotional eating She reports stable mood, denies SI/HI All of her maternal health care providers have endorsed her remain She is on Lexapro  10mg  daily  Assessment/Plan:   1. Vitamin D  deficiency Continue Prenatal MVI Remain off Ergocalciferol   2. Insulin  resistance Continue Metformin  500mg - does not require refill today  3. [redacted] weeks gestation of pregnancy Continue healthy eating and regular walking  4. Other depression, with emotional eating Refill - escitalopram  (LEXAPRO ) 10 MG tablet; Take 1 tablet (10 mg total) by mouth daily.  Dispense: 90 tablet; Refill: 0  5. Obesity, current BMI 58.7  Ashley Acevedo is  not currently in the action stage of change. As such, her goal is to maintain weight for now. She has agreed to practicing portion control and making smarter food choices, such as increasing vegetables and decreasing simple carbohydrates.   Exercise goals: All adults should avoid inactivity. Some physical activity is better than none, and adults who participate in any amount of physical activity gain some health benefits. Adults should also include muscle-strengthening activities that involve all major muscle groups on 2  or more days a week.  Behavioral modification strategies: increasing lean protein intake, decreasing simple carbohydrates, increasing vegetables, increasing water intake, no skipping meals, meal planning and cooking strategies, keeping healthy foods in the home, ways to avoid boredom eating, and planning for success.  Ashley Acevedo has agreed to follow-up with our clinic in 8 weeks. She was informed of the importance of frequent follow-up visits to maximize her success with intensive lifestyle modifications for her multiple health conditions.   Objective:   Blood pressure 118/75, pulse (!) 103, temperature 99.1 F (37.3 C), height 5' 8 (1.727 m), weight (!) 386 lb (175.1 kg), last menstrual period 09/07/2023, SpO2 93%. Body mass index is 58.69 kg/m.  General: Cooperative, alert, well developed, in no acute distress. HEENT: Conjunctivae and lids unremarkable. Cardiovascular: Regular rhythm.  Lungs: Normal work of breathing. Neurologic: No focal deficits.   Lab Results  Component Value Date   CREATININE 0.56 (L) 11/25/2023   BUN 8 11/25/2023   NA 140 11/25/2023   K 4.5 11/25/2023   CL 103 11/25/2023   CO2 20 11/25/2023   Lab Results  Component Value Date   ALT 15 11/25/2023   AST 12 11/25/2023   ALKPHOS 92 11/25/2023   BILITOT 0.2 11/25/2023   Lab Results  Component Value Date   HGBA1C 5.7 11/30/2023   HGBA1C 5.7 (H) 11/25/2023   HGBA1C 6.0 (H) 06/01/2023   HGBA1C 5.7 (H) 11/05/2022   HGBA1C 5.5 03/04/2022   Lab Results  Component Value Date   INSULIN  31.7 (H) 11/25/2023   INSULIN  29.6 (H) 06/01/2023   INSULIN  39.0 (H) 11/05/2022   INSULIN  17.7 03/04/2022   INSULIN  103.0 (H) 08/15/2021   Lab Results  Component Value Date   TSH 2.720 10/01/2022   Lab Results  Component Value Date   CHOL 208 (H) 06/01/2023   HDL 49 06/01/2023   LDLCALC 136 (H) 06/01/2023   TRIG 131 06/01/2023   CHOLHDL 4.2 06/01/2023   Lab Results  Component Value Date   VD25OH 26.5 (L)  11/25/2023   VD25OH 37.5 06/01/2023   VD25OH 17.7 (L) 11/05/2022   Lab Results  Component Value Date   WBC 8.1 12/20/2020   HGB 10.3 (A) 12/08/2023   HCT 33 (A) 12/08/2023   MCV 85 12/20/2020   PLT 326 12/08/2023   Lab Results  Component Value Date   IRON 76 12/20/2020   TIBC 416 12/20/2020   FERRITIN 23 12/20/2020   Attestation Statements:   Reviewed by clinician on day of visit: allergies, medications, problem list, medical history, surgical history, family history, social history, and previous encounter notes.  I have reviewed the above documentation for accuracy and completeness, and I agree with the above. -  Laurent Cargile d. Uzoma Vivona, NP-C

## 2024-04-06 ENCOUNTER — Non-Acute Institutional Stay (HOSPITAL_COMMUNITY)
Admission: RE | Admit: 2024-04-06 | Discharge: 2024-04-06 | Disposition: A | Discharge status: Home / Self Care | Source: Ambulatory Visit | Attending: Internal Medicine | Admitting: Internal Medicine

## 2024-04-06 DIAGNOSIS — D509 Iron deficiency anemia, unspecified: Secondary | ICD-10-CM | POA: Insufficient documentation

## 2024-04-06 DIAGNOSIS — O99013 Anemia complicating pregnancy, third trimester: Secondary | ICD-10-CM | POA: Diagnosis not present

## 2024-04-06 MED ORDER — SODIUM CHLORIDE 0.9 % IV SOLN: INTRAVENOUS | Status: DC | PRN

## 2024-04-06 MED ORDER — SODIUM CHLORIDE 0.9 % IV SOLN
510.0000 mg | INTRAVENOUS | Status: DC
  Administered 2024-04-06: 510 mg via INTRAVENOUS
  Filled 2024-04-06: qty 17

## 2024-04-06 NOTE — Progress Notes (Signed)
 PATIENT CARE CENTER NOTE:  Diagnosis: O99.013 Anemia complicating pregnancy in third trimester ; D50.9 Iron deficiency anemia , unspecified.  Provider: Marjorie Perley Gull MD  Procedure: Allegra 510mg  infusion  Patient received IV Feraheme ( dose #1 of 2). Observed for at least 30 minutes post infusion. Tolerated well, vitals stable, discharge instructions given , verbalized understanding. Pt states she is out of town next week, will delay next infusion. Scheduled for next iron infusion on 04/19/24, provider's office notified via fax. Patient alert, oriented, and ambulatory at the time of discharge, accompanied by mother.

## 2024-04-11 DIAGNOSIS — Z3A31 31 weeks gestation of pregnancy: Secondary | ICD-10-CM | POA: Diagnosis not present

## 2024-04-11 DIAGNOSIS — O133 Gestational [pregnancy-induced] hypertension without significant proteinuria, third trimester: Secondary | ICD-10-CM | POA: Diagnosis not present

## 2024-04-11 DIAGNOSIS — O358XX9 Maternal care for other (suspected) fetal abnormality and damage, other fetus: Secondary | ICD-10-CM | POA: Diagnosis not present

## 2024-04-19 ENCOUNTER — Ambulatory Visit (HOSPITAL_COMMUNITY)
Admission: RE | Admit: 2024-04-19 | Discharge: 2024-04-19 | Disposition: A | Discharge status: Home / Self Care | Source: Ambulatory Visit | Attending: Internal Medicine | Admitting: Internal Medicine

## 2024-04-19 DIAGNOSIS — O99013 Anemia complicating pregnancy, third trimester: Secondary | ICD-10-CM | POA: Insufficient documentation

## 2024-04-19 DIAGNOSIS — Z3A30 30 weeks gestation of pregnancy: Secondary | ICD-10-CM | POA: Diagnosis not present

## 2024-04-19 MED ORDER — SODIUM CHLORIDE 0.9 % IV SOLN: INTRAVENOUS | Status: DC | PRN

## 2024-04-19 MED ORDER — SODIUM CHLORIDE 0.9 % IV SOLN
510.0000 mg | Freq: Once | INTRAVENOUS | Status: AC
  Administered 2024-04-19: 510 mg via INTRAVENOUS
  Filled 2024-04-19: qty 17

## 2024-04-19 NOTE — Progress Notes (Addendum)
 PATIENT CARE CENTER NOTE:   Diagnosis: O99.013 Anemia complicating pregnancy in third trimester ; D50.9 Iron deficiency anemia , unspecified.   Provider: Marjorie Perley Gull MD   Procedure: Allegra 510mg  infusion   Note: Patient received IV Feraheme ( dose #2 of 2) via PIV. Half way through infusion, patient reported cramping in lower back, bilateral sides and legs. Infusion stopped and vital signs taken. BP had dropped from 133/75 (pre-infusion) to 103/58 (infusion stopped). Symptoms resolved once infusion stopped. Called [facility] and spoke with staff, Darrelyn, to notify them about patient's reaction. Per Dr. Merinone, alright for patient to proceed with infusion since she tolerated the last infusion well with no issues. Advised patient of Dr. Dominick recommendation. Patient declined to proceed with infusion due to concern of possible repeat reaction.  Patient has an appointment with her OBGYN next week and will discuss plan of care then. Notified Cristy of patient's decision not to proceed with infusion. Monitored patient for 30 minutes after infusion stopped. All symptoms of reaction had resolved. AVS offered but patient declined. BP 143/79 prior to discharge. Patient alert, oriented and ambulatory at discharge.

## 2024-04-27 DIAGNOSIS — I10 Essential (primary) hypertension: Secondary | ICD-10-CM | POA: Diagnosis not present

## 2024-04-27 DIAGNOSIS — Z3A33 33 weeks gestation of pregnancy: Secondary | ICD-10-CM | POA: Diagnosis not present

## 2024-04-27 DIAGNOSIS — O24415 Gestational diabetes mellitus in pregnancy, controlled by oral hypoglycemic drugs: Secondary | ICD-10-CM | POA: Diagnosis not present

## 2024-04-27 DIAGNOSIS — O99213 Obesity complicating pregnancy, third trimester: Secondary | ICD-10-CM | POA: Diagnosis not present

## 2024-05-03 ENCOUNTER — Ambulatory Visit

## 2024-05-03 DIAGNOSIS — Z3A34 34 weeks gestation of pregnancy: Secondary | ICD-10-CM | POA: Diagnosis not present

## 2024-05-03 DIAGNOSIS — O99013 Anemia complicating pregnancy, third trimester: Secondary | ICD-10-CM | POA: Diagnosis not present

## 2024-05-03 DIAGNOSIS — O99019 Anemia complicating pregnancy, unspecified trimester: Secondary | ICD-10-CM | POA: Diagnosis not present

## 2024-05-03 LAB — CBC AND DIFFERENTIAL
HCT: 33 — AB (ref 36–46)
Hemoglobin: 10.6 — AB (ref 12.0–16.0)
Platelets: 268 K/uL (ref 150–400)

## 2024-05-10 ENCOUNTER — Other Ambulatory Visit

## 2024-05-10 ENCOUNTER — Ambulatory Visit

## 2024-05-13 DIAGNOSIS — Z3A35 35 weeks gestation of pregnancy: Secondary | ICD-10-CM | POA: Diagnosis not present

## 2024-05-13 DIAGNOSIS — O10013 Pre-existing essential hypertension complicating pregnancy, third trimester: Secondary | ICD-10-CM | POA: Diagnosis not present

## 2024-05-16 ENCOUNTER — Telehealth (HOSPITAL_COMMUNITY): Payer: Self-pay | Admitting: *Deleted

## 2024-05-16 NOTE — Telephone Encounter (Signed)
 Called [facility] regarding patient's upcoming iron infusion and previous reaction to iron. Patient had a possible reaction to her second  Feraheme infusion and was only able to receive half the dose. Patient now has orders to receive an additional dose of Feraheme and is scheduled to come in this Friday.  Spoke with nurse, Rosina at [facility] about the patient possibly getting pre-medicated prior to her Feraheme infusion. Per the on-call MD, Dr Claire, patient can receive 500 mg Tylenol  and 25 mg Benadryl  PO prior to infusion this Friday.

## 2024-05-17 DIAGNOSIS — O10013 Pre-existing essential hypertension complicating pregnancy, third trimester: Secondary | ICD-10-CM | POA: Diagnosis not present

## 2024-05-17 DIAGNOSIS — Z3480 Encounter for supervision of other normal pregnancy, unspecified trimester: Secondary | ICD-10-CM | POA: Diagnosis not present

## 2024-05-17 DIAGNOSIS — Z3A36 36 weeks gestation of pregnancy: Secondary | ICD-10-CM | POA: Diagnosis not present

## 2024-05-18 ENCOUNTER — Encounter: Payer: Self-pay | Admitting: Physician Assistant

## 2024-05-18 DIAGNOSIS — L814 Other melanin hyperpigmentation: Secondary | ICD-10-CM | POA: Diagnosis not present

## 2024-05-18 DIAGNOSIS — Z808 Family history of malignant neoplasm of other organs or systems: Secondary | ICD-10-CM | POA: Diagnosis not present

## 2024-05-18 DIAGNOSIS — D225 Melanocytic nevi of trunk: Secondary | ICD-10-CM | POA: Diagnosis not present

## 2024-05-18 DIAGNOSIS — L578 Other skin changes due to chronic exposure to nonionizing radiation: Secondary | ICD-10-CM | POA: Diagnosis not present

## 2024-05-19 LAB — OB RESULTS CONSOLE GBS: GBS: NEGATIVE

## 2024-05-20 ENCOUNTER — Encounter (HOSPITAL_COMMUNITY): Payer: Self-pay

## 2024-05-20 ENCOUNTER — Non-Acute Institutional Stay (HOSPITAL_COMMUNITY)
Admission: RE | Admit: 2024-05-20 | Discharge: 2024-05-20 | Disposition: A | Discharge status: Home / Self Care | Source: Ambulatory Visit | Attending: Internal Medicine | Admitting: Internal Medicine

## 2024-05-20 DIAGNOSIS — O99013 Anemia complicating pregnancy, third trimester: Secondary | ICD-10-CM | POA: Insufficient documentation

## 2024-05-20 DIAGNOSIS — D509 Iron deficiency anemia, unspecified: Secondary | ICD-10-CM | POA: Diagnosis present

## 2024-05-20 MED ORDER — SODIUM CHLORIDE 0.9 % IV SOLN
510.0000 mg | Freq: Once | INTRAVENOUS | Status: DC
  Filled 2024-05-20: qty 17

## 2024-05-20 MED ORDER — DIPHENHYDRAMINE HCL 25 MG PO CAPS
25.0000 mg | ORAL_CAPSULE | Freq: Once | ORAL | Status: AC
  Administered 2024-05-20: 25 mg via ORAL
  Filled 2024-05-20: qty 1

## 2024-05-20 MED ORDER — SODIUM CHLORIDE 0.9 % IV SOLN: INTRAVENOUS | Status: DC | PRN

## 2024-05-20 MED ORDER — ACETAMINOPHEN 500 MG PO TABS
500.0000 mg | ORAL_TABLET | Freq: Once | ORAL | Status: AC
  Administered 2024-05-20: 500 mg via ORAL
  Filled 2024-05-20: qty 1

## 2024-05-20 NOTE — Patient Instructions (Addendum)
 Ashley Acevedo  05/20/2024   Your procedure is scheduled on:  06/02/2024  Arrive at 0530 at Entrance C on [employer] at [facility]  and [employer]. You are invited to use the FREE valet parking or use the Visitor's parking deck.  Pick up the phone at the desk and dial [phone].  Call this number if you have problems the morning of surgery: [phone]  Remember:   Do not eat food:(After Midnight) Desps de medianoche.  You may drink clear liquids until  __0330___.  Clear liquids means a liquid you can see thru.  It can have color such as Cola or Kool aid.  Tea is OK and coffee as long as no milk or creamer of any kind.  Take these medicines the morning of surgery with A SIP OF WATER:  Take lexapro  and pantoprazole  as prescribed take nifedipine as prescribed as well   Do not wear jewelry, make-up or nail polish.  Do not wear lotions, powders, or perfumes. Do not wear deodorant.  Do not shave 48 hours prior to surgery.  Do not bring valuables to the hospital.  [facility] is not   responsible for any belongings or valuables brought to the hospital.  Contacts, dentures or bridgework may not be worn into surgery.  Leave suitcase in the car. After surgery it may be brought to your room.  For patients admitted to the hospital, checkout time is 11:00 AM the day of              discharge.      Please read over the following fact sheets that you were given:     Preparing for Surgery

## 2024-05-20 NOTE — Progress Notes (Signed)
 PATIENT CARE CENTER NOTE:  Diagnosis: O99.013 anemia complicating pregnancy , third trimester ; D50.9 iron deficiency anemia , unspecified   Provider: Rosaline Chapel MD/ Webster MD   Procedure: Allegra 510mg  infusion   Patient received IV Feraheme ( dose #1 of 1). Pt premedicated with Tylenol  PO and Benadryl  PO for hx of rxn to iron on 7/8.  Observed for at least 30 minutes post infusion. Tolerated well, no adverse reaction noted. Vitals stable, discharge instructions given , verbalized understanding. Patient alert, oriented, and ambulatory at the time of discharge, accompanied by mother.

## 2024-05-24 DIAGNOSIS — O10013 Pre-existing essential hypertension complicating pregnancy, third trimester: Secondary | ICD-10-CM | POA: Diagnosis not present

## 2024-05-24 DIAGNOSIS — Z3A37 37 weeks gestation of pregnancy: Secondary | ICD-10-CM | POA: Diagnosis not present

## 2024-05-30 ENCOUNTER — Other Ambulatory Visit (INDEPENDENT_AMBULATORY_CARE_PROVIDER_SITE_OTHER): Payer: Self-pay | Admitting: Adult Health

## 2024-05-30 DIAGNOSIS — K219 Gastro-esophageal reflux disease without esophagitis: Secondary | ICD-10-CM

## 2024-05-30 DIAGNOSIS — I10 Essential (primary) hypertension: Secondary | ICD-10-CM | POA: Diagnosis not present

## 2024-05-31 ENCOUNTER — Encounter (HOSPITAL_COMMUNITY)
Admission: RE | Admit: 2024-05-31 | Discharge: 2024-05-31 | Disposition: A | Discharge status: Home / Self Care | Source: Ambulatory Visit | Attending: Obstetrics

## 2024-05-31 ENCOUNTER — Encounter (INDEPENDENT_AMBULATORY_CARE_PROVIDER_SITE_OTHER): Payer: Self-pay | Admitting: Adult Health

## 2024-05-31 ENCOUNTER — Other Ambulatory Visit (INDEPENDENT_AMBULATORY_CARE_PROVIDER_SITE_OTHER): Payer: Self-pay | Admitting: *Deleted

## 2024-05-31 DIAGNOSIS — F419 Anxiety disorder, unspecified: Secondary | ICD-10-CM | POA: Diagnosis not present

## 2024-05-31 DIAGNOSIS — E88819 Insulin resistance, unspecified: Secondary | ICD-10-CM | POA: Diagnosis not present

## 2024-05-31 DIAGNOSIS — D509 Iron deficiency anemia, unspecified: Secondary | ICD-10-CM | POA: Diagnosis not present

## 2024-05-31 DIAGNOSIS — G935 Compression of brain: Secondary | ICD-10-CM | POA: Insufficient documentation

## 2024-05-31 DIAGNOSIS — Z01812 Encounter for preprocedural laboratory examination: Secondary | ICD-10-CM | POA: Insufficient documentation

## 2024-05-31 DIAGNOSIS — K219 Gastro-esophageal reflux disease without esophagitis: Secondary | ICD-10-CM | POA: Diagnosis not present

## 2024-05-31 DIAGNOSIS — O99214 Obesity complicating childbirth: Secondary | ICD-10-CM | POA: Diagnosis not present

## 2024-05-31 DIAGNOSIS — O26893 Other specified pregnancy related conditions, third trimester: Secondary | ICD-10-CM | POA: Diagnosis not present

## 2024-05-31 DIAGNOSIS — O9902 Anemia complicating childbirth: Secondary | ICD-10-CM | POA: Diagnosis not present

## 2024-05-31 DIAGNOSIS — O99344 Other mental disorders complicating childbirth: Secondary | ICD-10-CM | POA: Diagnosis not present

## 2024-05-31 DIAGNOSIS — O1002 Pre-existing essential hypertension complicating childbirth: Secondary | ICD-10-CM | POA: Diagnosis not present

## 2024-05-31 DIAGNOSIS — O9962 Diseases of the digestive system complicating childbirth: Secondary | ICD-10-CM | POA: Diagnosis not present

## 2024-05-31 DIAGNOSIS — O10013 Pre-existing essential hypertension complicating pregnancy, third trimester: Secondary | ICD-10-CM | POA: Diagnosis not present

## 2024-05-31 DIAGNOSIS — Z3A38 38 weeks gestation of pregnancy: Secondary | ICD-10-CM | POA: Diagnosis not present

## 2024-05-31 DIAGNOSIS — O99354 Diseases of the nervous system complicating childbirth: Secondary | ICD-10-CM | POA: Diagnosis not present

## 2024-05-31 DIAGNOSIS — Q079 Congenital malformation of nervous system, unspecified: Secondary | ICD-10-CM | POA: Diagnosis not present

## 2024-05-31 HISTORY — DX: Anemia, unspecified: D64.9

## 2024-05-31 HISTORY — DX: Gestational (pregnancy-induced) hypertension without significant proteinuria, unspecified trimester: O13.9

## 2024-05-31 LAB — CBC
HCT: 35.5 % — ABNORMAL LOW (ref 36.0–46.0)
Hemoglobin: 11.1 g/dL — ABNORMAL LOW (ref 12.0–15.0)
MCH: 27.1 pg (ref 26.0–34.0)
MCHC: 31.3 g/dL (ref 30.0–36.0)
MCV: 86.6 fL (ref 80.0–100.0)
Platelets: 245 K/uL (ref 150–400)
RBC: 4.1 MIL/uL (ref 3.87–5.11)
RDW: 18.2 % — ABNORMAL HIGH (ref 11.5–15.5)
WBC: 9.3 K/uL (ref 4.0–10.5)
nRBC: 0 % (ref 0.0–0.2)

## 2024-05-31 LAB — TYPE AND SCREEN
ABO/RH(D): O POS
Antibody Screen: NEGATIVE

## 2024-05-31 LAB — COMPREHENSIVE METABOLIC PANEL WITH GFR
ALT: 11 U/L (ref 0–44)
AST: 12 U/L — ABNORMAL LOW (ref 15–41)
Albumin: 2.7 g/dL — ABNORMAL LOW (ref 3.5–5.0)
Alkaline Phosphatase: 69 U/L (ref 38–126)
Anion gap: 12 (ref 5–15)
BUN: 5 mg/dL — ABNORMAL LOW (ref 6–20)
CO2: 22 mmol/L (ref 22–32)
Calcium: 8.7 mg/dL — ABNORMAL LOW (ref 8.9–10.3)
Chloride: 101 mmol/L (ref 98–111)
Creatinine, Ser: 0.49 mg/dL (ref 0.44–1.00)
GFR, Estimated: >60 mL/min (ref >60)
Glucose, Bld: 81 mg/dL (ref 70–99)
Potassium: 3.8 mmol/L (ref 3.5–5.1)
Sodium: 135 mmol/L (ref 135–145)
Total Bilirubin: 0.6 mg/dL (ref 0.0–1.2)
Total Protein: 6.3 g/dL — ABNORMAL LOW (ref 6.5–8.1)

## 2024-05-31 LAB — RPR: RPR Ser Ql: NONREACTIVE

## 2024-06-02 ENCOUNTER — Inpatient Hospital Stay (HOSPITAL_COMMUNITY): Admitting: Anesthesiology

## 2024-06-02 ENCOUNTER — Other Ambulatory Visit: Payer: Self-pay

## 2024-06-02 ENCOUNTER — Encounter (HOSPITAL_COMMUNITY): Payer: Self-pay | Admitting: Obstetrics

## 2024-06-02 ENCOUNTER — Inpatient Hospital Stay (HOSPITAL_COMMUNITY)
Admission: RE | Admit: 2024-06-02 | Discharge: 2024-06-04 | DRG: 786 | Disposition: A | Discharge status: Home / Self Care | Attending: Obstetrics | Admitting: Obstetrics

## 2024-06-02 ENCOUNTER — Encounter (HOSPITAL_COMMUNITY)
Admission: RE | Disposition: A | Discharge status: Home / Self Care | Payer: Self-pay | Source: Home / Self Care | Attending: Obstetrics

## 2024-06-02 DIAGNOSIS — D509 Iron deficiency anemia, unspecified: Secondary | ICD-10-CM | POA: Diagnosis present

## 2024-06-02 DIAGNOSIS — F419 Anxiety disorder, unspecified: Secondary | ICD-10-CM | POA: Diagnosis present

## 2024-06-02 DIAGNOSIS — O99354 Diseases of the nervous system complicating childbirth: Principal | ICD-10-CM | POA: Diagnosis present

## 2024-06-02 DIAGNOSIS — O9962 Diseases of the digestive system complicating childbirth: Secondary | ICD-10-CM | POA: Diagnosis present

## 2024-06-02 DIAGNOSIS — E88819 Insulin resistance, unspecified: Secondary | ICD-10-CM | POA: Diagnosis present

## 2024-06-02 DIAGNOSIS — O99214 Obesity complicating childbirth: Secondary | ICD-10-CM | POA: Diagnosis present

## 2024-06-02 DIAGNOSIS — K219 Gastro-esophageal reflux disease without esophagitis: Secondary | ICD-10-CM | POA: Diagnosis present

## 2024-06-02 DIAGNOSIS — O26893 Other specified pregnancy related conditions, third trimester: Secondary | ICD-10-CM | POA: Diagnosis present

## 2024-06-02 DIAGNOSIS — G935 Compression of brain: Principal | ICD-10-CM | POA: Diagnosis present

## 2024-06-02 DIAGNOSIS — O9902 Anemia complicating childbirth: Secondary | ICD-10-CM | POA: Diagnosis present

## 2024-06-02 DIAGNOSIS — O1002 Pre-existing essential hypertension complicating childbirth: Secondary | ICD-10-CM | POA: Diagnosis present

## 2024-06-02 DIAGNOSIS — O99892 Other specified diseases and conditions complicating childbirth: Secondary | ICD-10-CM

## 2024-06-02 DIAGNOSIS — Z3A38 38 weeks gestation of pregnancy: Secondary | ICD-10-CM

## 2024-06-02 DIAGNOSIS — O99344 Other mental disorders complicating childbirth: Secondary | ICD-10-CM | POA: Diagnosis present

## 2024-06-02 DIAGNOSIS — O10013 Pre-existing essential hypertension complicating pregnancy, third trimester: Secondary | ICD-10-CM | POA: Diagnosis present

## 2024-06-02 LAB — ABO/RH: ABO/RH(D): O POS

## 2024-06-02 SURGERY — Surgical Case
Anesthesia: Epidural

## 2024-06-02 MED ORDER — ONDANSETRON HCL 4 MG/2ML IJ SOLN
INTRAMUSCULAR | Status: AC
  Filled 2024-06-02: qty 2

## 2024-06-02 MED ORDER — ACETAMINOPHEN 10 MG/ML IV SOLN
INTRAVENOUS | Status: AC
  Filled 2024-06-02: qty 100

## 2024-06-02 MED ORDER — KETOROLAC TROMETHAMINE 30 MG/ML IJ SOLN
30.0000 mg | Freq: Four times a day (QID) | INTRAMUSCULAR | Status: DC | PRN
  Administered 2024-06-02: 30 mg via INTRAVENOUS

## 2024-06-02 MED ORDER — LIDOCAINE-EPINEPHRINE (PF) 2 %-1:200000 IJ SOLN: INTRAMUSCULAR | Status: DC | PRN

## 2024-06-02 MED ORDER — ACETAMINOPHEN 500 MG PO TABS
1000.0000 mg | ORAL_TABLET | Freq: Four times a day (QID) | ORAL | Status: DC
  Administered 2024-06-02 – 2024-06-04 (×8): 1000 mg via ORAL
  Filled 2024-06-02 (×8): qty 2

## 2024-06-02 MED ORDER — ESCITALOPRAM OXALATE 10 MG PO TABS
10.0000 mg | ORAL_TABLET | Freq: Every day | ORAL | Status: DC
  Administered 2024-06-03 – 2024-06-04 (×2): 10 mg via ORAL
  Filled 2024-06-02 (×2): qty 1

## 2024-06-02 MED ORDER — KETOROLAC TROMETHAMINE 30 MG/ML IJ SOLN: 30.0000 mg | Freq: Four times a day (QID) | INTRAMUSCULAR | Status: DC | PRN

## 2024-06-02 MED ORDER — OXYTOCIN-SODIUM CHLORIDE 30-0.9 UT/500ML-% IV SOLN
INTRAVENOUS | Status: DC | PRN
  Administered 2024-06-02: 300 mL via INTRAVENOUS

## 2024-06-02 MED ORDER — ONDANSETRON HCL 4 MG/2ML IJ SOLN
INTRAMUSCULAR | Status: DC | PRN
  Administered 2024-06-02: 4 mg via INTRAVENOUS

## 2024-06-02 MED ORDER — MORPHINE SULFATE (PF) 0.5 MG/ML IJ SOLN
INTRAMUSCULAR | Status: AC
  Filled 2024-06-02: qty 10

## 2024-06-02 MED ORDER — SODIUM CHLORIDE 0.9% FLUSH
3.0000 mL | INTRAVENOUS | Status: DC | PRN
Start: 2024-06-02 — End: 2024-06-04

## 2024-06-02 MED ORDER — NALOXONE HCL 4 MG/10ML IJ SOLN: 1.0000 ug/kg/h | INTRAVENOUS | Status: DC | PRN

## 2024-06-02 MED ORDER — NIFEDIPINE ER OSMOTIC RELEASE 30 MG PO TB24
90.0000 mg | ORAL_TABLET | Freq: Every day | ORAL | Status: DC
  Administered 2024-06-03 – 2024-06-04 (×2): 90 mg via ORAL
  Filled 2024-06-02 (×2): qty 3

## 2024-06-02 MED ORDER — NIFEDIPINE ER OSMOTIC RELEASE 30 MG PO TB24: 30.0000 mg | ORAL_TABLET | Freq: Every day | ORAL | Status: DC

## 2024-06-02 MED ORDER — IBUPROFEN 600 MG PO TABS
600.0000 mg | ORAL_TABLET | Freq: Four times a day (QID) | ORAL | Status: DC
  Administered 2024-06-03 – 2024-06-04 (×4): 600 mg via ORAL
  Filled 2024-06-02 (×4): qty 1

## 2024-06-02 MED ORDER — ACETAMINOPHEN 500 MG PO TABS: 1000.0000 mg | ORAL_TABLET | Freq: Four times a day (QID) | ORAL | Status: DC

## 2024-06-02 MED ORDER — DIPHENHYDRAMINE HCL 50 MG/ML IJ SOLN: 12.5000 mg | INTRAMUSCULAR | Status: DC | PRN

## 2024-06-02 MED ORDER — NALOXONE HCL 0.4 MG/ML IJ SOLN
0.4000 mg | INTRAMUSCULAR | Status: DC | PRN
Start: 2024-06-02 — End: 2024-06-04

## 2024-06-02 MED ORDER — NIFEDIPINE ER OSMOTIC RELEASE 30 MG PO TB24: 60.0000 mg | ORAL_TABLET | Freq: Every day | ORAL | Status: DC

## 2024-06-02 MED ORDER — ACETAMINOPHEN 10 MG/ML IV SOLN
INTRAVENOUS | Status: DC | PRN
  Administered 2024-06-02: 1000 mg via INTRAVENOUS

## 2024-06-02 MED ORDER — SCOPOLAMINE 1 MG/3DAYS TD PT72
MEDICATED_PATCH | TRANSDERMAL | Status: AC
  Filled 2024-06-02: qty 1

## 2024-06-02 MED ORDER — LIDOCAINE-EPINEPHRINE (PF) 2 %-1:200000 IJ SOLN
INTRAMUSCULAR | Status: DC | PRN
  Administered 2024-06-02 (×5): 5 mL via EPIDURAL

## 2024-06-02 MED ORDER — FENTANYL CITRATE (PF) 100 MCG/2ML IJ SOLN: 25.0000 ug | INTRAMUSCULAR | Status: DC | PRN

## 2024-06-02 MED ORDER — OXYTOCIN-SODIUM CHLORIDE 30-0.9 UT/500ML-% IV SOLN
INTRAVENOUS | Status: AC
  Filled 2024-06-02: qty 500

## 2024-06-02 MED ORDER — KETOROLAC TROMETHAMINE 30 MG/ML IJ SOLN
INTRAMUSCULAR | Status: AC
Start: 2024-06-02 — End: 2024-06-02
  Filled 2024-06-02: qty 1

## 2024-06-02 MED ORDER — POVIDONE-IODINE 10 % EX SWAB
2.0000 | Freq: Once | CUTANEOUS | Status: AC
  Administered 2024-06-02: 2 via TOPICAL

## 2024-06-02 MED ORDER — MEPERIDINE HCL 25 MG/ML IJ SOLN: 6.2500 mg | INTRAMUSCULAR | Status: DC | PRN

## 2024-06-02 MED ORDER — SIMETHICONE 80 MG PO CHEW: 80.0000 mg | CHEWABLE_TABLET | ORAL | Status: DC | PRN

## 2024-06-02 MED ORDER — MENTHOL 3 MG MT LOZG
1.0000 | LOZENGE | OROMUCOSAL | Status: DC | PRN
Start: 2024-06-02 — End: 2024-06-04

## 2024-06-02 MED ORDER — ONDANSETRON HCL 4 MG/2ML IJ SOLN: 4.0000 mg | Freq: Three times a day (TID) | INTRAMUSCULAR | Status: DC | PRN

## 2024-06-02 MED ORDER — SENNOSIDES-DOCUSATE SODIUM 8.6-50 MG PO TABS
2.0000 | ORAL_TABLET | ORAL | Status: DC
  Administered 2024-06-02 – 2024-06-04 (×3): 2 via ORAL
  Filled 2024-06-02 (×4): qty 2

## 2024-06-02 MED ORDER — WITCH HAZEL-GLYCERIN EX PADS: 1.0000 | MEDICATED_PAD | CUTANEOUS | Status: DC | PRN

## 2024-06-02 MED ORDER — SIMETHICONE 80 MG PO CHEW
80.0000 mg | CHEWABLE_TABLET | Freq: Three times a day (TID) | ORAL | Status: DC
  Administered 2024-06-02 – 2024-06-04 (×7): 80 mg via ORAL
  Filled 2024-06-02 (×8): qty 1

## 2024-06-02 MED ORDER — MORPHINE SULFATE (PF) 0.5 MG/ML IJ SOLN
INTRAMUSCULAR | Status: DC | PRN
  Administered 2024-06-02: 3 mg via EPIDURAL

## 2024-06-02 MED ORDER — SODIUM CHLORIDE 0.9 % IR SOLN
Status: DC | PRN
  Administered 2024-06-02: 1

## 2024-06-02 MED ORDER — OXYCODONE HCL 5 MG PO TABS: 5.0000 mg | ORAL_TABLET | ORAL | Status: DC | PRN

## 2024-06-02 MED ORDER — KETOROLAC TROMETHAMINE 30 MG/ML IJ SOLN
30.0000 mg | Freq: Four times a day (QID) | INTRAMUSCULAR | Status: AC
  Administered 2024-06-02 – 2024-06-03 (×4): 30 mg via INTRAVENOUS
  Filled 2024-06-02 (×4): qty 1

## 2024-06-02 MED ORDER — FENTANYL CITRATE (PF) 100 MCG/2ML IJ SOLN
INTRAMUSCULAR | Status: AC
  Filled 2024-06-02: qty 2

## 2024-06-02 MED ORDER — CEFAZOLIN SODIUM-DEXTROSE 3-4 GM/150ML-% IV SOLN
3.0000 g | INTRAVENOUS | Status: AC
  Administered 2024-06-02: 3 g via INTRAVENOUS

## 2024-06-02 MED ORDER — LACTATED RINGERS IV SOLN: INTRAVENOUS | Status: DC

## 2024-06-02 MED ORDER — ENOXAPARIN SODIUM 100 MG/ML IJ SOSY
90.0000 mg | PREFILLED_SYRINGE | INTRAMUSCULAR | Status: DC
  Administered 2024-06-03 (×2): 90 mg via SUBCUTANEOUS
  Filled 2024-06-02 (×3): qty 0.9

## 2024-06-02 MED ORDER — SCOPOLAMINE 1 MG/3DAYS TD PT72
1.0000 | MEDICATED_PATCH | Freq: Once | TRANSDERMAL | Status: DC
  Administered 2024-06-02: 1.5 mg via TRANSDERMAL

## 2024-06-02 MED ORDER — STERILE WATER FOR IRRIGATION IR SOLN
Status: DC | PRN
  Administered 2024-06-02: 1000 mL

## 2024-06-02 MED ORDER — DIBUCAINE (PERIANAL) 1 % EX OINT
1.0000 | TOPICAL_OINTMENT | CUTANEOUS | Status: DC | PRN
Start: 2024-06-02 — End: 2024-06-04

## 2024-06-02 MED ORDER — NIFEDIPINE ER OSMOTIC RELEASE 30 MG PO TB24
30.0000 mg | ORAL_TABLET | Freq: Once | ORAL | Status: AC
  Administered 2024-06-02: 30 mg via ORAL
  Filled 2024-06-02: qty 1

## 2024-06-02 MED ORDER — DIPHENHYDRAMINE HCL 25 MG PO CAPS
25.0000 mg | ORAL_CAPSULE | Freq: Four times a day (QID) | ORAL | Status: DC | PRN
  Administered 2024-06-02 – 2024-06-03 (×2): 25 mg via ORAL

## 2024-06-02 MED ORDER — OXYTOCIN-SODIUM CHLORIDE 30-0.9 UT/500ML-% IV SOLN
2.5000 [IU]/h | INTRAVENOUS | Status: AC
  Administered 2024-06-02: 2.5 [IU]/h via INTRAVENOUS
  Filled 2024-06-02: qty 500

## 2024-06-02 MED ORDER — DIPHENHYDRAMINE HCL 25 MG PO CAPS
25.0000 mg | ORAL_CAPSULE | ORAL | Status: DC | PRN
  Filled 2024-06-02 (×2): qty 1

## 2024-06-02 MED ORDER — METFORMIN HCL 500 MG PO TABS
500.0000 mg | ORAL_TABLET | Freq: Every day | ORAL | Status: DC
  Administered 2024-06-03: 500 mg via ORAL
  Filled 2024-06-02: qty 1

## 2024-06-02 MED ORDER — CEFAZOLIN SODIUM-DEXTROSE 3-4 GM/150ML-% IV SOLN
INTRAVENOUS | Status: AC
Start: 2024-06-02 — End: 2024-06-02
  Filled 2024-06-02: qty 150

## 2024-06-02 MED ORDER — PHENYLEPHRINE HCL-NACL 20-0.9 MG/250ML-% IV SOLN
INTRAVENOUS | Status: DC | PRN
  Administered 2024-06-02: 60 ug/min via INTRAVENOUS
  Administered 2024-06-02: 80 ug via INTRAVENOUS
  Administered 2024-06-02 (×2): 40 ug via INTRAVENOUS
  Administered 2024-06-02: 80 ug via INTRAVENOUS

## 2024-06-02 MED ORDER — COCONUT OIL OIL
1.0000 | TOPICAL_OIL | Status: DC | PRN
Start: 2024-06-02 — End: 2024-06-04

## 2024-06-02 MED ORDER — LACTATED RINGERS IV SOLN: INTRAVENOUS | Status: DC | PRN

## 2024-06-02 MED ORDER — PRENATAL MULTIVITAMIN CH
1.0000 | ORAL_TABLET | Freq: Every day | ORAL | Status: DC
  Administered 2024-06-02 – 2024-06-04 (×3): 1 via ORAL
  Filled 2024-06-02 (×4): qty 1

## 2024-06-02 MED ORDER — FENTANYL CITRATE (PF) 100 MCG/2ML IJ SOLN
INTRAMUSCULAR | Status: DC | PRN
  Administered 2024-06-02: 100 ug via EPIDURAL

## 2024-06-02 SURGICAL SUPPLY — 35 items
BENZOIN TINCTURE PRP APPL 2/3 (GAUZE/BANDAGES/DRESSINGS) ×2 IMPLANT
CHLORAPREP W/TINT 26 (MISCELLANEOUS) ×4 IMPLANT
CLAMP UMBILICAL CORD (MISCELLANEOUS) ×2 IMPLANT
CLOTH BEACON ORANGE TIMEOUT ST (SAFETY) ×2 IMPLANT
DERMABOND ADVANCED .7 DNX12 (GAUZE/BANDAGES/DRESSINGS) IMPLANT
DRESSING PREVENA PLUS CUSTOM (GAUZE/BANDAGES/DRESSINGS) IMPLANT
DRSG OPSITE POSTOP 4X10 (GAUZE/BANDAGES/DRESSINGS) ×2 IMPLANT
ELECTRODE REM PT RTRN 9FT ADLT (ELECTROSURGICAL) ×2 IMPLANT
EXTENDER TRAXI PANNICULUS (MISCELLANEOUS) IMPLANT
EXTRACTOR VACUUM KIWI (MISCELLANEOUS) IMPLANT
GLOVE BIOGEL M STER SZ 6 (GLOVE) ×2 IMPLANT
GLOVE BIOGEL PI IND STRL 6.5 (GLOVE) ×2 IMPLANT
GLOVE BIOGEL PI IND STRL 7.0 (GLOVE) ×2 IMPLANT
GOWN STRL REUS W/TWL LRG LVL3 (GOWN DISPOSABLE) ×4 IMPLANT
KIT ABG SYR 3ML LUER SLIP (SYRINGE) IMPLANT
NDL HYPO 25X5/8 SAFETYGLIDE (NEEDLE) IMPLANT
NEEDLE HYPO 25X5/8 SAFETYGLIDE (NEEDLE) IMPLANT
NS IRRIG 1000ML POUR BTL (IV SOLUTION) ×2 IMPLANT
PACK C SECTION WH (CUSTOM PROCEDURE TRAY) ×2 IMPLANT
PAD OB MATERNITY 4.3X12.25 (PERSONAL CARE ITEMS) ×2 IMPLANT
RETAINER VISCERAL (MISCELLANEOUS) IMPLANT
RETRACTOR TRAXI PANNICULUS (MISCELLANEOUS) IMPLANT
RTRCTR C-SECT PINK 25CM LRG (MISCELLANEOUS) ×2 IMPLANT
STRIP CLOSURE SKIN 1/2X4 (GAUZE/BANDAGES/DRESSINGS) ×2 IMPLANT
SUT MNCRL 0 VIOLET CTX 36 (SUTURE) ×4 IMPLANT
SUT PDS AB 0 CTX 60 (SUTURE) IMPLANT
SUT PLAIN 0 NONE (SUTURE) IMPLANT
SUT PLAIN ABS 2-0 CT1 27XMFL (SUTURE) ×2 IMPLANT
SUT VIC AB 0 CTX36XBRD ANBCTRL (SUTURE) ×4 IMPLANT
SUT VIC AB 2-0 CT1 TAPERPNT 27 (SUTURE) ×2 IMPLANT
SUT VIC AB 4-0 PS2 27 (SUTURE) ×2 IMPLANT
SUT VICRYL 4-0 PS2 18IN ABS (SUTURE) ×2 IMPLANT
TOWEL OR 17X24 6PK STRL BLUE (TOWEL DISPOSABLE) ×2 IMPLANT
TRAY FOLEY W/BAG SLVR 14FR LF (SET/KITS/TRAYS/PACK) ×2 IMPLANT
WATER STERILE IRR 1000ML POUR (IV SOLUTION) ×2 IMPLANT

## 2024-06-02 NOTE — Anesthesia Preprocedure Evaluation (Signed)
 Anesthesia Evaluation  Patient identified by MRN, date of birth, ID band Patient awake    Reviewed: Allergy  & Precautions, NPO status , Patient's Chart, lab work & pertinent test results  Airway Mallampati: II  TM Distance: >3 FB Neck ROM: Full    Dental   Pulmonary asthma    breath sounds clear to auscultation       Cardiovascular hypertension, Pt. on medications  Rhythm:Regular Rate:Normal     Neuro/Psych Chiari Type I malformation    GI/Hepatic Neg liver ROS,GERD  ,,  Endo/Other  negative endocrine ROS    Renal/GU negative Renal ROS     Musculoskeletal   Abdominal   Peds  Hematology  (+) Blood dyscrasia, anemia   Anesthesia Other Findings   Reproductive/Obstetrics (+) Pregnancy                              Lab Results  Component Value Date   WBC 9.3 05/31/2024   HGB 11.1 (L) 05/31/2024   HCT 35.5 (L) 05/31/2024   MCV 86.6 05/31/2024   PLT 245 05/31/2024    Anesthesia Physical Anesthesia Plan  ASA: 3  Anesthesia Plan: Epidural   Post-op Pain Management:    Induction:   PONV Risk Score and Plan: 2 and Dexamethasone and Ondansetron   Airway Management Planned: Natural Airway  Additional Equipment:   Intra-op Plan:   Post-operative Plan:   Informed Consent: I have reviewed the patients History and Physical, chart, labs and discussed the procedure including the risks, benefits and alternatives for the proposed anesthesia with the patient or authorized representative who has indicated his/her understanding and acceptance.       Plan Discussed with:   Anesthesia Plan Comments:          Anesthesia Quick Evaluation

## 2024-06-02 NOTE — Transfer of Care (Signed)
 Immediate Anesthesia Transfer of Care Note  Patient: Ashley Acevedo  Procedure(s) Performed: CESAREAN DELIVERY  Patient Location: PACU  Anesthesia Type:Epidural  Level of Consciousness: awake, alert , and oriented  Airway & Oxygen Therapy: Patient Spontanous Breathing  Post-op Assessment: Report given to RN and Post -op Vital signs reviewed and stable  Post vital signs: Reviewed and stable  Last Vitals:  Vitals Value Taken Time  BP 131/63 06/02/24 09:38  Temp    Pulse 89 06/02/24 09:41  Resp 18 06/02/24 09:41  SpO2 98 % 06/02/24 09:41  Vitals shown include unfiled device data.  Last Pain:  Vitals:   06/02/24 0552  TempSrc:   PainSc: 0-No pain         Complications: No notable events documented.

## 2024-06-02 NOTE — Lactation Note (Signed)
 This note was copied from a baby's chart. Lactation Consultation Note  Patient Name: Boy Zamari Cory Today's Date: 06/02/2024 Age:30 hours Reason for consult: Follow-up assessment;Primapara;Early term 46-38.6wks New mom needed latch assistance. Placed baby in football hold for latching. Suggested mom use this position it is easier for her. Taught mom hand placement, pillows for support and how to latch. Baby finally latched well w/occasional swallow. Newborn feeding habits, behavior, STS, I&O reviewed. Wake baby every 3 hrs to feed if hasn't cued before then.  Maternal Data Has patient been taught Hand Expression?: Yes Does the patient have breastfeeding experience prior to this delivery?: No  Feeding    LATCH Score Latch: Grasps breast easily, tongue down, lips flanged, rhythmical sucking.  Audible Swallowing: A few with stimulation  Type of Nipple: Everted at rest and after stimulation  Comfort (Breast/Nipple): Soft / non-tender  Hold (Positioning): Full assist, staff holds infant at breast  LATCH Score: 7   Lactation Tools Discussed/Used    Interventions Interventions: Breast feeding basics reviewed;Assisted with latch;Skin to skin;Hand express;Support pillows;Position options;Education  Discharge    Consult Status Consult Status: Follow-up Date: 06/03/24 Follow-up type: In-patient    Willye Javier G 06/02/2024, 8:59 PM

## 2024-06-02 NOTE — H&P (Signed)
 28 y.o. G1P0000 @ [redacted]w[redacted]d presents for primary cesarean section.  Otherwise has good fetal movement and no bleeding.  Pregnancy complicated by: Chronic hypertension: diagnosed this pregnancy with multiple elevated blood pressures prior to 20 weeks.  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  While attempt at SVD is acceptable, she should avoid a second stage > 1 hour.  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  3rd attempt was low risk Anxiety: on escitalopram  Iron deficiency anemia: s/p IV iron x 2  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/2020   Allergies    Anemia    Anxiety    Asthma    B12 nutritional deficiency 01/23/2020   Back pain    Chiari malformation type I (HCC)    Depression 01/23/2020   Fatigue 12/20/2020   GERD (gastroesophageal reflux disease)    Heartburn    History of stomach ulcers    Hyperlipidemia    Joint pain    Mild persistent asthma without complication 11/02/2017   Mixed hyperlipidemia 06/12/2020   Nausea in adult 11/27/2021   Pollen-food allergy  06/21/2021   Pregnancy induced hypertension    Seasonal allergic conjunctivitis 11/02/2017   Seasonal and perennial allergic rhinitis 11/02/2017   Vitamin B12 deficiency 03/11/2022   Vitamin D  deficiency 01/23/2020    Past Surgical History:  Procedure Laterality Date   WISDOM TOOTH EXTRACTION      OB History  Gravida Para Term Preterm AB Living  1 0 0 0 0 0  SAB IAB Ectopic Multiple Live Births  0 0 0 0 0    # Outcome Date GA Lbr Len/2nd Weight Sex Type Anes PTL Lv  1 Current             Social History   Socioeconomic History   Marital status: Married    Spouse name: Not on file   Number of children: Not on file   Years of  education: Not on file   Highest education level: Not on file  Occupational History   Not on file  Tobacco Use   Smoking status: Never   Smokeless tobacco: Never  Vaping Use   Vaping status: Never Used  Substance and Sexual Activity   Alcohol use: Not Currently    Comment: socially   Drug use: No   Sexual activity: Yes  Other Topics Concern   Not on file  Social History Narrative   Biology degree --> wants to do research   Works at a [employer]    No children or pets   Social Drivers of Corporate investment banker Strain: Not on file  Food Insecurity: No Food Insecurity (06/02/2024)   Hunger Vital Sign    Worried About Running Out of Food in the Last Year: Never true    Ran Out of Food in the Last Year: Never true  Transportation Needs: No Transportation Needs (06/02/2024)   PRAPARE - Administrator, Civil Service (Medical): No    Lack of Transportation (Non-Medical): No  Physical Activity: Not on file  Stress: Not on file  Social Connections: Unknown (02/21/2022)   Received from [facility]   Social Network    Social Network: Not on file  Intimate Partner  Violence: Unknown (01/13/2022)   Received from [facility]   HITS    Physically Hurt: Not on file    Insult or Talk Down To: Not on file    Threaten Physical Harm: Not on file    Scream or Curse: Not on file   Clindamycin, Codeine, and Hydrocodone    Prenatal Transfer Tool  Maternal Diabetes: No Genetic Screening: Normal Maternal Ultrasounds/Referrals: Normal Fetal Ultrasounds or other Referrals:  Referred to Materal Fetal Medicine  Maternal Substance Abuse:  No Significant Maternal Medications:  Meds include: Other:  metformin , procardia , aspirin, escitalopram  Significant Maternal Lab Results: Group B Strep negative  ABO, Rh: --/--/PENDING (08/21 0600) Antibody: NEG (08/19 0910) Rubella: Immune (02/25 0000) RPR: NON REACTIVE (08/19 0930)  HBsAg: Negative (02/25  0000)  HIV: Non-Reactive (02/25 0000)  GBS: Negative/-- (08/07 0000)       Vitals:   06/02/24 0550 06/02/24 0617  BP: (!) 181/95 134/86  Pulse: (!) 124   Resp: 20   Temp: 98.5 F (36.9 C)   SpO2: 97%      General:  NAD Abdomen:  soft, gravid Ex: +SCDs FHTs:  137    A/P   28 y.o. G1P0000 108w3d presents for primary cesarean section in the setting of Chiari malformation.    Discussed risks of cesarean section to include, but not limited to, infection, bleeding, damage to surrounding strutcures (including bowel, bladder, tubes, ovaries, nerves, vessels, baby), need for additional procedures, risk of blood clot, need for transfusion. Consent signed  Ancef  3 gm on call to OR  [facility] GEFFEL GRETTA

## 2024-06-02 NOTE — Lactation Note (Signed)
 This note was copied from a baby's chart. Lactation Consultation Note  Patient Name: Ashley Acevedo Today's Date: 06/02/2024 Age:30 hours, P1 ,  Reason for consult: Initial assessment;Primapara;1st time breastfeeding;Early term 37-38.6wks As LC entered the room the baby was latched shallow, and then released. Nipple well rounded when the baby released.  LC reviewed Breast feeding goals for 24 hours - feed with cues and by 3 hours offer the breast STS , ( 8-12 times in 24 hours)  LC entered the room at the end of the feeding so mom is aware she call with feeding cues for assistance. Mom is aware the nurse or LC can assist to latch.  Maternal Data Has patient been taught Hand Expression?: Yes (after the baby released from the breast and noted several drops) Does the patient have breastfeeding experience prior to this delivery?: No  Feeding Mother's Current Feeding Choice: Breast Milk  LATCH Score Latch:  (latched shallow and it was at the end of the feeding)     Type of Nipple:  (well rounded when baby released)  Comfort (Breast/Nipple):  (per mom comfortable with the latch)  Hold (Positioning):  (nurse assisted)       Interventions  Breast feeding basics, 24 hour goals, LC resources and storage of breast milk.   Discharge Pump: Personal;DEBP;Manual (per mom Spectra ) WIC Program: No  Consult Status Consult Status: Follow-up Date: 06/03/24 Follow-up type: In-patient    [name] 06/02/2024, 1:53 PM

## 2024-06-02 NOTE — Progress Notes (Signed)
 Pt asymptomatic with cHTN. BP slightly elevated with orthostatics, but within normal range per pt. Took 60 mg of Procardia  at home (~0500) and took a hospital dose of 30 mg at 1433 (see MAR) for typical daily total of 90mg . Next BP check with 2200 assessment.   Atwood Adcock, RN 06/02/24

## 2024-06-02 NOTE — Anesthesia Procedure Notes (Addendum)
 Epidural Patient location during procedure: OR Start time: 06/02/2024 7:35 AM End time: 06/02/2024 7:44 AM  Staffing Anesthesiologist: Epifanio Charleston, MD Performed: anesthesiologist   Preanesthetic Checklist Completed: patient identified, IV checked, site marked, risks and benefits discussed, surgical consent, monitors and equipment checked, pre-op evaluation and timeout performed  Epidural Patient position: sitting Prep: DuraPrep and site prepped and draped Patient monitoring: continuous pulse ox and blood pressure Approach: midline Location: L4-L5 Injection technique: LOR air  Needle:  Needle type: Tuohy  Needle gauge: 17 G Needle length: 9 cm and 9 Needle insertion depth: 9 cm Catheter type: closed end flexible Catheter size: 19 Gauge Catheter at skin depth: 16 cm Test dose: negative  Assessment Events: blood not aspirated, no cerebrospinal fluid, injection not painful, no injection resistance, no paresthesia and negative IV test

## 2024-06-02 NOTE — Op Note (Signed)
 Cesarean Section Procedure Note  Pre-operative Diagnosis: 1. Intrauterine pregnancy at [redacted]w[redacted]d  2. Chronic hypertension 3. Chiari malformation  Post-operative Diagnosis: same as above  Surgeon: Jolene Gaskins, MD  Assistants: Marjorie Gull, MD  Procedure: Primary low transverse cesarean section   Anesthesia: Epidural anesthesia  Estimated Blood Loss: 400 mL         Drains: Foley catheter         Specimens: placenta to L&D               Complications:  None; patient tolerated the procedure well.         Disposition: PACU - hemodynamically stable.  Findings:  Normal uterus, tubes and ovaries bilaterally.  Viable female infant, 3500 g (7 lb 11.5 oz) Apgars 7, 9.    Procedure Details   After epidural  anesthesia was found to adequate, the patient was placed in the dorsal supine position with a leftward tilt.  A traxi panniculus retractor was placed. She was then prepped and draped in the usual sterile manner. A Pfannenstiel incision was made and carried down through the subcutaneous tissue to the fascia.  The fascia was incised in the midline and the fascial incision was extended laterally with Mayo scissors. The superior aspect of the fascial incision was grasped with two Kocher clamps, tented up and the rectus muscles dissected off sharply. The rectus was then dissected off with blunt dissection and Mayo scissors inferiorly. The rectus muscles were separated in the midline. The abdominal peritoneum was identified, tented up, entered bluntly, and the incision was extended superiorly and inferiorly with good visualization of the bladder. The Alexis retractor was deployed. The vesicouterine peritoneum was identified.  The bladder was well below the lower uterine segment. A scalpel was then used to make a low transverse incision on the uterus which was extended in the cephalad-caudad direction with blunt dissection. The fluid was clear. The fetal vertex was identified, elevated out of the pelvis and  brought to the hysterotomy.  The head was floating and a Kiwi vacuum required to effect delivery.  The vacuum was applied and activated to the appropriate pressure.  With one pull, the head was delivered.  The vacuum was disengaged and the shoulders and body delivered.  After a 60 second delay per protocol, the cord was clamped and cut and the infant was passed to the waiting neonatologist.  The placenta was then delivered spontaneously, intact and appear normal, the uterus was cleared of all clot and debris   The hysterotomy was repaired with #0 Monocryl in running locked fashion.  A second imbricating layer of #0 Monocryl was placed.  A figure of eight suture was placed at the midportion of the hysterotomy, and excellent hemostasis was noted.  The Alexis retractor was removed from the abdomen. The peritoneum was examined and all vessels noted to be hemostatic. The abdominal cavity was cleared of all clot and debris.  The peritoneum was closed with 2-0 vicryl in a running fashion.  The fascia and rectus muscles were inspected and were hemostatic. The fascia was closed with 0 Vicryl in a running fashion. The subcutaneous layer was irrigated and all bleeders cauterized. The subcutaneous layer was closed with interrupted plain gut in two layers. The skin was closed with 4-0 monocryl in a subcuticular fashion. The incision was dressed with a prevena negative pressure wound dressing. All sponge lap and needle counts were correct x3. Patient tolerated the procedure well and recovered in stable condition following the procedure.

## 2024-06-03 LAB — CBC
HCT: 31.3 % — ABNORMAL LOW (ref 36.0–46.0)
Hemoglobin: 10 g/dL — ABNORMAL LOW (ref 12.0–15.0)
MCH: 27.5 pg (ref 26.0–34.0)
MCHC: 31.9 g/dL (ref 30.0–36.0)
MCV: 86 fL (ref 80.0–100.0)
Platelets: 198 K/uL (ref 150–400)
RBC: 3.64 MIL/uL — ABNORMAL LOW (ref 3.87–5.11)
RDW: 18.1 % — ABNORMAL HIGH (ref 11.5–15.5)
WBC: 9.3 K/uL (ref 4.0–10.5)
nRBC: 0 % (ref 0.0–0.2)

## 2024-06-03 LAB — BIRTH TISSUE RECOVERY COLLECTION (PLACENTA DONATION)

## 2024-06-03 NOTE — Progress Notes (Signed)
 MOB was referred for history of depression/anxiety. * Referral screened out by Clinical Social Worker because none of the following criteria appear to apply: ~ History of anxiety/depression during this pregnancy, or of post-partum depression following prior delivery. ~ Diagnosis of anxiety and/or depression within last 3 years OR * MOB's symptoms currently being treated with medication and/or therapy. Per chart review, MOB is prescribed/taking escitalopram .   Please contact the Clinical Social Worker if needs arise, by [facility] request, or if MOB scores greater than 9/yes to question 10 on Edinburgh Postpartum Depression Screen.  Suzen Law, LCSW Clinical Social Worker [facility] Cell#: [phone]

## 2024-06-03 NOTE — Lactation Note (Signed)
 This note was copied from a baby's chart. Lactation Consultation Note  Patient Name: Ashley Acevedo Unijb'd Date: 06/03/2024 Age:30 hours Reason for consult: Follow-up assessment;Mother's request;Primapara;1st time breastfeeding;Early term 37-38.6wks;Breastfeeding assistance  P1- RN informed LC that MOB was concerned over infant being sleepy and not wanting to eat today. Infant was due for a feeding, so LC encouraged trying at this time with LC's assistance. MOB was very receptive. LC first assessed infant's mouth and noted a thin lingual frenulum that attached very close to the tip of the tongue. LC provided MOB with the physician resource list. LC placed infant on the left breast in the football hold. Infant was active and latched immediately with no assistance from [facility] or MOB. LC demonstrated how to flange infant's bottom lip. MOB denied having any pain or pinching. LC watched infant nurse for 15 minutes and he was still nursing when [facility] left room. Infant needed some stimulation to sustain sucking rhythm. LC encouraged MOB to call for further assistance as needed.  Maternal Data Has patient been taught Hand Expression?: Yes Does the patient have breastfeeding experience prior to this delivery?: No  Feeding Mother's Current Feeding Choice: Breast Milk  LATCH Score Latch: Repeated attempts needed to sustain latch, nipple held in mouth throughout feeding, stimulation needed to elicit sucking reflex.  Audible Swallowing: A few with stimulation  Type of Nipple: Everted at rest and after stimulation  Comfort (Breast/Nipple): Soft / non-tender  Hold (Positioning): Assistance needed to correctly position infant at breast and maintain latch.  LATCH Score: 7   Lactation Tools Discussed/Used Pump Education: Milk Storage  Interventions Interventions: Breast feeding basics reviewed;Assisted with latch;Breast compression;Adjust position;Support pillows;Position options;Education;LC Services  brochure  Discharge Discharge Education: Engorgement and breast care;Warning signs for feeding baby Pump: DEBP;Personal  Consult Status Consult Status: Follow-up Date: 06/04/24 Follow-up type: In-patient    Recardo Hoit BS, IBCLC 06/03/2024, 11:17 PM

## 2024-06-03 NOTE — Progress Notes (Signed)
 Subjective: Postpartum Day 1: Cesarean Delivery Sitting in recliner this morning. Voided x 1 after catheter removal. +flatus. Pain controlled. Tolerating PO. Breastfeeding. +pruritus  Objective: Vital signs in last 24 hours: Temp:  [97.9 F (36.6 C)-99.3 F (37.4 C)] 98.5 F (36.9 C) (08/22 0621) Pulse Rate:  [80-104] 88 (08/22 0621) Resp:  [16-20] 16 (08/22 0621) BP: (111-151)/(63-90) 120/66 (08/22 0621) SpO2:  [96 %-100 %] 98 % (08/22 9378)  Physical Exam:  General: alert, cooperative, and morbidly obese Lochia: appropriate Uterine Fundus: firm Incision: Prevena in place, canister and tubing dry DVT Evaluation: 1+ edema bilaterally, non-tender  Recent Labs    05/31/24 0930 06/03/24 0543  HGB 11.1* 10.0*  HCT 35.5* 31.3*    Assessment/Plan: 28 yo G1 now P1001 POD#1 s/p scheduled PCS - PP: Routine PP care - cHTN: Previously on Procardia  60 XL daily. Received extra 30 mg yesterday when BP elevated. 90 mg XL ordered for this AM - Anxiety: continue escitalopram   - DVT ppx: lovenox  - Dispo: anticipate DC home in 1-2 days  Larraine DELENA Sharps, DO 06/03/2024, 8:25 AM

## 2024-06-03 NOTE — Anesthesia Postprocedure Evaluation (Signed)
 Anesthesia Post Note  Patient: Ashley Acevedo  Procedure(s) Performed: CESAREAN DELIVERY     Patient location during evaluation: PACU Anesthesia Type: Epidural Level of consciousness: awake and alert Pain management: pain level controlled Vital Signs Assessment: post-procedure vital signs reviewed and stable Respiratory status: spontaneous breathing and respiratory function stable Cardiovascular status: blood pressure returned to baseline and stable Postop Assessment: epidural receding Anesthetic complications: no   No notable events documented.  Last Vitals:  Vitals:   06/03/24 0200 06/03/24 0621  BP: 133/69 120/66  Pulse: (!) 104 88  Resp: 18 16  Temp: 37.1 C 36.9 C  SpO2: 99% 98%    Last Pain:  Vitals:   06/03/24 0600  TempSrc:   PainSc: 0-No pain                 Epifanio Lamar BRAVO

## 2024-06-04 MED ORDER — NIFEDIPINE ER OSMOTIC RELEASE 90 MG PO TB24: 90.0000 mg | ORAL_TABLET | Freq: Every day | ORAL | 1 refills | Status: DC

## 2024-06-04 MED ORDER — IBUPROFEN 600 MG PO TABS: 600.0000 mg | ORAL_TABLET | Freq: Four times a day (QID) | ORAL | 0 refills | Status: DC

## 2024-06-04 MED ORDER — OXYCODONE HCL 5 MG PO TABS: 5.0000 mg | ORAL_TABLET | Freq: Four times a day (QID) | ORAL | 0 refills | Status: DC | PRN

## 2024-06-04 NOTE — Discharge Summary (Signed)
 Postpartum Discharge Summary  Date of Service updated 06/04/24     Patient Name: Ashley Acevedo DOB: 04/14/1996 MRN: [id number]  Date of admission: 06/02/2024 Delivery date:06/02/2024 Delivering provider: GRETTA GUMS Date of discharge: 06/04/2024  Admitting diagnosis: Benign essential hypertension antepartum in third trimester [O10.013] Intrauterine pregnancy: [redacted]w[redacted]d     Secondary diagnosis:  Principal Problem:   Benign essential hypertension antepartum in third trimester  Additional problems: Chiari I malformation    Discharge diagnosis: Term Pregnancy Delivered and CHTN                                              Post partum procedures:n/a Augmentation: N/A Complications: None  Hospital course: Sceduled C/S   28 y.o. yo G1P1001 at [redacted]w[redacted]d was admitted to the hospital 06/02/2024 for scheduled cesarean section with the following indication:Chiari I malformation.Delivery details are as follows:  Membrane Rupture Time/Date:  ,   Delivery Method:C-Section, Vacuum Assisted  Details of operation can be found in separate operative note.  Patient had a postpartum course complicated by [facility] requiring titration of home nifedipine  XL to 90 mg with excellent control on day of discharge.  She is ambulating, tolerating a regular diet, passing flatus, and urinating well. Patient is discharged home in stable condition on  06/04/24        Newborn Data: Birth date:06/02/2024 Birth time:8:29 AM Gender:Female Living status:Living Apgars:7 ,9  Weight:3500 g    Magnesium Sulfate received: No BMZ received: No Rhophylac:N/A MMR:N/A T-DaP:Given prenatally Flu: Yes RSV Vaccine received: No Transfusion:No Immunizations administered: Immunization History  Administered Date(s) Administered   DTaP 07/21/1994, 09/15/1994, 11/13/1994, 11/19/1995, 05/14/1998   HIB (PRP-T) 07/21/1994, 09/15/1994, 11/13/1994, 11/19/1995   HPV Quadrivalent 06/25/2007, 08/30/2007, 01/26/2008   Hepatitis A 06/25/2007,  04/12/2009   Hepatitis B 10/08/94, 07/21/1994, 11/13/1994   IPV 07/21/1994, 09/15/1994, 11/13/1994, 05/14/1998   Influenza-Unspecified 07/05/2013, 07/31/2015, 07/26/2016, 07/25/2019, 07/14/2023   MMR 05/19/1995, 02/04/1999   Meningococcal Conjugate 06/25/2007, 08/01/2011   PPD Test 02/17/2013   Td 05/01/2005   Tdap 04/10/2015, 03/30/2024   Varicella 05/13/2004    Physical exam  Vitals:   06/03/24 0621 06/03/24 1358 06/03/24 2059 06/04/24 0525  BP: 120/66 135/64 133/87 125/73  Pulse: 88 98 (!) 103 86  Resp: 16  18 18   Temp: 98.5 F (36.9 C) 98.2 F (36.8 C) 98.4 F (36.9 C) 97.9 F (36.6 C)  TempSrc:  Oral Oral   SpO2: 98% 98% 99% 98%  Weight:      Height:       General: alert, cooperative, and no distress Lochia: appropriate Uterine Fundus: firm Incision: Dressing is clean, dry, and intact DVT Evaluation: No evidence of DVT seen on physical exam. Labs: Lab Results  Component Value Date   WBC 9.3 06/03/2024   HGB 10.0 (L) 06/03/2024   HCT 31.3 (L) 06/03/2024   MCV 86.0 06/03/2024   PLT 198 06/03/2024      Latest Ref Rng & Units 05/31/2024    9:30 AM  CMP  Glucose 70 - 99 mg/dL 81   BUN 6 - 20 mg/dL 5   Creatinine 9.55 - 8.99 mg/dL 9.50   Sodium 864 - 854 mmol/L 135   Potassium 3.5 - 5.1 mmol/L 3.8   Chloride 98 - 111 mmol/L 101   CO2 22 - 32 mmol/L 22   Calcium 8.9 - 10.3 mg/dL 8.7  Total Protein 6.5 - 8.1 g/dL 6.3   Total Bilirubin 0.0 - 1.2 mg/dL 0.6   Alkaline Phos 38 - 126 U/L 69   AST 15 - 41 U/L 12   ALT 0 - 44 U/L 11    Edinburgh Score:    06/03/2024    8:23 PM  Edinburgh Postnatal Depression Scale Screening Tool  I have been able to laugh and see the funny side of things. 0  I have looked forward with enjoyment to things. 0  I have blamed myself unnecessarily when things went wrong. 1  I have been anxious or worried for no good reason. 2  I have felt scared or panicky for no good reason. 2  Things have been getting on top of me. 0  I have  been so unhappy that I have had difficulty sleeping. 0  I have felt sad or miserable. 0  I have been so unhappy that I have been crying. 0  The thought of harming myself has occurred to me. 0  Edinburgh Postnatal Depression Scale Total 5      After visit meds:  Allergies as of 06/04/2024       Reactions   Clindamycin Rash   Codeine Rash   Hydrocodone Rash        Medication List     STOP taking these medications    ferrous sulfate 325 (65 FE) MG EC tablet   Personal Best Full Range Devi Generic drug: Peak Flow Meter   UNABLE TO FIND       TAKE these medications    albuterol  108 (90 Base) MCG/ACT inhaler Commonly known as: Ventolin  HFA Inhale 2 puffs into the lungs every 4 (four) hours as needed for wheezing or shortness of breath.   cetirizine 10 MG tablet Commonly known as: ZYRTEC Take 10 mg by mouth daily.   escitalopram  10 MG tablet Commonly known as: LEXAPRO  Take 1 tablet (10 mg total) by mouth daily.   fluticasone 50 MCG/ACT nasal spray Commonly known as: FLONASE Place 1 spray into both nostrils daily as needed for allergies.   ibuprofen  600 MG tablet Commonly known as: ADVIL  Take 1 tablet (600 mg total) by mouth every 6 (six) hours.   metFORMIN  500 MG tablet Commonly known as: GLUCOPHAGE  1 tab daily with meal What changed:  how much to take how to take this when to take this additional instructions   NIFEdipine  90 MG 24 hr tablet Commonly known as: PROCARDIA  XL/NIFEDICAL-XL Take 1 tablet (90 mg total) by mouth daily. What changed:  medication strength how much to take   oxyCODONE  5 MG immediate release tablet Commonly known as: Oxy IR/ROXICODONE  Take 1 tablet (5 mg total) by mouth every 6 (six) hours as needed for severe pain (pain score 7-10).   pantoprazole  40 MG tablet Commonly known as: PROTONIX  TAKE 1 TABLET(40 MG) BY MOUTH DAILY   prenatal vitamin w/FE, FA 27-1 MG Tabs tablet Take 1 tablet by mouth daily at 12 noon.                Discharge Care Instructions  (From admission, onward)           Start     Ordered   06/04/24 0000  Leave dressing on - Keep it clean, dry, and intact until clinic visit        06/04/24 0801             Discharge home in stable condition Infant Feeding: Breast Infant Disposition:home  with mother Discharge instruction: per After Visit Summary and Postpartum booklet. Activity: Advance as tolerated. Pelvic rest for 6 weeks.  Diet: routine diet Anticipated Birth Control: Unsure Postpartum Appointment:4 weeks Additional Postpartum F/U: Incision check 1 week Future Appointments: Future Appointments  Date Time Provider Department Center  06/27/2024 11:00 AM Danford, Rockie BIRCH, NP [facility] None   Follow up Visit:  Follow-up Information     Gretta Gums, MD Follow up on 06/08/2024.   Specialty: Obstetrics Why: 5-7 days wound check and BP check Contact information: [address] [place] [address] [phone]                     06/04/2024 [facility] VELNA GRETTA, MD

## 2024-06-04 NOTE — Lactation Note (Signed)
 This note was copied from a baby's chart. Lactation Consultation Note  Patient Name: Ashley Acevedo Today's Date: 06/04/2024 Age:30 hours, P1  Reason for consult: Follow-up assessment;Infant weight loss;Primapara;1st time breastfeeding;Early term 37-38.6wks;Breastfeeding assistance (8 % weight loss,) Baby was latched when [facility] entered the room on the left breast football, LC adjusted the baby for increased depth and more swallows noted.  Parents mentioned to this LC that the previous LC noted a tongue tie under the tongue to the tip.  After the baby released this LC reassessed and agreed with the previous LC of the findings.  Borderline tight to short. Baby is able to stretch over the gum line a short distance , midline indentation noted. The tongue restriction isn't interfering with obtaining a deep latch.  LC mentioned to mom when her milk comes in she will probably have to release enough milk off so the areola is compressible and LC instructed mom on how to use the reverse pressure to elongate the nipple  Areola complex.  LC reviewed engorgement prevention and tx.  LC recommended when she goes for her 1st Peds appt to have the NP or the Peds check the tongue mobility.  LC reviewed breast feeding D/C teaching and the [facility] resources.  Maternal Data Has patient been taught Hand Expression?: Yes Does the patient have breastfeeding experience prior to this delivery?: No  Feeding Mother's Current Feeding Choice: Breast Milk  LATCH Score Latch:  (latched with depth and the change of position improved the depth , more swallows noted)  Audible Swallowing:  (swallows increased by adjusting the position)  Type of Nipple:  (nipple slightly creased when the baby released)  Comfort (Breast/Nipple):  (milk is coming in)         Lactation Tools Discussed/Used Tools: Pump Breast pump type: Manual Pump Education: Setup, frequency, and cleaning;Milk Storage Reason for Pumping:  PRN  Interventions Interventions: Breast feeding basics reviewed;Breast compression;Hand pump;Education;LC Services brochure;CDC Guidelines for Breast Pump Cleaning;CDC milk storage guidelines  Discharge Discharge Education: Engorgement and breast care;Warning signs for feeding baby;Outpatient recommendation;Other (comment) (per mom she thinks her Peds office has a LC) Pump: Manual;Personal;DEBP WIC Program: No  Consult Status Consult Status: Complete Date: 06/04/24    Rollene Jenkins Fiedler 06/04/2024, 1:03 PM

## 2024-06-04 NOTE — Progress Notes (Signed)
 Patient is doing well.  She is tolerating PO, ambulating, voiding.  Pain is controlled.  Lochia is appropriate  Vitals:   06/03/24 0621 06/03/24 1358 06/03/24 2059 06/04/24 0525  BP: 120/66 135/64 133/87 125/73  Pulse: 88 98 (!) 103 86  Resp: 16  18 18   Temp: 98.5 F (36.9 C) 98.2 F (36.8 C) 98.4 F (36.9 C) 97.9 F (36.6 C)  TempSrc:  Oral Oral   SpO2: 98% 98% 99% 98%  Weight:      Height:        NAD Abdomen:  Fundus firm, Prevena in place ext:    Symmetric, 2+ edema bilaterally  Lab Results  Component Value Date   WBC 9.3 06/03/2024   HGB 10.0 (L) 06/03/2024   HCT 31.3 (L) 06/03/2024   MCV 86.0 06/03/2024   PLT 198 06/03/2024    --/--/O POS Performed at [facility], [address] [place], [place] [address]  (08/21 0600)  A/P    28 y.o. G1P1001 POD #2 s/p primary cesarean section for Chiari malformation and CHTN Routine post op and postpartum care.   CHTN: BPs well controlled on increased nifedipine  90mg  Anxiety: continue escitalopram  DVT ppx: lovenox  Desires discharge today, f/u in office in 5 days

## 2024-06-10 DIAGNOSIS — Z713 Dietary counseling and surveillance: Secondary | ICD-10-CM | POA: Diagnosis not present

## 2024-06-14 ENCOUNTER — Telehealth (HOSPITAL_COMMUNITY): Payer: Self-pay | Admitting: *Deleted

## 2024-06-14 NOTE — Telephone Encounter (Signed)
 06/14/2024  Name: Ashley Acevedo MRN: [id number] DOB: 1996-10-11  Reason for Call:  Transition of Care Hospital Discharge Call  Contact Status: Patient Contact Status: Complete  Language assistant needed: Interpreter Mode: Interpreter Not Needed        Follow-Up Questions: Do You Have Any Concerns About Your Health As You Heal From Delivery?: No Do You Have Any Concerns About Your Infants Health?: No  Edinburgh Postnatal Depression Scale:  In the Past 7 Days: I have been able to laugh and see the funny side of things.: As much as I always could I have looked forward with enjoyment to things.: As much as I ever did I have blamed myself unnecessarily when things went wrong.: Yes, some of the time I have been anxious or worried for no good reason.: Hardly ever I have felt scared or panicky for no good reason.: No, not much Things have been getting on top of me.: No, I have been coping as well as ever I have been so unhappy that I have had difficulty sleeping.: Not at all I have felt sad or miserable.: No, not at all I have been so unhappy that I have been crying.: Only occasionally The thought of harming myself has occurred to me.: Never [place] Postnatal Depression Scale Total: 5  PHQ2-9 Depression Scale:     Discharge Follow-up: Edinburgh score requires follow up?: No Patient was advised of the following resources:: Support Group, Breastfeeding Support Group  Post-discharge interventions: Reviewed Newborn Safe Sleep Practices Patient noted that they always place baby in bassinet on his back, but he usually rolls to one side in his sleep.  Advised that this is normal and to continue placing baby on his back and to return him to his back if they notice he has repositioned himself on his side.   Mliss Sieve, RN 06/14/2024 11:33

## 2024-06-17 DIAGNOSIS — O924 Hypogalactia: Secondary | ICD-10-CM | POA: Diagnosis not present

## 2024-06-17 DIAGNOSIS — Z713 Dietary counseling and surveillance: Secondary | ICD-10-CM | POA: Diagnosis not present

## 2024-06-22 DIAGNOSIS — O924 Hypogalactia: Secondary | ICD-10-CM | POA: Diagnosis not present

## 2024-06-22 DIAGNOSIS — Z713 Dietary counseling and surveillance: Secondary | ICD-10-CM | POA: Diagnosis not present

## 2024-06-27 ENCOUNTER — Encounter (INDEPENDENT_AMBULATORY_CARE_PROVIDER_SITE_OTHER): Payer: Self-pay | Admitting: Adult Health

## 2024-06-27 ENCOUNTER — Ambulatory Visit (INDEPENDENT_AMBULATORY_CARE_PROVIDER_SITE_OTHER): Admitting: Adult Health

## 2024-06-27 VITALS — BP 135/85 | HR 96 | Temp 98.0°F | Ht 68.0 in | Wt 376.0 lb

## 2024-06-27 DIAGNOSIS — E559 Vitamin D deficiency, unspecified: Secondary | ICD-10-CM

## 2024-06-27 DIAGNOSIS — K219 Gastro-esophageal reflux disease without esophagitis: Secondary | ICD-10-CM | POA: Diagnosis not present

## 2024-06-27 DIAGNOSIS — E88819 Insulin resistance, unspecified: Secondary | ICD-10-CM | POA: Diagnosis not present

## 2024-06-27 DIAGNOSIS — E669 Obesity, unspecified: Secondary | ICD-10-CM

## 2024-06-27 DIAGNOSIS — E66813 Obesity, class 3: Secondary | ICD-10-CM

## 2024-06-27 DIAGNOSIS — R030 Elevated blood-pressure reading, without diagnosis of hypertension: Secondary | ICD-10-CM | POA: Diagnosis not present

## 2024-06-27 DIAGNOSIS — Z98891 History of uterine scar from previous surgery: Secondary | ICD-10-CM

## 2024-06-27 DIAGNOSIS — Z6841 Body Mass Index (BMI) 40.0 and over, adult: Secondary | ICD-10-CM

## 2024-06-27 NOTE — Progress Notes (Signed)
 WEIGHT SUMMARY AND BIOMETRICS  Vitals Temp: 98 F (36.7 C) BP: 135/85 Pulse Rate: 96 SpO2: 97 %   Anthropometric Measurements Height: 5' 8 (1.727 m) Weight: (!) 376 lb (170.6 kg) BMI (Calculated): 57.18 Weight at Last Visit: 386lb Weight Lost Since Last Visit: 10lb Weight Gained Since Last Visit: 0 Starting Weight: 295lb Total Weight Loss (lbs): 0 lb (0 kg)   Body Composition  Body Fat %: 61.5 % Fat Mass (lbs): 231.4 lbs Muscle Mass (lbs): 137.8 lbs Visceral Fat Rating : 23   Other Clinical Data Fasting: no Labs: no Today's Visit #: 73 Starting Date: 08/24/19    Chief Complaint:   OBESITY Ashley Acevedo is here to discuss her progress with her obesity treatment plan.  She is on the keeping a food journal and adhering to recommended goals of 1800 calories and 100g protein and states she is following her eating plan approximately 30 % of the time.  She states she is exercising Walking 45 minutes 2 times per week.  Interim History:  She delivered [facility] via cesarean section at [facility] on 06/02/2024. 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 Her husband, Ashley Acevedo, is working remotely and will use his paternity leave mid Nov- Jan 2026.  Ashley Acevedo was started on Procardia  90 mg daily for 3rd trimester HTN- she has remained on.  Her established OB/GYN has taken responsibility for her chronic maintenance medications.  She denies post partum depression and reports an excellent support system- husband and local parents (hers and his).  She is pumping and supplementing Ashley Acevedo with formula recommended by her pediatrician.  She is not producing much milk and followed by a Lactation specialist. She will have labs completed, ie: TSH CBC Prolaction Vit D Iron Ferritin Magnesium  She reports walking at least twice weekly!  Subjective:   1. H/O cesarean section Date of admission: 06/02/2024 Delivery  date:06/02/2024 Delivering provider: [name] Date of discharge: 06/04/2024   Admitting diagnosis: Benign essential hypertension antepartum in third trimester [O10.013] Intrauterine pregnancy: [redacted]w[redacted]d     Secondary diagnosis:  Principal Problem:   Benign essential hypertension antepartum in third trimester   Additional problems: Chiari I malformation                          Discharge diagnosis: Term Pregnancy Delivered and CHTN                                              Post partum procedures:n/a Augmentation: N/A Complications: None   Hospital course: Sceduled C/S   28 y.o. yo G1P1001 at [redacted]w[redacted]d was admitted to the hospital 06/02/2024 for scheduled cesarean section with the following indication:Chiari I malformation.Delivery details are as follows:  Membrane Rupture Time/Date:  ,   Delivery Method:C-Section, Vacuum Assisted   Details of operation can be found in separate operative note.  Patient had a postpartum course complicated by [facility] requiring titration of home nifedipine  XL to 90 mg with excellent control on day of discharge.  She is ambulating, tolerating a regular diet, passing flatus, and urinating well. Patient is discharged home in stable condition on  06/04/24  2. Vitamin D  deficiency She has been off weekly Ergocalciferol  once pregnancy was confirmed.  3. Insulin  resistance Her established OB/GYN has assumed managing daily Metformin   500mg   4. Elevated blood pressure reading Her established OB/GYN has assumed managing daily Procardia  90mg    5. Gastroesophageal reflux disease, unspecified whether esophagitis present Her established OB/GYN has assumed managing daily Protonix  40mg  She feel that her acid reflux is well controlled at present  Assessment/Plan:   1. H/O cesarean section F/u with established OB/GYN Strive for 30 g protein per meal.  2. Vitamin D  deficiency (Primary) F/u with established OB/GYN Strive for 30 g protein per meal.  3. Insulin   resistance F/u with established OB/GYN Strive for 30 g protein per meal. Continue regular walking  4. Elevated blood pressure reading F/u with established OB/GYN Continue regular walking Continue Procardia  90 g as directed  5. Gastroesophageal reflux disease, unspecified whether esophagitis present F/u with established OB/GYN Avoid known trigger foods  6. Obesity, current BMI 57.2  Ashley Acevedo is not currently in the action stage of change. As such, her goal is to continue with weight loss efforts. She has agreed to 30 g protein per meal.  Exercise goals: All adults should avoid inactivity. Some physical activity is better than none, and adults who participate in any amount of physical activity gain some health benefits. Adults should also include muscle-strengthening activities that involve all major muscle groups on 2 or more days a week. Walking several times per week.  Behavioral modification strategies: increasing lean protein intake, decreasing simple carbohydrates, increasing vegetables, increasing water  intake, meal planning and cooking strategies, keeping healthy foods in the home, ways to avoid boredom eating, ways to avoid night time snacking, and planning for success.  Ashley Acevedo has agreed to follow-up with our clinic in 4 weeks. She was informed of the importance of frequent follow-up visits to maximize her success with intensive lifestyle modifications for her multiple health conditions.   Objective:   Blood pressure 135/85, pulse 96, temperature 98 F (36.7 C), height 5' 8 (1.727 m), weight (!) 376 lb (170.6 kg), last menstrual period 09/07/2023, SpO2 97%, unknown if currently breastfeeding. Body mass index is 57.17 kg/m.  General: Cooperative, alert, well developed, in no acute distress. HEENT: Conjunctivae and lids unremarkable. Cardiovascular: Regular rhythm.  Lungs: Normal work of breathing. Neurologic: No focal deficits.   Lab Results  Component Value Date    CREATININE 0.49 05/31/2024   BUN 5 (L) 05/31/2024   NA 135 05/31/2024   K 3.8 05/31/2024   CL 101 05/31/2024   CO2 22 05/31/2024   Lab Results  Component Value Date   ALT 11 05/31/2024   AST 12 (L) 05/31/2024   ALKPHOS 69 05/31/2024   BILITOT 0.6 05/31/2024   Lab Results  Component Value Date   HGBA1C 5.7 11/30/2023   HGBA1C 5.7 (H) 11/25/2023   HGBA1C 6.0 (H) 06/01/2023   HGBA1C 5.7 (H) 11/05/2022   HGBA1C 5.5 03/04/2022   Lab Results  Component Value Date   INSULIN  31.7 (H) 11/25/2023   INSULIN  29.6 (H) 06/01/2023   INSULIN  39.0 (H) 11/05/2022   INSULIN  17.7 03/04/2022   INSULIN  103.0 (H) 08/15/2021   Lab Results  Component Value Date   TSH 2.720 10/01/2022   Lab Results  Component Value Date   CHOL 208 (H) 06/01/2023   HDL 49 06/01/2023   LDLCALC 136 (H) 06/01/2023   TRIG 131 06/01/2023   CHOLHDL 4.2 06/01/2023   Lab Results  Component Value Date   VD25OH 26.5 (L) 11/25/2023   VD25OH 37.5 06/01/2023   VD25OH 17.7 (L) 11/05/2022   Lab Results  Component Value Date   WBC  9.3 06/03/2024   HGB 10.0 (L) 06/03/2024   HCT 31.3 (L) 06/03/2024   MCV 86.0 06/03/2024   PLT 198 06/03/2024   Lab Results  Component Value Date   IRON 76 12/20/2020   TIBC 416 12/20/2020   FERRITIN 23 12/20/2020   Attestation Statements:   Reviewed by clinician on day of visit: allergies, medications, problem list, medical history, surgical history, family history, social history, and previous encounter notes.  I have reviewed the above documentation for accuracy and completeness, and I agree with the above. -  Iara Monds d. Souleymane Saiki, NP-C

## 2024-06-28 DIAGNOSIS — O99019 Anemia complicating pregnancy, unspecified trimester: Secondary | ICD-10-CM | POA: Diagnosis not present

## 2024-06-29 DIAGNOSIS — Z713 Dietary counseling and surveillance: Secondary | ICD-10-CM | POA: Diagnosis not present

## 2024-07-05 DIAGNOSIS — Z1331 Encounter for screening for depression: Secondary | ICD-10-CM | POA: Diagnosis not present

## 2024-07-15 DIAGNOSIS — O924 Hypogalactia: Secondary | ICD-10-CM | POA: Diagnosis not present

## 2024-07-15 DIAGNOSIS — Z713 Dietary counseling and surveillance: Secondary | ICD-10-CM | POA: Diagnosis not present

## 2024-07-18 ENCOUNTER — Ambulatory Visit (INDEPENDENT_AMBULATORY_CARE_PROVIDER_SITE_OTHER): Admitting: Physician Assistant

## 2024-07-18 ENCOUNTER — Encounter: Payer: Self-pay | Admitting: Physician Assistant

## 2024-07-18 VITALS — BP 122/80 | HR 91 | Temp 97.4°F | Ht 68.0 in | Wt 379.5 lb

## 2024-07-18 DIAGNOSIS — G935 Compression of brain: Secondary | ICD-10-CM | POA: Diagnosis not present

## 2024-07-18 DIAGNOSIS — Z23 Encounter for immunization: Secondary | ICD-10-CM | POA: Diagnosis not present

## 2024-07-18 DIAGNOSIS — R030 Elevated blood-pressure reading, without diagnosis of hypertension: Secondary | ICD-10-CM

## 2024-07-18 NOTE — Progress Notes (Signed)
 Ashley Acevedo is a 28 y.o. female here for a follow up of a pre-existing problem.  History of Present Illness:   Chief Complaint  Patient presents with   Hypertension    Pt is here tor f/u on her blood pressure medication. OB told her to f/u with PCP to make sure correct medication.     Discussed the use of AI scribe software for clinical note transcription with the patient, who gave verbal consent to proceed.  History of Present Illness   Ashley Acevedo is a 28 year old female who presents for blood pressure management.  She is experiencing postpartum hypertension and was initially treated with nifedipine  during pregnancy, which caused ankle and foot swelling. Postpartum, she was switched to enalapril, resolving the swelling. She has been on enalapril for about a week and is not currently monitoring her blood pressure at home but attends regular checks.  She had a planned C-section due to a Chiari malformation identified before pregnancy, with no related symptoms such as headaches.  During pregnancy, she experienced anemia requiring iron transfusions, but her iron levels have normalized postpartum. She continues to supplement her milk supply due to low production, and her baby has reflux and colic.  She was on metformin  during pregnancy for slightly elevated fasting glucose but has not resumed it postpartum.  She is taking Lexapro  10 mg, maintained throughout pregnancy and postpartum, with stable mental health and a good support system.  She is not using hormonal contraception and plans to track her menstrual cycle for family planning, with menstruation not yet resumed postpartum.       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/2020   Allergies    Anemia    Anxiety    Asthma    B12 nutritional deficiency 01/23/2020   Back pain    Chiari malformation type I (HCC)    Depression 01/23/2020   Fatigue 12/20/2020   GERD (gastroesophageal reflux disease)     Heartburn    History of stomach ulcers    Hyperlipidemia    Joint pain    Mild persistent asthma without complication 11/02/2017   Mixed hyperlipidemia 06/12/2020   Nausea in adult 11/27/2021   Pollen-food allergy  06/21/2021   Pregnancy induced hypertension    Seasonal allergic conjunctivitis 11/02/2017   Seasonal and perennial allergic rhinitis 11/02/2017   Vitamin B12 deficiency 03/11/2022   Vitamin D  deficiency 01/23/2020     Social History   Tobacco Use   Smoking status: Never   Smokeless tobacco: Never  Vaping Use   Vaping status: Never Used  Substance Use Topics   Alcohol use: Not Currently    Comment: socially   Drug use: No    Past Surgical History:  Procedure Laterality Date   CESAREAN SECTION N/A 06/02/2024   Procedure: CESAREAN DELIVERY;  Surgeon: Gretta Gums, MD;  Location: [facility];  Service: Obstetrics;  Laterality: N/A;   WISDOM TOOTH EXTRACTION      Family History  Problem Relation Age of Onset   Asthma Mother    Obesity Mother    Chiari malformation Mother    Hyperlipidemia Father    Atrial fibrillation Father        had ablation and is now in rhythm   Asthma Maternal Grandmother    Stomach cancer Maternal Grandmother    Chiari malformation Maternal Grandmother    Lung cancer Maternal Grandfather    Prostate cancer Maternal Grandfather    Colon cancer Maternal  Grandfather        late 60's/early 70's   Alzheimer's disease Maternal Grandfather    Brain cancer Paternal Grandmother    Lung cancer Paternal Grandfather    Allergic rhinitis Neg Hx    Angioedema Neg Hx    Eczema Neg Hx    Immunodeficiency Neg Hx    Urticaria Neg Hx     Allergies  Allergen Reactions   Clindamycin Rash   Codeine Rash   Hydrocodone Rash    Current Medications:   Current Outpatient Medications:    albuterol  (VENTOLIN  HFA) 108 (90 Base) MCG/ACT inhaler, Inhale 2 puffs into the lungs every 4 (four) hours as needed for wheezing or shortness of breath., Disp:  18 g, Rfl: 1   cetirizine (ZYRTEC) 10 MG tablet, Take 10 mg by mouth daily., Disp: , Rfl:    enalapril (VASOTEC) 10 MG tablet, Take 10 mg by mouth daily., Disp: , Rfl:    escitalopram  (LEXAPRO ) 10 MG tablet, Take 1 tablet (10 mg total) by mouth daily., Disp: 90 tablet, Rfl: 0   fluticasone (FLONASE) 50 MCG/ACT nasal spray, Place 1 spray into both nostrils daily as needed for allergies., Disp: , Rfl:    metFORMIN  (GLUCOPHAGE -XR) 500 MG 24 hr tablet, Take 500 mg by mouth daily., Disp: , Rfl:    nystatin  (MYCOSTATIN /NYSTOP ) powder, Apply 1 Application topically 2 (two) times daily., Disp: , Rfl:    pantoprazole  (PROTONIX ) 40 MG tablet, TAKE 1 TABLET(40 MG) BY MOUTH DAILY, Disp: 90 tablet, Rfl: 0   prenatal vitamin w/FE, FA (PRENATAL 1 + 1) 27-1 MG TABS tablet, Take 1 tablet by mouth daily at 12 noon., Disp: , Rfl:    Review of Systems:   Negative unless otherwise specified per HPI.  Vitals:   Vitals:   07/18/24 1417  BP: 122/80  Pulse: 91  Temp: (!) 97.4 F (36.3 C)  TempSrc: Temporal  SpO2: 95%  Weight: (!) 379 lb 8 oz (172.1 kg)  Height: 5' 8 (1.727 m)     Body mass index is 57.7 kg/m.  Physical Exam:   Physical Exam Vitals and nursing note reviewed.  Constitutional:      General: She is not in acute distress.    Appearance: She is well-developed. She is not ill-appearing or toxic-appearing.  Cardiovascular:     Rate and Rhythm: Normal rate and regular rhythm.     Pulses: Normal pulses.     Heart sounds: Normal heart sounds, S1 normal and S2 normal.  Pulmonary:     Effort: Pulmonary effort is normal.     Breath sounds: Normal breath sounds.  Skin:    General: Skin is warm and dry.  Neurological:     Mental Status: She is alert.     GCS: GCS eye subscore is 4. GCS verbal subscore is 5. GCS motor subscore is 6.  Psychiatric:        Speech: Speech normal.        Behavior: Behavior normal. Behavior is cooperative.     Assessment and Plan:   Assessment and Plan     Elevated blood pressure reading Postpartum hypertension controlled with enalapril, safe for breastfeeding. - Continue enalapril. - Recommend purchasing a reliable blood pressure cuff for home monitoring. - Ensure professional blood pressure checks at a minimum, every three months during the first postpartum year. She currently sees [facility] every month and will reach out if any concerns with readings. - Discuss potential medication adjustments post-breastfeeding if pregnancy is  considered. Discussed that this is not safe for pregnancy.   Chiari malformation Chiari malformation diagnosed prior to pregnancy, asymptomatic. - Monitor for symptoms such as headaches. - No immediate specialist follow-up unless symptoms develop.  General Health Maintenance Received flu vaccination, safe during breastfeeding.         Lucie Buttner, PA-C

## 2024-07-28 ENCOUNTER — Ambulatory Visit (INDEPENDENT_AMBULATORY_CARE_PROVIDER_SITE_OTHER): Admitting: Adult Health

## 2024-07-29 DIAGNOSIS — Z713 Dietary counseling and surveillance: Secondary | ICD-10-CM | POA: Diagnosis not present

## 2024-08-02 ENCOUNTER — Encounter: Payer: Self-pay | Admitting: Physician Assistant

## 2024-08-02 ENCOUNTER — Ambulatory Visit (INDEPENDENT_AMBULATORY_CARE_PROVIDER_SITE_OTHER): Admitting: Adult Health

## 2024-08-02 ENCOUNTER — Encounter (INDEPENDENT_AMBULATORY_CARE_PROVIDER_SITE_OTHER): Payer: Self-pay | Admitting: Adult Health

## 2024-08-02 VITALS — BP 134/86 | HR 89 | Temp 97.5°F | Ht 68.0 in | Wt 377.0 lb

## 2024-08-02 DIAGNOSIS — E559 Vitamin D deficiency, unspecified: Secondary | ICD-10-CM | POA: Diagnosis not present

## 2024-08-02 DIAGNOSIS — R030 Elevated blood-pressure reading, without diagnosis of hypertension: Secondary | ICD-10-CM | POA: Diagnosis not present

## 2024-08-02 DIAGNOSIS — E669 Obesity, unspecified: Secondary | ICD-10-CM

## 2024-08-02 DIAGNOSIS — E88819 Insulin resistance, unspecified: Secondary | ICD-10-CM | POA: Diagnosis not present

## 2024-08-02 DIAGNOSIS — Z98891 History of uterine scar from previous surgery: Secondary | ICD-10-CM

## 2024-08-02 DIAGNOSIS — Z6841 Body Mass Index (BMI) 40.0 and over, adult: Secondary | ICD-10-CM

## 2024-08-02 NOTE — Telephone Encounter (Signed)
 Pt requesting refill for Enalopril 10 mg. Okay to fill?

## 2024-08-02 NOTE — Progress Notes (Addendum)
 WEIGHT SUMMARY AND BIOMETRICS  Vitals Temp: (!) 97.5 F (36.4 C) BP: 134/86 Pulse Rate: 89 SpO2: 94 %   Anthropometric Measurements Height: 5' 8 (1.727 m) Weight: (!) 377 lb (171 kg) BMI (Calculated): 57.34 Weight at Last Visit: 376 lb Weight Lost Since Last Visit: 0 lb Weight Gained Since Last Visit: 1 lb Starting Weight: 295 lb Total Weight Loss (lbs): 0 lb (0 kg)   Body Composition  Body Fat %: 59.3 % Fat Mass (lbs): 224 lbs Muscle Mass (lbs): 146 lbs Visceral Fat Rating : 22   Other Clinical Data Fasting: no Labs: no Today's Visit #: 51 Starting Date: 08/24/19    Chief Complaint:   OBESITY Ashley Acevedo is here to discuss her progress with her obesity treatment plan.  She is on the keeping a food journal and adhering to recommended goals of HEALTHY EATING calories and 90g+ protein and states she is following her eating plan approximately 60 % of the time.  She states she is exercising Walking and caring for her new born 45/all day minutes 2/7 times per week.  Interim History:  She is enjoying maternity leave, her son is 27 weeks old today. He is eating every 2 hrs during daytime and every 3 hrs during nighttime. He has been experiencing colic and reflux- he was recently evaluated at [facility] - no intervention needed. She is using her breast pump, able to produce 15 oz of breat milk a day. They have also been supplementation with formula. Her husband will join her at home on paternity leave the week prior to Thanksgiving to Jan 2026.  Reviewed Bioimpedance results with pt: Muscle Mass: +8.2 lbs Adipose Mass: -7.4 lbs  Subjective:   1. Vitamin D  deficiency  Latest Reference Range & Units 11/05/22 09:19 06/01/23 09:42 11/25/23 08:37  Vitamin D , 25-Hydroxy 30.0 - 100.0 ng/mL 17.7 (L) 37.5 26.5 (L)  (L): Data is abnormally low  Vit D level below goal at last check She is on daily Prenatal MVI  2. Elevated blood pressure reading PCP  recently adjusted her antihypertensive therapy She is currently on daily Enalapril 10mg  Bilateral lower extremity edema has resolved and BP today is stable at OV  3. Insulin  resistance  Latest Reference Range & Units 11/05/22 09:19 06/01/23 09:42 11/25/23 08:37  INSULIN  2.6 - 24.9 uIU/mL 39.0 (H) 29.6 (H) 31.7 (H)  (H): Data is abnormally high  OB/GYN has taken management of daily Metformin  XR 500mg  She would like to restart either Wegovy  or Zepbound   4. H/O cesarean section Hospital D/C Note Admitting diagnosis: Benign essential hypertension antepartum in third trimester [O10.013] Intrauterine pregnancy: [redacted]w[redacted]d     Secondary diagnosis:  Principal Problem:   Benign essential hypertension antepartum in third trimester   Additional problems: Chiari I malformation                          Discharge diagnosis: Term Pregnancy Delivered and CHTN                                              Post partum procedures:n/a Augmentation: N/A Complications: None   Hospital course: Sceduled C/S   28 y.o. yo G1P1001 at [redacted]w[redacted]d was admitted to the hospital 06/02/2024 for scheduled cesarean section with the following indication:Chiari I malformation.Delivery details are as follows:  Membrane Rupture  Time/Date:  ,   Delivery Method:C-Section, Vacuum Assisted   Details of operation can be found in separate operative note.  Patient had a postpartum course complicated by [facility] requiring titration of home nifedipine  XL to 90 mg with excellent control on day of discharge.  She is ambulating, tolerating a regular diet, passing flatus, and urinating well. Patient is discharged home in stable condition on  06/04/24  Assessment/Plan:   1. Vitamin D  deficiency (Primary) Continue daily Prenatal MVI  2. Elevated blood pressure reading Limit Na+ intake Continue to consume at least 90g protein and 100 oz water  per day.  3. Insulin  resistance Continue to consume at least 90g protein Call insurance and inquire  about coverage of Wegovy  or Zepbound  therapy  4. H/O cesarean section F/y with GYN care team as directed  5. Obesity, current BMI 57.4  Adam is currently in the action stage of change. As such, her goal is to continue with weight loss efforts. She has agreed to keeping a food journal and adhering to recommended goals of HEALTHY EATING  calories and 90g+ protein.   Exercise goals: No exercise has been prescribed at this time. All adults should avoid inactivity. Some physical activity is better than none, and adults who participate in any amount of physical activity gain some health benefits. Adults should also include muscle-strengthening activities that involve all major muscle groups on 2 or more days a week.  Behavioral modification strategies: increasing lean protein intake, decreasing simple carbohydrates, increasing vegetables, increasing water  intake, no skipping meals, meal planning and cooking strategies, keeping healthy foods in the home, ways to avoid boredom eating, and planning for success.  [name] has agreed to follow-up with our clinic in 4 weeks. She was informed of the importance of frequent follow-up visits to maximize her success with intensive lifestyle modifications for her multiple health conditions.   Objective:   Blood pressure 134/86, pulse 89, temperature (!) 97.5 F (36.4 C), height 5' 8 (1.727 m), weight (!) 377 lb (171 kg), last menstrual period 09/07/2023, SpO2 94%, currently breastfeeding. Body mass index is 57.32 kg/m.  General: Cooperative, alert, well developed, in no acute distress. HEENT: Conjunctivae and lids unremarkable. Cardiovascular: Regular rhythm.  Lungs: Normal work of breathing. Neurologic: No focal deficits.   Lab Results  Component Value Date   CREATININE 0.49 05/31/2024   BUN 5 (L) 05/31/2024   NA 135 05/31/2024   K 3.8 05/31/2024   CL 101 05/31/2024   CO2 22 05/31/2024   Lab Results  Component Value Date   ALT 11 05/31/2024    AST 12 (L) 05/31/2024   ALKPHOS 69 05/31/2024   BILITOT 0.6 05/31/2024   Lab Results  Component Value Date   HGBA1C 5.7 11/30/2023   HGBA1C 5.7 (H) 11/25/2023   HGBA1C 6.0 (H) 06/01/2023   HGBA1C 5.7 (H) 11/05/2022   HGBA1C 5.5 03/04/2022   Lab Results  Component Value Date   INSULIN  31.7 (H) 11/25/2023   INSULIN  29.6 (H) 06/01/2023   INSULIN  39.0 (H) 11/05/2022   INSULIN  17.7 03/04/2022   INSULIN  103.0 (H) 08/15/2021   Lab Results  Component Value Date   TSH 2.720 10/01/2022   Lab Results  Component Value Date   CHOL 208 (H) 06/01/2023   HDL 49 06/01/2023   LDLCALC 136 (H) 06/01/2023   TRIG 131 06/01/2023   CHOLHDL 4.2 06/01/2023   Lab Results  Component Value Date   VD25OH 26.5 (L) 11/25/2023   VD25OH 37.5 06/01/2023   VD25OH 17.7 (  L) 11/05/2022   Lab Results  Component Value Date   WBC 9.3 06/03/2024   HGB 10.0 (L) 06/03/2024   HCT 31.3 (L) 06/03/2024   MCV 86.0 06/03/2024   PLT 198 06/03/2024   Lab Results  Component Value Date   IRON 76 12/20/2020   TIBC 416 12/20/2020   FERRITIN 23 12/20/2020   Attestation Statements:   Reviewed by clinician on day of visit: allergies, medications, problem list, medical history, surgical history, family history, social history, and previous encounter notes.  Time spent on visit including pre-visit chart review and post-visit care and charting was 28 minutes.   I have reviewed the above documentation for accuracy and completeness, and I agree with the above. -  Kaylene Dawn d. Kaj Vasil, NP-C

## 2024-08-03 MED ORDER — ENALAPRIL MALEATE 10 MG PO TABS: 10.0000 mg | ORAL_TABLET | Freq: Every day | ORAL | 1 refills | Status: AC

## 2024-08-12 DIAGNOSIS — Z713 Dietary counseling and surveillance: Secondary | ICD-10-CM | POA: Diagnosis not present

## 2024-08-13 ENCOUNTER — Other Ambulatory Visit: Payer: Self-pay

## 2024-08-13 ENCOUNTER — Ambulatory Visit
Admission: RE | Admit: 2024-08-13 | Discharge: 2024-08-13 | Disposition: A | Discharge status: Home / Self Care | Attending: Physician Assistant | Admitting: Physician Assistant

## 2024-08-13 VITALS — BP 140/87 | HR 90 | Temp 98.3°F | Resp 19

## 2024-08-13 DIAGNOSIS — J302 Other seasonal allergic rhinitis: Secondary | ICD-10-CM | POA: Diagnosis not present

## 2024-08-13 DIAGNOSIS — J029 Acute pharyngitis, unspecified: Secondary | ICD-10-CM

## 2024-08-13 DIAGNOSIS — J4521 Mild intermittent asthma with (acute) exacerbation: Secondary | ICD-10-CM

## 2024-08-13 LAB — POCT RAPID STREP A (OFFICE): Rapid Strep A Screen: NEGATIVE

## 2024-08-13 NOTE — ED Provider Notes (Signed)
 [facility]    CSN: [id number] Arrival date & time: 08/13/24  1204      History   Chief Complaint Chief Complaint  Patient presents with   Sore Throat    4 weeks ago I had sinus and sore throat symptoms- thought allergies. Diagnosis with sinus infection 2 weeks ago. Sinus and throat got better. 2 days after antibiotic ended my throat started hurting again. That was the 22 or 233 - Entered by patient   Cough    HPI Syesha M Marsteller is a 28 y.o. female.  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), Allergies, Anemia, Anxiety, Asthma, B12 nutritional deficiency (01/23/2020), Back pain, Chiari malformation type I (HCC), Depression (01/23/2020), Fatigue (12/20/2020), GERD (gastroesophageal reflux disease), Heartburn, History of stomach ulcers, Hyperlipidemia, Joint pain, Mild persistent asthma without complication (11/02/2017), Mixed hyperlipidemia (06/12/2020), Nausea in adult (11/27/2021), Pollen-food allergy  (06/21/2021), Pregnancy induced hypertension, Seasonal allergic conjunctivitis (11/02/2017), Seasonal and perennial allergic rhinitis (11/02/2017), Vitamin B12 deficiency (03/11/2022), and Vitamin D  deficiency (01/23/2020).   HPI  Discussed the use of AI scribe software for clinical note transcription with the patient, who gave verbal consent to proceed.  The patient, with asthma, presents with a sore throat and dry cough.  The sore throat and dry cough began after completing a course of antibiotics for a sinus infection two weeks ago. The symptoms have persisted and are exacerbated by swallowing and coughing. The sore throat is described as 'super sore' and the cough disrupts their sleep. The patient reports no fever, chills, ear pain, sinus pain, or pressure.  They have a history of asthma, which is typically affected by seasonal changes, and they mention experiencing some wheezing but no shortness of breath. They have not used their rescue inhaler  recently. They are currently taking Zyrtec for allergies since February, after switching from Allegra during pregnancy, but have not been using Flonase. They are cautious about medication use due to breastfeeding.  They report some nasal congestion and drainage from both ears, but these symptoms have improved since the sinus infection was treated. No nausea, vomiting, diarrhea, body aches, rashes, headaches, dizziness, or itchy, watery eyes.  They are concerned about the impact of their symptoms on their ability to care for their two-month-old baby, as the cough disrupts their sleep. No one else in their household, including their husband, has similar symptoms.    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/2020   Allergies    Anemia    Anxiety    Asthma    B12 nutritional deficiency 01/23/2020   Back pain    Chiari malformation type I (HCC)    Depression 01/23/2020   Fatigue 12/20/2020   GERD (gastroesophageal reflux disease)    Heartburn    History of stomach ulcers    Hyperlipidemia    Joint pain    Mild persistent asthma without complication 11/02/2017   Mixed hyperlipidemia 06/12/2020   Nausea in adult 11/27/2021   Pollen-food allergy  06/21/2021   Pregnancy induced hypertension    Seasonal allergic conjunctivitis 11/02/2017   Seasonal and perennial allergic rhinitis 11/02/2017   Vitamin B12 deficiency 03/11/2022   Vitamin D  deficiency 01/23/2020    Patient Active Problem List   Diagnosis Date Noted   Benign essential hypertension antepartum in third trimester 06/02/2024   Obesity affecting pregnancy, antepartum 01/20/2024   Chiari I malformation (HCC) 12/02/2022   BMI 50.0-59.9, adult (HCC) 11/24/2022   At risk for osteoporosis 04/01/2022   Moderate  persistent asthma without complication 06/21/2021   Gastroesophageal reflux disease 06/21/2021   Insulin  resistance 01/23/2020   Class 3 severe obesity with serious comorbidity and body mass index  (BMI) of 50.0 to 59.9 in adult [facility]) 01/23/2020    Past Surgical History:  Procedure Laterality Date   CESAREAN SECTION N/A 06/02/2024   Procedure: CESAREAN DELIVERY;  Surgeon: Gretta Gums, MD;  Location: [facility] LD ORS;  Service: Obstetrics;  Laterality: N/A;   WISDOM TOOTH EXTRACTION      OB History     Gravida  1   Para  1   Term  1   Preterm  0   AB  0   Living  1      SAB  0   IAB  0   Ectopic  0   Multiple  0   Live Births  1            Home Medications    Prior to Admission medications   Medication Sig Start Date End Date Taking? Authorizing Provider  albuterol  (VENTOLIN  HFA) 108 (90 Base) MCG/ACT inhaler Inhale 2 puffs into the lungs every 4 (four) hours as needed for wheezing or shortness of breath. 09/20/21   Cari Arlean HERO, FNP  cetirizine (ZYRTEC) 10 MG tablet Take 10 mg by mouth daily.    [provider]  enalapril (VASOTEC) 10 MG tablet Take 1 tablet (10 mg total) by mouth daily. 08/03/24   Job Lukes, PA  escitalopram  (LEXAPRO ) 10 MG tablet Take 1 tablet (10 mg total) by mouth daily. 04/04/24   Danford, Katy D, NP  fluticasone (FLONASE) 50 MCG/ACT nasal spray Place 1 spray into both nostrils daily as needed for allergies.    [provider]  metFORMIN  (GLUCOPHAGE -XR) 500 MG 24 hr tablet Take 500 mg by mouth daily. 06/01/24   [provider]  nystatin  (MYCOSTATIN /NYSTOP ) powder Apply 1 Application topically 2 (two) times daily. 07/05/24   [provider]  pantoprazole  (PROTONIX ) 40 MG tablet TAKE 1 TABLET(40 MG) BY MOUTH DAILY 02/15/24   Danford, Katy D, NP  prenatal vitamin w/FE, FA (PRENATAL 1 + 1) 27-1 MG TABS tablet Take 1 tablet by mouth daily at 12 noon.    [provider]    Family History Family History  Problem Relation Age of Onset   Asthma Mother    Obesity Mother    Chiari malformation Mother    Hyperlipidemia Father    Atrial fibrillation Father        had ablation and is now in rhythm    Asthma Maternal Grandmother    Stomach cancer Maternal Grandmother    Chiari malformation Maternal Grandmother    Lung cancer Maternal Grandfather    Prostate cancer Maternal Grandfather    Colon cancer Maternal Grandfather        late 60's/early 70's   Alzheimer's disease Maternal Grandfather    Brain cancer Paternal Grandmother    Lung cancer Paternal Grandfather    Allergic rhinitis Neg Hx    Angioedema Neg Hx    Eczema Neg Hx    Immunodeficiency Neg Hx    Urticaria Neg Hx     Social History Social History   Tobacco Use   Smoking status: Never   Smokeless tobacco: Never  Vaping Use   Vaping status: Never Used  Substance Use Topics   Alcohol use: Not Currently    Comment: socially   Drug use: No     Allergies   Clindamycin, Codeine,  and Hydrocodone   Review of Systems Review of Systems  Constitutional:  Negative for chills and fever.  HENT:  Positive for ear discharge, postnasal drip and sore throat. Negative for congestion, ear pain, rhinorrhea, sinus pressure and sinus pain.   Eyes:  Negative for discharge and itching.  Respiratory:  Positive for cough and wheezing. Negative for shortness of breath.   Gastrointestinal:  Negative for diarrhea, nausea and vomiting.  Musculoskeletal:  Negative for myalgias.  Skin:  Negative for rash.  Neurological:  Negative for dizziness, light-headedness and headaches.     Physical Exam Triage Vital Signs ED Triage Vitals  Encounter Vitals Group     BP 08/13/24 1227 (!) 140/87     Girls Systolic BP Percentile --      Girls Diastolic BP Percentile --      Boys Systolic BP Percentile --      Boys Diastolic BP Percentile --      Pulse Rate 08/13/24 1227 90     Resp 08/13/24 1227 19     Temp 08/13/24 1227 98.3 F (36.8 C)     Temp Source 08/13/24 1227 Oral     SpO2 08/13/24 1227 94 %     Weight --      Height --      Head Circumference --      Peak Flow --      Pain Score 08/13/24 1226 2     Pain Loc --       Pain Education --      Exclude from Growth Chart --    No data found.  Updated Vital Signs BP (!) 140/87 (BP Location: Right Arm)   Pulse 90   Temp 98.3 F (36.8 C) (Oral)   Resp 19   LMP 09/07/2023 (Exact Date)   SpO2 94%   Breastfeeding Yes   Visual Acuity Right Eye Distance:   Left Eye Distance:   Bilateral Distance:    Right Eye Near:   Left Eye Near:    Bilateral Near:     Physical Exam Vitals reviewed.  Constitutional:      General: She is awake.     Appearance: Normal appearance. She is well-developed and well-groomed.  HENT:     Head: Normocephalic and atraumatic.     Right Ear: Hearing, tympanic membrane and ear canal normal.     Left Ear: Hearing, tympanic membrane and ear canal normal.     Mouth/Throat:     Lips: Pink.     Mouth: Mucous membranes are moist.     Pharynx: Oropharynx is clear. Uvula midline. No pharyngeal swelling, oropharyngeal exudate, posterior oropharyngeal erythema, uvula swelling or postnasal drip.  Cardiovascular:     Rate and Rhythm: Normal rate and regular rhythm.     Pulses: Normal pulses.          Radial pulses are 2+ on the right side and 2+ on the left side.     Heart sounds: Normal heart sounds. No murmur heard.    No friction rub. No gallop.  Pulmonary:     Effort: Pulmonary effort is normal.     Breath sounds: Normal breath sounds. No decreased air movement. No decreased breath sounds, wheezing, rhonchi or rales.  Musculoskeletal:     Cervical back: Normal range of motion and neck supple.  Lymphadenopathy:     Head:     Right side of head: No submental, submandibular or preauricular adenopathy.     Left side of head: No  submental, submandibular or preauricular adenopathy.     Cervical:     Right cervical: No superficial cervical adenopathy.    Left cervical: No superficial cervical adenopathy.     Upper Body:     Right upper body: No supraclavicular adenopathy.     Left upper body: No supraclavicular adenopathy.   Neurological:     Mental Status: She is alert.  Psychiatric:        Behavior: Behavior is cooperative.      UC Treatments / Results  Labs (all labs ordered are listed, but only abnormal results are displayed) Labs Reviewed  POCT RAPID STREP A (OFFICE)    EKG   Radiology No results found.  Procedures Procedures (including critical care time)  Medications Ordered in UC Medications - No data to display  Initial Impression / Assessment and Plan / UC Course  I have reviewed the triage vital signs and the nursing notes.  Pertinent labs & imaging results that were available during my care of the patient were reviewed by me and considered in my medical decision making (see chart for details).      Final Clinical Impressions(s) / UC Diagnoses   Final diagnoses:  Sore throat  Seasonal allergic rhinitis, unspecified trigger  Mild intermittent asthma with acute exacerbation   Acute pharyngitis with cough and postnasal drainage Sore throat and dry cough persisting for four weeks, likely due to postnasal drainage rather than bacterial infection. Negative strep test. Symptoms worsen with swallowing and coughing. No fever, chills, or significant nasal congestion. Mild wheezing present, likely related to asthma.  - Switch from Zyrtec to Claritin for a couple of months, then return to Zyrtec. - Use Flonase to help clear postnasal drainage. - Take rescue inhaler nightly to prevent nighttime coughing. - Use salt water  gargles and warm tea with honey for throat comfort. - Drink lidocaine  instead of gargling to coat the throat. - Consider chloraseptic spray for throat discomfort. - Alternate Tylenol  and ibuprofen  for pain management.  Allergic rhinitis Chronic allergic rhinitis managed with Zyrtec since February. Symptoms include postnasal drainage contributing to sore throat. Possible tolerance to current antihistamine noted. - Switch from Zyrtec to Claritin for a couple of months,  then return to Zyrtec. - Use Flonase to help clear postnasal drainage.  Asthma Mild wheezing likely exacerbated by seasonal allergies. No recent use of rescue inhaler. Symptoms include nighttime coughing, possibly related to postnasal drainage and allergies. - Take rescue inhaler nightly to prevent nighttime coughing.     Discharge Instructions      VISIT SUMMARY:  You came in today because of a sore throat and dry cough that started after finishing antibiotics for a sinus infection two weeks ago. You have a history of asthma and are currently taking Zyrtec for allergies. Your symptoms have been affecting your sleep and ability to care for your baby.  YOUR PLAN:  -ACUTE PHARYNGITIS WITH COUGH AND POSTNASAL DRAINAGE: Acute pharyngitis is an inflammation of the throat that can cause soreness and coughing. Your symptoms are likely due to postnasal drainage rather than a bacterial infection. You should switch from Zyrtec to Claritin for a couple of months, use Flonase to help clear postnasal drainage, and take your rescue inhaler nightly to prevent nighttime coughing. For throat comfort, use salt water  gargles, warm tea with honey, drink lidocaine  to coat your throat, and consider using chloraseptic spray. Alternate Tylenol  and ibuprofen  for pain management.  -ALLERGIC RHINITIS: Allergic rhinitis is an allergic reaction that causes sneezing, congestion,  and postnasal drainage. You should switch from Zyrtec to Claritin for a couple of months and use Flonase to help clear postnasal drainage.  -ASTHMA: Asthma is a condition that affects your airways and can cause wheezing and coughing. Your mild wheezing is likely due to seasonal allergies. You should take your rescue inhaler nightly to prevent nighttime coughing.  INSTRUCTIONS:  Please follow up if your symptoms do not improve or if they worsen. Continue to monitor your symptoms and use the medications as directed. If you have any concerns about  your medications while breastfeeding, please let us  know.     ED Prescriptions   None    PDMP not reviewed this encounter.   Marylene Rocky BRAVO, PA-C 08/13/24 1545

## 2024-08-13 NOTE — ED Triage Notes (Signed)
 Pt presents with complaints of dry cough and sore, red throat x 1.5 weeks. Diagnosed with sinus infection, finished Amoxicillin on 10/22. Symptoms initially improved with taking antibiotic however returned after last day of taking. Currently rates overall pain a 2/10. No OTC medications taken PTA for symptoms. Pt reports she is breastfeeding.

## 2024-08-13 NOTE — ED Notes (Addendum)
 Erin, PA pulled to an emergency. Pt was made aware and understanding of the delay in care.

## 2024-08-13 NOTE — Discharge Instructions (Addendum)
 VISIT SUMMARY:  You came in today because of a sore throat and dry cough that started after finishing antibiotics for a sinus infection two weeks ago. You have a history of asthma and are currently taking Zyrtec for allergies. Your symptoms have been affecting your sleep and ability to care for your baby.  YOUR PLAN:  -ACUTE PHARYNGITIS WITH COUGH AND POSTNASAL DRAINAGE: Acute pharyngitis is an inflammation of the throat that can cause soreness and coughing. Your symptoms are likely due to postnasal drainage rather than a bacterial infection. You should switch from Zyrtec to Claritin for a couple of months, use Flonase to help clear postnasal drainage, and take your rescue inhaler nightly to prevent nighttime coughing. For throat comfort, use salt water  gargles, warm tea with honey, drink lidocaine  to coat your throat, and consider using chloraseptic spray. Alternate Tylenol  and ibuprofen  for pain management.  -ALLERGIC RHINITIS: Allergic rhinitis is an allergic reaction that causes sneezing, congestion, and postnasal drainage. You should switch from Zyrtec to Claritin for a couple of months and use Flonase to help clear postnasal drainage.  -ASTHMA: Asthma is a condition that affects your airways and can cause wheezing and coughing. Your mild wheezing is likely due to seasonal allergies. You should take your rescue inhaler nightly to prevent nighttime coughing.  INSTRUCTIONS:  Please follow up if your symptoms do not improve or if they worsen. Continue to monitor your symptoms and use the medications as directed. If you have any concerns about your medications while breastfeeding, please let us  know.

## 2024-08-14 ENCOUNTER — Other Ambulatory Visit (INDEPENDENT_AMBULATORY_CARE_PROVIDER_SITE_OTHER): Payer: Self-pay | Admitting: Adult Health

## 2024-08-14 DIAGNOSIS — F3289 Other specified depressive episodes: Secondary | ICD-10-CM

## 2024-08-15 NOTE — Telephone Encounter (Signed)
 LAST APPOINTMENT DATE: 08/02/24 NEXT APPOINTMENT DATE: 09/01/24   [id number] - [place], [place] - [address] [place] [address] Phone: [phone] Fax: [phone]  Patient is requesting a refill of the following medications: Requested Prescriptions   Pending Prescriptions Disp Refills   escitalopram  (LEXAPRO ) 10 MG tablet [Pharmacy Med Name: ESCITALOPRAM  10 MG TABLET] 90 tablet 0    Sig: TAKE 1 TABLET BY MOUTH EVERY DAY    Date last filled: 04/04/24 Previously prescribed by Rockie Dalton, NP  Lab Results  Component Value Date   HGBA1C 5.7 11/30/2023   HGBA1C 5.7 (H) 11/25/2023   HGBA1C 6.0 (H) 06/01/2023   Lab Results  Component Value Date   LDLCALC 136 (H) 06/01/2023   CREATININE 0.49 05/31/2024   Lab Results  Component Value Date   VD25OH 26.5 (L) 11/25/2023   VD25OH 37.5 06/01/2023   VD25OH 17.7 (L) 11/05/2022    BP Readings from Last 3 Encounters:  08/13/24 (!) 140/87  08/02/24 134/86  07/18/24 122/80

## 2024-08-19 DIAGNOSIS — Z713 Dietary counseling and surveillance: Secondary | ICD-10-CM | POA: Diagnosis not present

## 2024-08-31 DIAGNOSIS — Z713 Dietary counseling and surveillance: Secondary | ICD-10-CM | POA: Diagnosis not present

## 2024-09-01 ENCOUNTER — Encounter (INDEPENDENT_AMBULATORY_CARE_PROVIDER_SITE_OTHER): Payer: Self-pay | Admitting: Adult Health

## 2024-09-01 ENCOUNTER — Ambulatory Visit (INDEPENDENT_AMBULATORY_CARE_PROVIDER_SITE_OTHER): Payer: Self-pay | Admitting: Adult Health

## 2024-09-01 VITALS — BP 123/74 | HR 95 | Temp 97.6°F | Ht 68.0 in | Wt 383.0 lb

## 2024-09-01 DIAGNOSIS — E88819 Insulin resistance, unspecified: Secondary | ICD-10-CM | POA: Diagnosis not present

## 2024-09-01 DIAGNOSIS — R030 Elevated blood-pressure reading, without diagnosis of hypertension: Secondary | ICD-10-CM | POA: Diagnosis not present

## 2024-09-01 DIAGNOSIS — E669 Obesity, unspecified: Secondary | ICD-10-CM | POA: Diagnosis not present

## 2024-09-01 DIAGNOSIS — Z6841 Body Mass Index (BMI) 40.0 and over, adult: Secondary | ICD-10-CM

## 2024-09-01 DIAGNOSIS — E559 Vitamin D deficiency, unspecified: Secondary | ICD-10-CM | POA: Diagnosis not present

## 2024-09-01 NOTE — Progress Notes (Signed)
 WEIGHT SUMMARY AND BIOMETRICS  Vitals Temp: 97.6 F (36.4 C) BP: 123/74 Pulse Rate: 95 SpO2: 99 %   Anthropometric Measurements Height: 5' 8 (1.727 m) Weight: (!) 383 lb (173.7 kg) BMI (Calculated): 58.25 Weight at Last Visit: 377lb Weight Lost Since Last Visit: 0lb Weight Gained Since Last Visit: 6lb Starting Weight: 295lb Total Weight Loss (lbs): 0 lb (0 kg)   Body Composition  Body Fat %: 59.4 % Fat Mass (lbs): 227.6 lbs Muscle Mass (lbs): 147.6 lbs Visceral Fat Rating : 23   Other Clinical Data Fasting: No Labs: No Today's Visit #: 30 Starting Date: 08/24/19    Chief Complaint:   OBESITY Ashley Acevedo is here to discuss her progress with her obesity treatment plan.  She is on the practicing portion control and making smarter food choices, such as increasing vegetables and decreasing simple carbohydrates and states she is following her eating plan approximately 60 % of the time.  She states she is exercising Walking 45-60 minutes 3 times per week.  Interim History:  She is pumping and producing 12 oz breast milk per day. Her son is also being supplemented with formula. Her son will be 61 months old tomorrow, 09/02/24  Reviewed Bioimpedance Results with pt: Muscle Mass: +1.6 lbs Adipose Mass: +3.6 lbs  She has not restarted Metformin  XR since d/c from hospital after cesearen section. She is agreeable to restart She denies family hx of MENS 2 or MTC She denies personal hx of pancreatitis She has not had a menses since childbirth on 06/02/2024  Subjective:   1. Vitamin D  deficiency  Latest Reference Range & Units 06/01/23 09:42 11/25/23 08:37  Vitamin D , 25-Hydroxy 30.0 - 100.0 ng/mL 37.5 26.5 (L)  (L): Data is abnormally low  Vit D level well below goal of 50-70  2. Elevated blood pressure reading PCP recently assumed management of Enalapril 10mg  BP stable at OV  3. Insulin  resistance She has not restarted Metformin  XR since d/c from hospital  after cesearen section. She is agreeable to restart She denies family hx of MENS 2 or MTC She denies personal hx of pancreatitis She has not had a menses since childbirth on 06/02/2024 She is interested in Wegovy  or Zepbound  therapy.  Assessment/Plan:   1. Vitamin D  deficiency (Primary) Monitor Labs  2. Elevated blood pressure reading Limit Na+ Continue healthy eating and regular exercise Continue Enalapril 10mg  per PCP  3. Insulin  resistance Restart  metFORMIN  (GLUCOPHAGE -XR) 500 MG 24 hr tablet Take 500 mg by mouth daily.  Contact GYN and inquire safety of Wegovy /Zepbound  during breast feeding 4. Obesity, current BMI 58.2  Ashley Acevedo is not currently in the action stage of change. As such, her goal is to get back to weightloss efforts . She has agreed to keeping a food journal and adhering to recommended goals of 1800 calories and 100g + protein.   Exercise goals: All adults should avoid inactivity. Some physical activity is better than none, and adults who participate in any amount of physical activity gain some health benefits. Adults should also include muscle-strengthening activities that involve all major muscle groups on 2 or more days a week.  Behavioral modification strategies: increasing lean protein intake, decreasing simple carbohydrates, increasing vegetables, increasing water  intake, no skipping meals, meal planning and cooking strategies, keeping healthy foods in the home, ways to avoid boredom eating, and planning for success.  Ashley Acevedo has agreed to follow-up with our clinic in 4 weeks. She was informed of the importance of frequent  follow-up visits to maximize her success with intensive lifestyle modifications for her multiple health conditions.   Objective:   Blood pressure 123/74, pulse 95, temperature 97.6 F (36.4 C), height 5' 8 (1.727 m), weight (!) 383 lb (173.7 kg), SpO2 99%, currently breastfeeding. Body mass index is 58.23 kg/m.  General: Cooperative,  alert, well developed, in no acute distress. HEENT: Conjunctivae and lids unremarkable. Cardiovascular: Regular rhythm.  Lungs: Normal work of breathing. Neurologic: No focal deficits.   Lab Results  Component Value Date   CREATININE 0.49 05/31/2024   BUN 5 (L) 05/31/2024   NA 135 05/31/2024   K 3.8 05/31/2024   CL 101 05/31/2024   CO2 22 05/31/2024   Lab Results  Component Value Date   ALT 11 05/31/2024   AST 12 (L) 05/31/2024   ALKPHOS 69 05/31/2024   BILITOT 0.6 05/31/2024   Lab Results  Component Value Date   HGBA1C 5.7 11/30/2023   HGBA1C 5.7 (H) 11/25/2023   HGBA1C 6.0 (H) 06/01/2023   HGBA1C 5.7 (H) 11/05/2022   HGBA1C 5.5 03/04/2022   Lab Results  Component Value Date   INSULIN  31.7 (H) 11/25/2023   INSULIN  29.6 (H) 06/01/2023   INSULIN  39.0 (H) 11/05/2022   INSULIN  17.7 03/04/2022   INSULIN  103.0 (H) 08/15/2021   Lab Results  Component Value Date   TSH 2.720 10/01/2022   Lab Results  Component Value Date   CHOL 208 (H) 06/01/2023   HDL 49 06/01/2023   LDLCALC 136 (H) 06/01/2023   TRIG 131 06/01/2023   CHOLHDL 4.2 06/01/2023   Lab Results  Component Value Date   VD25OH 26.5 (L) 11/25/2023   VD25OH 37.5 06/01/2023   VD25OH 17.7 (L) 11/05/2022   Lab Results  Component Value Date   WBC 9.3 06/03/2024   HGB 10.0 (L) 06/03/2024   HCT 31.3 (L) 06/03/2024   MCV 86.0 06/03/2024   PLT 198 06/03/2024   Lab Results  Component Value Date   IRON 76 12/20/2020   TIBC 416 12/20/2020   FERRITIN 23 12/20/2020   Attestation Statements:   Reviewed by clinician on day of visit: allergies, medications, problem list, medical history, surgical history, family history, social history, and previous encounter notes.  I have reviewed the above documentation for accuracy and completeness, and I agree with the above. -  Avelardo Reesman d. Oluwademilade Mckiver, NP-C

## 2024-09-12 ENCOUNTER — Encounter (INDEPENDENT_AMBULATORY_CARE_PROVIDER_SITE_OTHER): Payer: Self-pay | Admitting: Adult Health

## 2024-09-14 ENCOUNTER — Encounter (INDEPENDENT_AMBULATORY_CARE_PROVIDER_SITE_OTHER): Payer: Self-pay | Admitting: Adult Health

## 2024-09-14 ENCOUNTER — Telehealth (INDEPENDENT_AMBULATORY_CARE_PROVIDER_SITE_OTHER): Payer: Self-pay

## 2024-09-14 ENCOUNTER — Other Ambulatory Visit (INDEPENDENT_AMBULATORY_CARE_PROVIDER_SITE_OTHER): Payer: Self-pay | Admitting: Adult Health

## 2024-09-14 MED ORDER — ZEPBOUND 2.5 MG/0.5ML ~~LOC~~ SOAJ: 2.5000 mg | SUBCUTANEOUS | 0 refills | Status: DC

## 2024-09-14 NOTE — Telephone Encounter (Signed)
 Ashley Acevedo  (KeyBETHA DASH) Rx #: [id number] Zepbound  2.5MG /0.5ML pen-injectors

## 2024-09-14 NOTE — Telephone Encounter (Signed)
 Message from Plan CaseId:[id number]; Status:Approved; Review Type:Prior Auth; Coverage Start Date:08/15/2024; Coverage End Date:05/12/2025;.  Authorization Expiration Date: May 12, 2025.

## 2024-09-27 ENCOUNTER — Ambulatory Visit: Admitting: Physician Assistant

## 2024-09-27 ENCOUNTER — Encounter: Payer: Self-pay | Admitting: Physician Assistant

## 2024-09-27 VITALS — BP 128/86 | HR 93 | Temp 97.9°F | Ht 68.0 in | Wt 393.2 lb

## 2024-09-27 DIAGNOSIS — M25532 Pain in left wrist: Secondary | ICD-10-CM | POA: Diagnosis not present

## 2024-09-27 MED ORDER — MELOXICAM 15 MG PO TABS: ORAL_TABLET | ORAL | 0 refills | Status: AC

## 2024-09-27 NOTE — Patient Instructions (Signed)
°  VISIT SUMMARY: You visited us  today due to wrist pain on the pinky side of your wrist, which has been ongoing for at least two weeks and is now radiating up your forearm and into your hand. The pain worsens with lifting, especially when picking up your baby or lifting car seats, and you are currently unable to lift with the affected hand.  YOUR PLAN: WRIST TENDINOPATHY: You have pain on the ulnar side of your wrist with swelling, which worsens with lifting and repetitive motion. This may be due to tendinitis. -Apply topical Voltaren gel 2-4 times daily. -Consider using a wrist brace or over-the-counter sleeve for immobilization if it helps. -You have been referred to Sports Medicine for further evaluation, including a possible x-ray and ultrasound. -Take meloxicam  as prescribed for a one-week course, then as needed. -Follow the exercise handouts provided for strengthening if your symptoms improve.                      Contains text generated by Abridge.                                 Contains text generated by Abridge.

## 2024-09-27 NOTE — Progress Notes (Signed)
 History of Present Illness:   Chief Complaint  Patient presents with   Wrist Pain    Pt c/o left wrist pain x 2 weeks, pain when picking things and rotating wrist. Has tried Ibuprofen  and Tylenol .    Discussed the use of AI scribe software for clinical note transcription with the patient, who gave verbal consent to proceed.  History of Present Illness   Ashley Acevedo is a 28 year old female who presents with wrist pain on the pinky side of her wrist.  She has had ulnar-sided wrist pain for at least two weeks, now radiating up her forearm and into her hand. The pain worsens with lifting, especially when picking up her baby or lifting car seats, and she is currently unable to lift with the affected hand.  She gets temporary relief with ibuprofen , Tylenol , and icing. She has not tried a splint or brace. She notes some swelling and pain with wrist flexion, extension, and rotation. She denies trauma or falls.  She is right-handed, currently not working, and plans to return to a typing-based job in January, and is concerned that work may worsen her pain. She has a four-month-old baby and denies shooting pain on the thumb side of the wrist.        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/2020   Allergies    Anemia    Anxiety    Asthma    B12 nutritional deficiency 01/23/2020   Back pain    Chiari malformation type I (HCC)    Depression 01/23/2020   Fatigue 12/20/2020   GERD (gastroesophageal reflux disease)    Heartburn    History of stomach ulcers    Hyperlipidemia    Joint pain    Mild persistent asthma without complication 11/02/2017   Mixed hyperlipidemia 06/12/2020   Nausea in adult 11/27/2021   Pollen-food allergy  06/21/2021   Pregnancy induced hypertension    Seasonal allergic conjunctivitis 11/02/2017   Seasonal and perennial allergic rhinitis 11/02/2017   Vitamin B12 deficiency 03/11/2022   Vitamin D  deficiency 01/23/2020     Social  History[1]  Past Surgical History:  Procedure Laterality Date   CESAREAN SECTION N/A 06/02/2024   Procedure: CESAREAN DELIVERY;  Surgeon: Gretta Gums, MD;  Location: [facility];  Service: Obstetrics;  Laterality: N/A;   WISDOM TOOTH EXTRACTION      Family History  Problem Relation Age of Onset   Asthma Mother    Obesity Mother    Chiari malformation Mother    Hyperlipidemia Father    Atrial fibrillation Father        had ablation and is now in rhythm   Asthma Maternal Grandmother    Stomach cancer Maternal Grandmother    Chiari malformation Maternal Grandmother    Lung cancer Maternal Grandfather    Prostate cancer Maternal Grandfather    Colon cancer Maternal Grandfather        late 60's/early 70's   Alzheimer's disease Maternal Grandfather    Brain cancer Paternal Grandmother    Lung cancer Paternal Grandfather    Allergic rhinitis Neg Hx    Angioedema Neg Hx    Eczema Neg Hx    Immunodeficiency Neg Hx    Urticaria Neg Hx     Allergies[2]  Current Medications:  Current Medications[3]   Review of Systems:   Negative unless otherwise specified per HPI.  Vitals:   Vitals:   09/27/24 1329  BP: 128/86  Pulse: 93  Temp: 97.9 F (36.6 C)  TempSrc: Temporal  SpO2: 96%  Weight: (!) 393 lb 4 oz (178.4 kg)  Height: 5' 8 (1.727 m)     Body mass index is 59.79 kg/m.  Physical Exam:   Physical Exam Constitutional:      Appearance: Normal appearance. She is well-developed.  HENT:     Head: Normocephalic and atraumatic.  Eyes:     General: Lids are normal.     Extraocular Movements: Extraocular movements intact.     Conjunctiva/sclera: Conjunctivae normal.  Pulmonary:     Effort: Pulmonary effort is normal.  Musculoskeletal:        General: Normal range of motion.     Cervical back: Normal range of motion and neck supple.     Comments: Ulnar aspect of left wrist with tenderness to palpation  Pain elicited with extension and flexion of wrist    Skin:     General: Skin is warm and dry.  Neurological:     Mental Status: She is alert and oriented to person, place, and time.  Psychiatric:        Attention and Perception: Attention and perception normal.        Mood and Affect: Mood normal.        Behavior: Behavior normal.        Thought Content: Thought content normal.        Judgment: Judgment normal.     Assessment and Plan:   Assessment and Plan    Left wrist pain Pain on the ulnar side of the wrist with swelling, exacerbated by lifting and repetitive motion. Ibuprofen  and acetaminophen  provide temporary relief. - Recommended topical Voltaren gel, apply 2-4 times daily. - Consider wrist brace or over-the-counter sleeve for immobilization if beneficial. - Referred to Sports Medicine for further evaluation, including possible x-ray and ultrasound. - Prescribed meloxicam  for a one-week course, then as needed. - Provided exercise handouts for strengthening if symptoms improve.         Lucie Buttner, PA-C    [1]  Social History Tobacco Use   Smoking status: Never   Smokeless tobacco: Never  Vaping Use   Vaping status: Never Used  Substance Use Topics   Alcohol use: Not Currently    Comment: socially   Drug use: No  [2]  Allergies Allergen Reactions   Clindamycin Rash   Codeine Rash   Hydrocodone Rash  [3]  Current Outpatient Medications:    albuterol  (VENTOLIN  HFA) 108 (90 Base) MCG/ACT inhaler, Inhale 2 puffs into the lungs every 4 (four) hours as needed for wheezing or shortness of breath., Disp: 18 g, Rfl: 1   cetirizine (ZYRTEC) 10 MG tablet, Take 10 mg by mouth daily., Disp: , Rfl:    enalapril  (VASOTEC ) 10 MG tablet, Take 1 tablet (10 mg total) by mouth daily., Disp: 90 tablet, Rfl: 1   escitalopram  (LEXAPRO ) 10 MG tablet, TAKE 1 TABLET BY MOUTH EVERY DAY, Disp: 90 tablet, Rfl: 0   fluticasone (FLONASE) 50 MCG/ACT nasal spray, Place 1 spray into both nostrils daily as needed for allergies., Disp: , Rfl:     meloxicam  (MOBIC ) 15 MG tablet, Take daily x 1 week, then daily as needed. Do not take additional NSAIDs while taking this medication., Disp: 30 tablet, Rfl: 0   metFORMIN  (GLUCOPHAGE -XR) 500 MG 24 hr tablet, Take 500 mg by mouth daily., Disp: , Rfl:    pantoprazole  (PROTONIX ) 40 MG tablet, TAKE 1 TABLET(40 MG) BY MOUTH DAILY, Disp: 90 tablet,  Rfl: 0   prenatal vitamin w/FE, FA (PRENATAL 1 + 1) 27-1 MG TABS tablet, Take 1 tablet by mouth daily at 12 noon., Disp: , Rfl:    tirzepatide  (ZEPBOUND ) 2.5 MG/0.5ML Pen, Inject 2.5 mg into the skin once a week., Disp: 2 mL, Rfl: 0

## 2024-10-10 ENCOUNTER — Ambulatory Visit (INDEPENDENT_AMBULATORY_CARE_PROVIDER_SITE_OTHER): Admitting: Adult Health

## 2024-10-25 ENCOUNTER — Encounter (INDEPENDENT_AMBULATORY_CARE_PROVIDER_SITE_OTHER): Payer: Self-pay | Admitting: Adult Health

## 2024-10-26 ENCOUNTER — Encounter (INDEPENDENT_AMBULATORY_CARE_PROVIDER_SITE_OTHER): Payer: Self-pay | Admitting: Adult Health

## 2024-10-26 ENCOUNTER — Other Ambulatory Visit (INDEPENDENT_AMBULATORY_CARE_PROVIDER_SITE_OTHER): Payer: Self-pay | Admitting: Adult Health

## 2024-10-26 DIAGNOSIS — Z6841 Body Mass Index (BMI) 40.0 and over, adult: Secondary | ICD-10-CM

## 2024-10-26 MED ORDER — ZEPBOUND 5 MG/0.5ML ~~LOC~~ SOAJ: 5.0000 mg | SUBCUTANEOUS | 0 refills | Status: DC

## 2024-10-26 NOTE — Telephone Encounter (Signed)
 Please advise

## 2024-11-04 ENCOUNTER — Encounter: Admitting: Physician Assistant

## 2024-11-08 ENCOUNTER — Telehealth (INDEPENDENT_AMBULATORY_CARE_PROVIDER_SITE_OTHER): Payer: Self-pay

## 2024-11-08 ENCOUNTER — Other Ambulatory Visit (INDEPENDENT_AMBULATORY_CARE_PROVIDER_SITE_OTHER): Payer: Self-pay | Admitting: Adult Health

## 2024-11-08 DIAGNOSIS — E66813 Obesity, class 3: Secondary | ICD-10-CM

## 2024-11-08 NOTE — Telephone Encounter (Signed)
I called pt. No answer, left a message asking pt to call me back.   

## 2024-11-08 NOTE — Telephone Encounter (Signed)
 Pa Started [employer] (Key: [id number]) Zepbound  5MG /0.5ML pen-injectors

## 2024-11-10 ENCOUNTER — Ambulatory Visit (INDEPENDENT_AMBULATORY_CARE_PROVIDER_SITE_OTHER): Admitting: Adult Health

## 2024-11-10 ENCOUNTER — Encounter (INDEPENDENT_AMBULATORY_CARE_PROVIDER_SITE_OTHER): Payer: Self-pay | Admitting: Adult Health

## 2024-11-10 VITALS — BP 105/69 | HR 97 | Temp 98.4°F | Ht 68.0 in | Wt 389.0 lb

## 2024-11-10 DIAGNOSIS — E669 Obesity, unspecified: Secondary | ICD-10-CM

## 2024-11-10 DIAGNOSIS — E88819 Insulin resistance, unspecified: Secondary | ICD-10-CM

## 2024-11-10 DIAGNOSIS — E559 Vitamin D deficiency, unspecified: Secondary | ICD-10-CM | POA: Diagnosis not present

## 2024-11-10 DIAGNOSIS — R030 Elevated blood-pressure reading, without diagnosis of hypertension: Secondary | ICD-10-CM | POA: Diagnosis not present

## 2024-11-10 DIAGNOSIS — K219 Gastro-esophageal reflux disease without esophagitis: Secondary | ICD-10-CM

## 2024-11-10 DIAGNOSIS — Z6841 Body Mass Index (BMI) 40.0 and over, adult: Secondary | ICD-10-CM

## 2024-11-10 MED ORDER — ZEPBOUND 5 MG/0.5ML ~~LOC~~ SOAJ: 5.0000 mg | SUBCUTANEOUS | 0 refills | Status: AC

## 2024-11-10 MED ORDER — METFORMIN HCL ER 500 MG PO TB24: 500.0000 mg | ORAL_TABLET | Freq: Every day | ORAL | 0 refills | Status: AC

## 2024-11-10 NOTE — Progress Notes (Signed)
 "    WEIGHT SUMMARY AND BIOMETRICS  No data recorded Anthropometric Measurements Height: 5' 8 (1.727 m) Weight at Last Visit: 383lb Starting Weight: 295lb   No data recorded Other Clinical Data Fasting: No Labs: No Today's Visit #: 21 Starting Date: 08/24/19    Chief Complaint:   OBESITY Ashley Acevedo is here to discuss her progress with her obesity treatment plan.  She is on the keeping a food journal and adhering to recommended goals of 1800 calories and 100 protein and states she is following her eating plan approximately 0 % of the time.  She states she is exercising Walking 30-60 minutes 3 times per week.  Interim History:  She started Zepbound  late dec 2025 He has had 4 doses Denies mass in neck, dysphagia, dyspepsia, persistent hoarseness, abdominal pain, or N/V/C  Due to delay in Prior Authorization (PA), she has not received Zepbound  5mg  She has been off weekly GIP/GLP-1 therapy for 2-3 weeks Will submit Rx and PA again- f/u with pt by [facility] today  Her son, Dusty is now 68 months old- simply thriving She and her husband are both back at work FT- Dusty is cared for by both sets of grandparents  Subjective:   1. Elevated blood pressure reading BP stable at OV She denies CP with exertion She was on enalapril  (VASOTEC ) 10 MG tablet during pregnancy- not currently on any antihypertensive therapy at present  2. IR She has restarted daily Metformin  XR 500mg - usually takes with first meal She started Zepbound  late dec 2025 He has had 4 doses Denies mass in neck, dysphagia, dyspepsia, persistent hoarseness, abdominal pain, or N/V/C  Due to delay in Prior Authorization (PA), she has not received Zepbound  5mg  She has been off weekly GIP/GLP-1 therapy for 2-3 weeks Will submit Rx and PA again- f/u with pt by [facility] today  3. Gastroesophageal reflux disease, unspecified whether esophagitis present She reports GERD sx's are well controlled She takes daily Protonix  40mg   4.  Vitamin D  deficiency  Latest Reference Range & Units 11/05/22 09:19 06/01/23 09:42 11/25/23 08:37  Vitamin D , 25-Hydroxy 30.0 - 100.0 ng/mL 17.7 (L) 37.5 26.5 (L)  (L): Data is abnormally low  She is on prenatal MVI daily  Assessment/Plan:   1. Elevated blood pressure reading (Primary) Limit Na+ intake Continue regular walking  2. IR Refill metFORMIN  (GLUCOPHAGE -XR) 500 MG 24 hr tablet Take 1 tablet (500 mg total) by mouth daily. Dispense: 90 tablet, Refills: 0 ordered  Refill and INCREASE  tirzepatide  (ZEPBOUND ) 5 MG/0.5ML Pen Inject 5 mg into the skin once a week. Dispense: 2 mL, Refills: 0 ordered   3. Gastroesophageal reflux disease, unspecified whether esophagitis present Avoid known trigger foods Do not overeat or too close to bedtime Continue PPI therapy  4. Vitamin D  deficiency Monitor labs and continue daily supplementation  5. BMI 50.0-59.9, adult (HCC) Refill and INCREASE  tirzepatide  (ZEPBOUND ) 5 MG/0.5ML Pen Inject 5 mg into the skin once a week. Dispense: 2 mL, Refills: 0 ordered   Ashley Acevedo is currently in the action stage of change. As such, her goal is to continue with weight loss efforts. She has agreed to keeping a food journal and adhering to recommended goals of 1800 calories and 100g protein.   Exercise goals: All adults should avoid inactivity. Some physical activity is better than none, and adults who participate in any amount of physical activity gain some health benefits. Adults should also include muscle-strengthening activities that involve all major muscle groups on 2 or  more days a week. Walk Daily  Behavioral modification strategies: increasing lean protein intake, decreasing simple carbohydrates, increasing vegetables, increasing water  intake, no skipping meals, meal planning and cooking strategies, keeping healthy foods in the home, ways to avoid boredom eating, better snacking choices, and planning for success.  Anishka has agreed to follow-up with  our clinic in 4 weeks. She was informed of the importance of frequent follow-up visits to maximize her success with intensive lifestyle modifications for her multiple health conditions.   Check Fasting Labs at next OV  Objective:   Height 5' 8 (1.727 m), not currently breastfeeding. Body mass index is 59.79 kg/m.  General: Cooperative, alert, well developed, in no acute distress. HEENT: Conjunctivae and lids unremarkable. Cardiovascular: Regular rhythm.  Lungs: Normal work of breathing. Neurologic: No focal deficits.   Lab Results  Component Value Date   CREATININE 0.49 05/31/2024   BUN 5 (L) 05/31/2024   NA 135 05/31/2024   K 3.8 05/31/2024   CL 101 05/31/2024   CO2 22 05/31/2024   Lab Results  Component Value Date   ALT 11 05/31/2024   AST 12 (L) 05/31/2024   ALKPHOS 69 05/31/2024   BILITOT 0.6 05/31/2024   Lab Results  Component Value Date   HGBA1C 5.7 11/30/2023   HGBA1C 5.7 (H) 11/25/2023   HGBA1C 6.0 (H) 06/01/2023   HGBA1C 5.7 (H) 11/05/2022   HGBA1C 5.5 03/04/2022   Lab Results  Component Value Date   INSULIN  31.7 (H) 11/25/2023   INSULIN  29.6 (H) 06/01/2023   INSULIN  39.0 (H) 11/05/2022   INSULIN  17.7 03/04/2022   INSULIN  103.0 (H) 08/15/2021   Lab Results  Component Value Date   TSH 2.720 10/01/2022   Lab Results  Component Value Date   CHOL 208 (H) 06/01/2023   HDL 49 06/01/2023   LDLCALC 136 (H) 06/01/2023   TRIG 131 06/01/2023   CHOLHDL 4.2 06/01/2023   Lab Results  Component Value Date   VD25OH 26.5 (L) 11/25/2023   VD25OH 37.5 06/01/2023   VD25OH 17.7 (L) 11/05/2022   Lab Results  Component Value Date   WBC 9.3 06/03/2024   HGB 10.0 (L) 06/03/2024   HCT 31.3 (L) 06/03/2024   MCV 86.0 06/03/2024   PLT 198 06/03/2024   Lab Results  Component Value Date   IRON 76 12/20/2020   TIBC 416 12/20/2020   FERRITIN 23 12/20/2020   Attestation Statements:   Reviewed by clinician on day of visit: allergies, medications, problem list,  medical history, surgical history, family history, social history, and previous encounter notes.  I have reviewed the above documentation for accuracy and completeness, and I agree with the above. -  Ashley Acevedo d. Ashley Oman, NP-C "

## 2024-11-11 ENCOUNTER — Encounter (INDEPENDENT_AMBULATORY_CARE_PROVIDER_SITE_OTHER): Payer: Self-pay | Admitting: Adult Health

## 2024-11-11 NOTE — Telephone Encounter (Addendum)
 Message from Plan: Message from [employer]: Drug is not covered by plan.  CoverMyMeds Recommendation This medication may be excluded from the patient's benefit. For more information, please reach out to [employer] directly at [phone]  Patient has been notified via my chart.

## 2024-12-12 ENCOUNTER — Ambulatory Visit (INDEPENDENT_AMBULATORY_CARE_PROVIDER_SITE_OTHER): Admitting: Physician Assistant
